# Patient Record
Sex: Female | Born: 1945 | Race: White | Hispanic: No | Marital: Married | Smoking: Never smoker
Health system: Southern US, Community
[De-identification: ages and names within clinical notes are randomized; demographics above are authoritative.]

## PROBLEM LIST (undated history)

## (undated) DIAGNOSIS — C801 Malignant (primary) neoplasm, unspecified: Secondary | ICD-10-CM

## (undated) DIAGNOSIS — M810 Age-related osteoporosis without current pathological fracture: Secondary | ICD-10-CM

## (undated) DIAGNOSIS — D649 Anemia, unspecified: Secondary | ICD-10-CM

## (undated) DIAGNOSIS — E785 Hyperlipidemia, unspecified: Secondary | ICD-10-CM

## (undated) DIAGNOSIS — L570 Actinic keratosis: Secondary | ICD-10-CM

## (undated) DIAGNOSIS — I10 Essential (primary) hypertension: Secondary | ICD-10-CM

## (undated) DIAGNOSIS — H409 Unspecified glaucoma: Secondary | ICD-10-CM

## (undated) HISTORY — DX: Unspecified glaucoma: H40.9

## (undated) HISTORY — DX: Age-related osteoporosis without current pathological fracture: M81.0

## (undated) HISTORY — PX: OTHER SURGICAL HISTORY: SHX169

## (undated) HISTORY — DX: Malignant (primary) neoplasm, unspecified: C80.1

## (undated) HISTORY — DX: Actinic keratosis: L57.0

## (undated) HISTORY — PX: COLONOSCOPY: SHX174

---

## 1970-08-18 HISTORY — PX: BREAST EXCISIONAL BIOPSY: SUR124

## 1978-08-18 HISTORY — PX: BREAST EXCISIONAL BIOPSY: SUR124

## 2004-05-27 ENCOUNTER — Ambulatory Visit: Payer: Self-pay | Admitting: Unknown Physician Specialty

## 2004-05-29 ENCOUNTER — Ambulatory Visit: Payer: Self-pay | Admitting: Unknown Physician Specialty

## 2004-12-25 ENCOUNTER — Ambulatory Visit: Payer: Self-pay | Admitting: Gynecologic Oncology

## 2005-05-30 ENCOUNTER — Ambulatory Visit: Payer: Self-pay | Admitting: Unknown Physician Specialty

## 2006-07-17 ENCOUNTER — Ambulatory Visit: Payer: Self-pay | Admitting: Unknown Physician Specialty

## 2007-07-28 ENCOUNTER — Ambulatory Visit: Payer: Self-pay | Admitting: Unknown Physician Specialty

## 2007-12-03 ENCOUNTER — Ambulatory Visit: Payer: Self-pay | Admitting: Unknown Physician Specialty

## 2007-12-08 DIAGNOSIS — C4492 Squamous cell carcinoma of skin, unspecified: Secondary | ICD-10-CM

## 2007-12-08 HISTORY — DX: Squamous cell carcinoma of skin, unspecified: C44.92

## 2007-12-23 DIAGNOSIS — C4491 Basal cell carcinoma of skin, unspecified: Secondary | ICD-10-CM

## 2007-12-23 HISTORY — DX: Basal cell carcinoma of skin, unspecified: C44.91

## 2008-07-28 ENCOUNTER — Ambulatory Visit: Payer: Self-pay | Admitting: Unknown Physician Specialty

## 2009-07-31 ENCOUNTER — Ambulatory Visit: Payer: Self-pay | Admitting: Unknown Physician Specialty

## 2010-04-09 ENCOUNTER — Ambulatory Visit: Payer: Self-pay | Admitting: Internal Medicine

## 2010-08-13 ENCOUNTER — Ambulatory Visit: Payer: Self-pay | Admitting: Unknown Physician Specialty

## 2011-08-20 ENCOUNTER — Ambulatory Visit: Payer: Self-pay | Admitting: Unknown Physician Specialty

## 2012-08-20 ENCOUNTER — Ambulatory Visit: Payer: Self-pay | Admitting: Internal Medicine

## 2013-08-22 ENCOUNTER — Ambulatory Visit: Payer: Self-pay | Admitting: Internal Medicine

## 2014-09-11 ENCOUNTER — Ambulatory Visit: Payer: Self-pay | Admitting: Internal Medicine

## 2015-04-06 ENCOUNTER — Encounter: Payer: Self-pay | Admitting: *Deleted

## 2015-04-09 ENCOUNTER — Encounter: Payer: Self-pay | Admitting: *Deleted

## 2015-04-09 ENCOUNTER — Encounter
Admission: RE | Disposition: A | Discharge status: Home / Self Care | Payer: Self-pay | Source: Ambulatory Visit | Attending: Unknown Physician Specialty

## 2015-04-09 ENCOUNTER — Ambulatory Visit: Payer: PPO | Admitting: Anesthesiology

## 2015-04-09 ENCOUNTER — Ambulatory Visit
Admission: RE | Admit: 2015-04-09 | Discharge: 2015-04-09 | Disposition: A | Discharge status: Home / Self Care | Payer: PPO | Source: Ambulatory Visit | Attending: Unknown Physician Specialty | Admitting: Unknown Physician Specialty

## 2015-04-09 DIAGNOSIS — R011 Cardiac murmur, unspecified: Secondary | ICD-10-CM | POA: Insufficient documentation

## 2015-04-09 DIAGNOSIS — D510 Vitamin B12 deficiency anemia due to intrinsic factor deficiency: Secondary | ICD-10-CM | POA: Insufficient documentation

## 2015-04-09 DIAGNOSIS — K2950 Unspecified chronic gastritis without bleeding: Secondary | ICD-10-CM | POA: Diagnosis not present

## 2015-04-09 DIAGNOSIS — E785 Hyperlipidemia, unspecified: Secondary | ICD-10-CM | POA: Insufficient documentation

## 2015-04-09 DIAGNOSIS — K449 Diaphragmatic hernia without obstruction or gangrene: Secondary | ICD-10-CM | POA: Diagnosis not present

## 2015-04-09 DIAGNOSIS — K317 Polyp of stomach and duodenum: Secondary | ICD-10-CM | POA: Diagnosis not present

## 2015-04-09 DIAGNOSIS — Z79899 Other long term (current) drug therapy: Secondary | ICD-10-CM | POA: Insufficient documentation

## 2015-04-09 DIAGNOSIS — I10 Essential (primary) hypertension: Secondary | ICD-10-CM | POA: Diagnosis not present

## 2015-04-09 HISTORY — DX: Essential (primary) hypertension: I10

## 2015-04-09 HISTORY — DX: Anemia, unspecified: D64.9

## 2015-04-09 HISTORY — DX: Hyperlipidemia, unspecified: E78.5

## 2015-04-09 HISTORY — PX: ESOPHAGOGASTRODUODENOSCOPY (EGD) WITH PROPOFOL: SHX5813

## 2015-04-09 SURGERY — ESOPHAGOGASTRODUODENOSCOPY (EGD) WITH PROPOFOL
Anesthesia: General

## 2015-04-09 MED ORDER — PROPOFOL 10 MG/ML IV BOLUS
INTRAVENOUS | Status: DC | PRN
  Administered 2015-04-09: 50 mg via INTRAVENOUS

## 2015-04-09 MED ORDER — SODIUM CHLORIDE 0.9 % IV SOLN
INTRAVENOUS | Status: DC
  Administered 2015-04-09: 1000 mL via INTRAVENOUS

## 2015-04-09 MED ORDER — SODIUM CHLORIDE 0.9 % IV SOLN: INTRAVENOUS | Status: DC

## 2015-04-09 MED ORDER — PROPOFOL INFUSION 10 MG/ML OPTIME
INTRAVENOUS | Status: DC | PRN
  Administered 2015-04-09: 100 ug/kg/min via INTRAVENOUS

## 2015-04-09 NOTE — H&P (Signed)
   Primary Care Physician:  Lavera Guise, MD Primary Gastroenterologist:  Dr. Vira Agar  Pre-Procedure History & Physical: HPI:  Joann Warren is a 69 y.o. female is here for an endoscopy.   Past Medical History  Diagnosis Date  . Anemia   . Hypertension   . Hyperlipidemia     Past Surgical History  Procedure Laterality Date  . Colonoscopy      Prior to Admission medications   Medication Sig Start Date End Date Taking? Authorizing Provider  amLODipine (NORVASC) 5 MG tablet Take 5 mg by mouth daily.   Yes Historical Provider, MD  bimatoprost (LATISSE) 0.03 % ophthalmic solution Place into both eyes at bedtime. Place one drop on applicator and apply evenly along the skin of the upper eyelid at base of eyelashes once daily at bedtime; repeat procedure for second eye (use a clean applicator).   Yes Historical Provider, MD  bimatoprost (LUMIGAN) 0.03 % ophthalmic solution 1 drop at bedtime.   Yes Historical Provider, MD  bisoprolol-hydrochlorothiazide (ZIAC) 2.5-6.25 MG per tablet Take 1 tablet by mouth daily.   Yes Historical Provider, MD  ibandronate (BONIVA) 150 MG tablet Take 150 mg by mouth every 30 (thirty) days. Take in the morning with a full glass of water, on an empty stomach, and do not take anything else by mouth or lie down for the next 30 min.   Yes Historical Provider, MD  simvastatin (ZOCOR) 20 MG tablet Take 20 mg by mouth daily.   Yes Historical Provider, MD  vitamin B-12 (CYANOCOBALAMIN) 1000 MCG tablet Take 1,000 mcg by mouth daily.   Yes Historical Provider, MD    Allergies as of 03/13/2015  . (Not on File)    History reviewed. No pertinent family history.  Social History   Social History  . Marital Status: Married    Spouse Name: N/A  . Number of Children: N/A  . Years of Education: N/A   Occupational History  . Not on file.   Social History Main Topics  . Smoking status: Never Smoker   . Smokeless tobacco: Not on file  . Alcohol Use: Not on file   . Drug Use: Not on file  . Sexual Activity: Not on file   Other Topics Concern  . Not on file   Social History Narrative    Review of Systems: See HPI, otherwise negative ROS  Physical Exam: BP 126/67 mmHg  Pulse 84  Temp(Src) 97 F (36.1 C) (Tympanic)  Resp 16  Ht 5\' 2"  (1.575 m)  Wt 45.36 kg (100 lb)  BMI 18.29 kg/m2  SpO2 100% General:   Alert,  pleasant and cooperative in NAD Head:  Normocephalic and atraumatic. Neck:  Supple; no masses or thyromegaly. Lungs:  Clear throughout to auscultation.    Heart:  Regular rate and rhythm. Abdomen:  Soft, nontender and nondistended. Normal bowel sounds, without guarding, and without rebound.   Neurologic:  Alert and  oriented x4;  grossly normal neurologically.  Impression/Plan: Loyal Jacobson Warren is here for an endoscopy to be performed for pernicious anemia  Risks, benefits, limitations, and alternatives regarding  endoscopy have been reviewed with the patient.  Questions have been answered.  All parties agreeable.   Gaylyn Cheers, MD  04/09/2015, 8:37 AM

## 2015-04-09 NOTE — Transfer of Care (Signed)
Immediate Anesthesia Transfer of Care Note  Patient: Joann Warren  Procedure(s) Performed: Procedure(s): ESOPHAGOGASTRODUODENOSCOPY (EGD) WITH PROPOFOL (N/A)  Patient Location: PACU  Anesthesia Type:General  Level of Consciousness: awake  Airway & Oxygen Therapy: Patient connected to nasal cannula oxygen  Post-op Assessment: Report given to RN  Post vital signs: stable  Last Vitals:  Filed Vitals:   04/09/15 0810  BP: 126/67  Pulse: 84  Temp: 36.1 C  Resp: 16    Complications: No apparent anesthesia complications

## 2015-04-09 NOTE — Op Note (Signed)
[facility] Gastroenterology Patient Name: Joann Warren Procedure Date: 04/09/2015 8:33 AM MRN: [id number] Account #: [id number] Date of Birth: September 11, 1945 Admit Type: Outpatient Age: 69 Room: [facility] ENDO ROOM 1 Gender: Female Note Status: Finalized Procedure:         Upper GI endoscopy Indications:       Vitamin B12 deficiency anemia Providers:         Manya Silvas, MD Referring MD:      Perrin Maltese, MD (Referring MD) Medicines:         Propofol per Anesthesia Complications:     No immediate complications. Procedure:         Pre-Anesthesia Assessment:                    - After reviewing the risks and benefits, the patient was                     deemed in satisfactory condition to undergo the procedure.                    After obtaining informed consent, the endoscope was passed                     under direct vision. Throughout the procedure, the                     patient's blood pressure, pulse, and oxygen saturations                     were monitored continuously. The Endoscope was introduced                     through the mouth, and advanced to the second part of                     duodenum. The upper GI endoscopy was accomplished without                     difficulty. The patient tolerated the procedure well. Findings:      The examined esophagus was normal.      Multiple diminutive sessile polyps with no bleeding and no stigmata of       recent bleeding were found in the gastric body.      for perrnicious anemia      Normal mucosa was found in the gastric body and in the gastric antrum.       Biopsies were taken with a cold forceps for histology.      The duodenal bulb and 2nd part of the duodenum were normal. Biopsies       were taken with a cold forceps for histology.      A small hiatus hernia was present. 2cm from 36 to 38 cm. Impression:        - Normal esophagus.                    - Multiple gastric polyps.                    -  Normal mucosa was found in the gastric body and in the                     antrum. Biopsied.                    -  Normal duodenal bulb and 2nd part of the duodenum.                     Biopsied.                    - Small hiatus hernia. Recommendation:    - Await pathology results. Manya Silvas, MD 04/09/2015 8:51:55 AM This report has been signed electronically. Number of Addenda: 0 Note Initiated On: 04/09/2015 8:33 AM      [facility]

## 2015-04-09 NOTE — Anesthesia Preprocedure Evaluation (Signed)
Anesthesia Evaluation  Patient identified by MRN, date of birth, ID band Patient awake    Reviewed: Allergy & Precautions, NPO status , Patient's Chart, lab work & pertinent test results  History of Anesthesia Complications Negative for: history of anesthetic complications  Airway Mallampati: II  TM Distance: >3 FB Neck ROM: Full    Dental  (+) Teeth Intact   Pulmonary neg pulmonary ROS,          Cardiovascular hypertension, Pt. on medications + Valvular Problems/Murmurs (murmur, no tx)     Neuro/Psych negative neurological ROS     GI/Hepatic   Endo/Other    Renal/GU      Musculoskeletal   Abdominal   Peds  Hematology  (+) Blood dyscrasia (B12 def.), anemia ,   Anesthesia Other Findings   Reproductive/Obstetrics                             Anesthesia Physical Anesthesia Plan  ASA: II  Anesthesia Plan: General   Post-op Pain Management:    Induction: Intravenous  Airway Management Planned: Nasal Cannula  Additional Equipment:   Intra-op Plan:   Post-operative Plan:   Informed Consent: I have reviewed the patients History and Physical, chart, labs and discussed the procedure including the risks, benefits and alternatives for the proposed anesthesia with the patient or authorized representative who has indicated his/her understanding and acceptance.     Plan Discussed with:   Anesthesia Plan Comments:         Anesthesia Quick Evaluation

## 2015-04-09 NOTE — Anesthesia Postprocedure Evaluation (Signed)
  Anesthesia Post-op Note  Patient: Joann Warren  Procedure(s) Performed: Procedure(s): ESOPHAGOGASTRODUODENOSCOPY (EGD) WITH PROPOFOL (N/A)  Anesthesia type:General  Patient location: PACU  Post pain: Pain level controlled  Post assessment: Post-op Vital signs reviewed, Patient's Cardiovascular Status Stable, Respiratory Function Stable, Patent Airway and No signs of Nausea or vomiting  Post vital signs: Reviewed and stable  Last Vitals:  Filed Vitals:   04/09/15 0853  BP: 92/51  Pulse: 62  Temp: 36.1 C  Resp: 17    Level of consciousness: awake, alert  and patient cooperative  Complications: No apparent anesthesia complications

## 2015-04-10 LAB — SURGICAL PATHOLOGY

## 2015-04-11 ENCOUNTER — Encounter: Payer: Self-pay | Admitting: Unknown Physician Specialty

## 2015-07-24 ENCOUNTER — Other Ambulatory Visit: Payer: Self-pay | Admitting: Internal Medicine

## 2015-07-24 DIAGNOSIS — Z1231 Encounter for screening mammogram for malignant neoplasm of breast: Secondary | ICD-10-CM

## 2015-08-24 DIAGNOSIS — D519 Vitamin B12 deficiency anemia, unspecified: Secondary | ICD-10-CM | POA: Diagnosis not present

## 2015-09-12 DIAGNOSIS — L570 Actinic keratosis: Secondary | ICD-10-CM | POA: Diagnosis not present

## 2015-09-12 DIAGNOSIS — C44229 Squamous cell carcinoma of skin of left ear and external auricular canal: Secondary | ICD-10-CM | POA: Diagnosis not present

## 2015-09-12 DIAGNOSIS — D485 Neoplasm of uncertain behavior of skin: Secondary | ICD-10-CM | POA: Diagnosis not present

## 2015-09-12 DIAGNOSIS — Z85828 Personal history of other malignant neoplasm of skin: Secondary | ICD-10-CM | POA: Diagnosis not present

## 2015-09-12 DIAGNOSIS — C4442 Squamous cell carcinoma of skin of scalp and neck: Secondary | ICD-10-CM | POA: Diagnosis not present

## 2015-09-12 DIAGNOSIS — L578 Other skin changes due to chronic exposure to nonionizing radiation: Secondary | ICD-10-CM | POA: Diagnosis not present

## 2015-09-13 ENCOUNTER — Ambulatory Visit
Admission: RE | Admit: 2015-09-13 | Discharge: 2015-09-13 | Disposition: A | Discharge status: Home / Self Care | Payer: PPO | Source: Ambulatory Visit | Attending: Internal Medicine | Admitting: Internal Medicine

## 2015-09-13 ENCOUNTER — Other Ambulatory Visit: Payer: Self-pay | Admitting: Internal Medicine

## 2015-09-13 DIAGNOSIS — Z1231 Encounter for screening mammogram for malignant neoplasm of breast: Secondary | ICD-10-CM | POA: Diagnosis not present

## 2015-09-17 DIAGNOSIS — D519 Vitamin B12 deficiency anemia, unspecified: Secondary | ICD-10-CM | POA: Diagnosis not present

## 2015-10-11 DIAGNOSIS — D519 Vitamin B12 deficiency anemia, unspecified: Secondary | ICD-10-CM | POA: Diagnosis not present

## 2015-11-08 DIAGNOSIS — D519 Vitamin B12 deficiency anemia, unspecified: Secondary | ICD-10-CM | POA: Diagnosis not present

## 2015-12-10 DIAGNOSIS — D519 Vitamin B12 deficiency anemia, unspecified: Secondary | ICD-10-CM | POA: Diagnosis not present

## 2015-12-12 DIAGNOSIS — D485 Neoplasm of uncertain behavior of skin: Secondary | ICD-10-CM | POA: Diagnosis not present

## 2015-12-12 DIAGNOSIS — L578 Other skin changes due to chronic exposure to nonionizing radiation: Secondary | ICD-10-CM | POA: Diagnosis not present

## 2015-12-12 DIAGNOSIS — Z85828 Personal history of other malignant neoplasm of skin: Secondary | ICD-10-CM | POA: Diagnosis not present

## 2015-12-12 DIAGNOSIS — C44729 Squamous cell carcinoma of skin of left lower limb, including hip: Secondary | ICD-10-CM | POA: Diagnosis not present

## 2015-12-21 DIAGNOSIS — H401112 Primary open-angle glaucoma, right eye, moderate stage: Secondary | ICD-10-CM | POA: Diagnosis not present

## 2015-12-31 DIAGNOSIS — D485 Neoplasm of uncertain behavior of skin: Secondary | ICD-10-CM | POA: Diagnosis not present

## 2015-12-31 DIAGNOSIS — Z85828 Personal history of other malignant neoplasm of skin: Secondary | ICD-10-CM | POA: Diagnosis not present

## 2015-12-31 DIAGNOSIS — C44721 Squamous cell carcinoma of skin of unspecified lower limb, including hip: Secondary | ICD-10-CM | POA: Diagnosis not present

## 2015-12-31 DIAGNOSIS — C44729 Squamous cell carcinoma of skin of left lower limb, including hip: Secondary | ICD-10-CM | POA: Diagnosis not present

## 2015-12-31 DIAGNOSIS — D0472 Carcinoma in situ of skin of left lower limb, including hip: Secondary | ICD-10-CM | POA: Diagnosis not present

## 2015-12-31 DIAGNOSIS — C44722 Squamous cell carcinoma of skin of right lower limb, including hip: Secondary | ICD-10-CM | POA: Diagnosis not present

## 2015-12-31 DIAGNOSIS — L570 Actinic keratosis: Secondary | ICD-10-CM | POA: Diagnosis not present

## 2015-12-31 DIAGNOSIS — L578 Other skin changes due to chronic exposure to nonionizing radiation: Secondary | ICD-10-CM | POA: Diagnosis not present

## 2016-01-07 DIAGNOSIS — D519 Vitamin B12 deficiency anemia, unspecified: Secondary | ICD-10-CM | POA: Diagnosis not present

## 2016-01-22 DIAGNOSIS — D519 Vitamin B12 deficiency anemia, unspecified: Secondary | ICD-10-CM | POA: Diagnosis not present

## 2016-01-22 DIAGNOSIS — I10 Essential (primary) hypertension: Secondary | ICD-10-CM | POA: Diagnosis not present

## 2016-01-22 DIAGNOSIS — E782 Mixed hyperlipidemia: Secondary | ICD-10-CM | POA: Diagnosis not present

## 2016-01-22 DIAGNOSIS — Z0001 Encounter for general adult medical examination with abnormal findings: Secondary | ICD-10-CM | POA: Diagnosis not present

## 2016-01-22 DIAGNOSIS — C44229 Squamous cell carcinoma of skin of left ear and external auricular canal: Secondary | ICD-10-CM | POA: Diagnosis not present

## 2016-01-31 DIAGNOSIS — C44722 Squamous cell carcinoma of skin of right lower limb, including hip: Secondary | ICD-10-CM | POA: Diagnosis not present

## 2016-01-31 DIAGNOSIS — D485 Neoplasm of uncertain behavior of skin: Secondary | ICD-10-CM | POA: Diagnosis not present

## 2016-01-31 DIAGNOSIS — Z85828 Personal history of other malignant neoplasm of skin: Secondary | ICD-10-CM | POA: Diagnosis not present

## 2016-01-31 DIAGNOSIS — L281 Prurigo nodularis: Secondary | ICD-10-CM | POA: Diagnosis not present

## 2016-01-31 DIAGNOSIS — C44729 Squamous cell carcinoma of skin of left lower limb, including hip: Secondary | ICD-10-CM | POA: Diagnosis not present

## 2016-01-31 DIAGNOSIS — L570 Actinic keratosis: Secondary | ICD-10-CM | POA: Diagnosis not present

## 2016-01-31 DIAGNOSIS — L578 Other skin changes due to chronic exposure to nonionizing radiation: Secondary | ICD-10-CM | POA: Diagnosis not present

## 2016-02-05 DIAGNOSIS — I10 Essential (primary) hypertension: Secondary | ICD-10-CM | POA: Diagnosis not present

## 2016-02-05 DIAGNOSIS — C44229 Squamous cell carcinoma of skin of left ear and external auricular canal: Secondary | ICD-10-CM | POA: Diagnosis not present

## 2016-02-05 DIAGNOSIS — D519 Vitamin B12 deficiency anemia, unspecified: Secondary | ICD-10-CM | POA: Diagnosis not present

## 2016-02-05 DIAGNOSIS — E782 Mixed hyperlipidemia: Secondary | ICD-10-CM | POA: Diagnosis not present

## 2016-02-05 DIAGNOSIS — M818 Other osteoporosis without current pathological fracture: Secondary | ICD-10-CM | POA: Diagnosis not present

## 2016-03-05 DIAGNOSIS — D519 Vitamin B12 deficiency anemia, unspecified: Secondary | ICD-10-CM | POA: Diagnosis not present

## 2016-03-17 DIAGNOSIS — L570 Actinic keratosis: Secondary | ICD-10-CM | POA: Diagnosis not present

## 2016-03-17 DIAGNOSIS — Z85828 Personal history of other malignant neoplasm of skin: Secondary | ICD-10-CM | POA: Diagnosis not present

## 2016-03-17 DIAGNOSIS — C44729 Squamous cell carcinoma of skin of left lower limb, including hip: Secondary | ICD-10-CM | POA: Diagnosis not present

## 2016-03-17 DIAGNOSIS — C44722 Squamous cell carcinoma of skin of right lower limb, including hip: Secondary | ICD-10-CM | POA: Diagnosis not present

## 2016-03-17 DIAGNOSIS — D485 Neoplasm of uncertain behavior of skin: Secondary | ICD-10-CM | POA: Diagnosis not present

## 2016-03-17 DIAGNOSIS — L578 Other skin changes due to chronic exposure to nonionizing radiation: Secondary | ICD-10-CM | POA: Diagnosis not present

## 2016-03-26 DIAGNOSIS — I350 Nonrheumatic aortic (valve) stenosis: Secondary | ICD-10-CM | POA: Diagnosis not present

## 2016-05-07 DIAGNOSIS — D519 Vitamin B12 deficiency anemia, unspecified: Secondary | ICD-10-CM | POA: Diagnosis not present

## 2016-05-21 DIAGNOSIS — C44722 Squamous cell carcinoma of skin of right lower limb, including hip: Secondary | ICD-10-CM | POA: Diagnosis not present

## 2016-05-21 DIAGNOSIS — D485 Neoplasm of uncertain behavior of skin: Secondary | ICD-10-CM | POA: Diagnosis not present

## 2016-05-21 DIAGNOSIS — L820 Inflamed seborrheic keratosis: Secondary | ICD-10-CM | POA: Diagnosis not present

## 2016-05-21 DIAGNOSIS — L570 Actinic keratosis: Secondary | ICD-10-CM | POA: Diagnosis not present

## 2016-05-21 DIAGNOSIS — L578 Other skin changes due to chronic exposure to nonionizing radiation: Secondary | ICD-10-CM | POA: Diagnosis not present

## 2016-05-21 DIAGNOSIS — L821 Other seborrheic keratosis: Secondary | ICD-10-CM | POA: Diagnosis not present

## 2016-05-21 DIAGNOSIS — Z85828 Personal history of other malignant neoplasm of skin: Secondary | ICD-10-CM | POA: Diagnosis not present

## 2016-05-21 DIAGNOSIS — C44511 Basal cell carcinoma of skin of breast: Secondary | ICD-10-CM | POA: Diagnosis not present

## 2016-06-18 DIAGNOSIS — H401112 Primary open-angle glaucoma, right eye, moderate stage: Secondary | ICD-10-CM | POA: Diagnosis not present

## 2016-06-26 DIAGNOSIS — H401112 Primary open-angle glaucoma, right eye, moderate stage: Secondary | ICD-10-CM | POA: Diagnosis not present

## 2016-07-03 DIAGNOSIS — L820 Inflamed seborrheic keratosis: Secondary | ICD-10-CM | POA: Diagnosis not present

## 2016-07-03 DIAGNOSIS — L570 Actinic keratosis: Secondary | ICD-10-CM | POA: Diagnosis not present

## 2016-07-03 DIAGNOSIS — C44511 Basal cell carcinoma of skin of breast: Secondary | ICD-10-CM | POA: Diagnosis not present

## 2016-07-03 DIAGNOSIS — Z85828 Personal history of other malignant neoplasm of skin: Secondary | ICD-10-CM | POA: Diagnosis not present

## 2016-07-03 DIAGNOSIS — L578 Other skin changes due to chronic exposure to nonionizing radiation: Secondary | ICD-10-CM | POA: Diagnosis not present

## 2016-07-03 DIAGNOSIS — C44722 Squamous cell carcinoma of skin of right lower limb, including hip: Secondary | ICD-10-CM | POA: Diagnosis not present

## 2016-07-03 DIAGNOSIS — D485 Neoplasm of uncertain behavior of skin: Secondary | ICD-10-CM | POA: Diagnosis not present

## 2016-07-07 DIAGNOSIS — D519 Vitamin B12 deficiency anemia, unspecified: Secondary | ICD-10-CM | POA: Diagnosis not present

## 2016-07-24 DIAGNOSIS — D519 Vitamin B12 deficiency anemia, unspecified: Secondary | ICD-10-CM | POA: Diagnosis not present

## 2016-07-24 DIAGNOSIS — E782 Mixed hyperlipidemia: Secondary | ICD-10-CM | POA: Diagnosis not present

## 2016-07-24 DIAGNOSIS — I350 Nonrheumatic aortic (valve) stenosis: Secondary | ICD-10-CM | POA: Diagnosis not present

## 2016-07-24 DIAGNOSIS — M810 Age-related osteoporosis without current pathological fracture: Secondary | ICD-10-CM | POA: Diagnosis not present

## 2016-07-24 DIAGNOSIS — I10 Essential (primary) hypertension: Secondary | ICD-10-CM | POA: Diagnosis not present

## 2016-07-29 ENCOUNTER — Other Ambulatory Visit: Payer: Self-pay | Admitting: Nurse Practitioner

## 2016-07-29 DIAGNOSIS — Z1231 Encounter for screening mammogram for malignant neoplasm of breast: Secondary | ICD-10-CM

## 2016-07-29 DIAGNOSIS — M810 Age-related osteoporosis without current pathological fracture: Secondary | ICD-10-CM

## 2016-08-20 DIAGNOSIS — C44722 Squamous cell carcinoma of skin of right lower limb, including hip: Secondary | ICD-10-CM | POA: Diagnosis not present

## 2016-08-20 DIAGNOSIS — L820 Inflamed seborrheic keratosis: Secondary | ICD-10-CM | POA: Diagnosis not present

## 2016-08-20 DIAGNOSIS — D485 Neoplasm of uncertain behavior of skin: Secondary | ICD-10-CM | POA: Diagnosis not present

## 2016-08-20 DIAGNOSIS — L812 Freckles: Secondary | ICD-10-CM | POA: Diagnosis not present

## 2016-08-20 DIAGNOSIS — Z85828 Personal history of other malignant neoplasm of skin: Secondary | ICD-10-CM | POA: Diagnosis not present

## 2016-08-20 DIAGNOSIS — C44529 Squamous cell carcinoma of skin of other part of trunk: Secondary | ICD-10-CM | POA: Diagnosis not present

## 2016-08-20 DIAGNOSIS — D229 Melanocytic nevi, unspecified: Secondary | ICD-10-CM | POA: Diagnosis not present

## 2016-08-20 DIAGNOSIS — Z1283 Encounter for screening for malignant neoplasm of skin: Secondary | ICD-10-CM | POA: Diagnosis not present

## 2016-08-20 DIAGNOSIS — L570 Actinic keratosis: Secondary | ICD-10-CM | POA: Diagnosis not present

## 2016-08-20 DIAGNOSIS — L821 Other seborrheic keratosis: Secondary | ICD-10-CM | POA: Diagnosis not present

## 2016-08-20 DIAGNOSIS — D0472 Carcinoma in situ of skin of left lower limb, including hip: Secondary | ICD-10-CM | POA: Diagnosis not present

## 2016-08-20 DIAGNOSIS — L578 Other skin changes due to chronic exposure to nonionizing radiation: Secondary | ICD-10-CM | POA: Diagnosis not present

## 2016-09-05 DIAGNOSIS — D519 Vitamin B12 deficiency anemia, unspecified: Secondary | ICD-10-CM | POA: Diagnosis not present

## 2016-09-15 ENCOUNTER — Ambulatory Visit
Admission: RE | Admit: 2016-09-15 | Discharge: 2016-09-15 | Disposition: A | Discharge status: Home / Self Care | Payer: PPO | Source: Ambulatory Visit | Attending: Nurse Practitioner | Admitting: Nurse Practitioner

## 2016-09-15 ENCOUNTER — Other Ambulatory Visit: Payer: Self-pay | Admitting: Nurse Practitioner

## 2016-09-15 DIAGNOSIS — M810 Age-related osteoporosis without current pathological fracture: Secondary | ICD-10-CM | POA: Diagnosis not present

## 2016-09-15 DIAGNOSIS — Z1231 Encounter for screening mammogram for malignant neoplasm of breast: Secondary | ICD-10-CM | POA: Insufficient documentation

## 2016-09-15 DIAGNOSIS — M85852 Other specified disorders of bone density and structure, left thigh: Secondary | ICD-10-CM | POA: Insufficient documentation

## 2016-09-17 ENCOUNTER — Other Ambulatory Visit: Payer: Self-pay | Admitting: Nurse Practitioner

## 2016-09-17 DIAGNOSIS — N6489 Other specified disorders of breast: Secondary | ICD-10-CM

## 2016-09-17 DIAGNOSIS — R928 Other abnormal and inconclusive findings on diagnostic imaging of breast: Secondary | ICD-10-CM

## 2016-10-02 ENCOUNTER — Ambulatory Visit
Admission: RE | Admit: 2016-10-02 | Discharge: 2016-10-02 | Disposition: A | Discharge status: Home / Self Care | Payer: PPO | Source: Ambulatory Visit | Attending: Nurse Practitioner | Admitting: Nurse Practitioner

## 2016-10-02 DIAGNOSIS — N649 Disorder of breast, unspecified: Secondary | ICD-10-CM | POA: Insufficient documentation

## 2016-10-02 DIAGNOSIS — R928 Other abnormal and inconclusive findings on diagnostic imaging of breast: Secondary | ICD-10-CM

## 2016-10-02 DIAGNOSIS — N6489 Other specified disorders of breast: Secondary | ICD-10-CM

## 2016-10-30 DIAGNOSIS — C44722 Squamous cell carcinoma of skin of right lower limb, including hip: Secondary | ICD-10-CM | POA: Diagnosis not present

## 2016-10-30 DIAGNOSIS — L820 Inflamed seborrheic keratosis: Secondary | ICD-10-CM | POA: Diagnosis not present

## 2016-10-30 DIAGNOSIS — Z85828 Personal history of other malignant neoplasm of skin: Secondary | ICD-10-CM | POA: Diagnosis not present

## 2016-10-30 DIAGNOSIS — D485 Neoplasm of uncertain behavior of skin: Secondary | ICD-10-CM | POA: Diagnosis not present

## 2016-12-02 DIAGNOSIS — D519 Vitamin B12 deficiency anemia, unspecified: Secondary | ICD-10-CM | POA: Diagnosis not present

## 2016-12-10 DIAGNOSIS — C44729 Squamous cell carcinoma of skin of left lower limb, including hip: Secondary | ICD-10-CM | POA: Diagnosis not present

## 2016-12-10 DIAGNOSIS — L570 Actinic keratosis: Secondary | ICD-10-CM | POA: Diagnosis not present

## 2016-12-10 DIAGNOSIS — L578 Other skin changes due to chronic exposure to nonionizing radiation: Secondary | ICD-10-CM | POA: Diagnosis not present

## 2016-12-10 DIAGNOSIS — D0472 Carcinoma in situ of skin of left lower limb, including hip: Secondary | ICD-10-CM | POA: Diagnosis not present

## 2016-12-10 DIAGNOSIS — C44529 Squamous cell carcinoma of skin of other part of trunk: Secondary | ICD-10-CM | POA: Diagnosis not present

## 2016-12-10 DIAGNOSIS — D485 Neoplasm of uncertain behavior of skin: Secondary | ICD-10-CM | POA: Diagnosis not present

## 2016-12-25 DIAGNOSIS — Z85828 Personal history of other malignant neoplasm of skin: Secondary | ICD-10-CM | POA: Diagnosis not present

## 2016-12-25 DIAGNOSIS — C44722 Squamous cell carcinoma of skin of right lower limb, including hip: Secondary | ICD-10-CM | POA: Diagnosis not present

## 2016-12-25 DIAGNOSIS — D485 Neoplasm of uncertain behavior of skin: Secondary | ICD-10-CM | POA: Diagnosis not present

## 2016-12-25 DIAGNOSIS — H401112 Primary open-angle glaucoma, right eye, moderate stage: Secondary | ICD-10-CM | POA: Diagnosis not present

## 2016-12-25 DIAGNOSIS — L578 Other skin changes due to chronic exposure to nonionizing radiation: Secondary | ICD-10-CM | POA: Diagnosis not present

## 2017-01-02 DIAGNOSIS — D519 Vitamin B12 deficiency anemia, unspecified: Secondary | ICD-10-CM | POA: Diagnosis not present

## 2017-01-06 ENCOUNTER — Institutional Professional Consult (permissible substitution): Payer: PPO | Admitting: Radiation Oncology

## 2017-01-13 ENCOUNTER — Encounter: Payer: Self-pay | Admitting: Radiation Oncology

## 2017-01-13 ENCOUNTER — Ambulatory Visit
Admission: RE | Admit: 2017-01-13 | Discharge: 2017-01-13 | Disposition: A | Discharge status: Home / Self Care | Payer: PPO | Source: Ambulatory Visit | Attending: Radiation Oncology | Admitting: Radiation Oncology

## 2017-01-13 VITALS — BP 144/73 | HR 65 | Temp 95.8°F | Resp 18 | Wt 101.4 lb

## 2017-01-13 DIAGNOSIS — Z510 Encounter for antineoplastic radiation therapy: Secondary | ICD-10-CM | POA: Diagnosis not present

## 2017-01-13 DIAGNOSIS — I10 Essential (primary) hypertension: Secondary | ICD-10-CM | POA: Diagnosis not present

## 2017-01-13 DIAGNOSIS — E785 Hyperlipidemia, unspecified: Secondary | ICD-10-CM | POA: Diagnosis not present

## 2017-01-13 DIAGNOSIS — D649 Anemia, unspecified: Secondary | ICD-10-CM | POA: Diagnosis not present

## 2017-01-13 DIAGNOSIS — C44722 Squamous cell carcinoma of skin of right lower limb, including hip: Secondary | ICD-10-CM | POA: Diagnosis not present

## 2017-01-13 DIAGNOSIS — Z803 Family history of malignant neoplasm of breast: Secondary | ICD-10-CM | POA: Diagnosis not present

## 2017-01-13 DIAGNOSIS — C44721 Squamous cell carcinoma of skin of unspecified lower limb, including hip: Secondary | ICD-10-CM

## 2017-01-13 DIAGNOSIS — Z79899 Other long term (current) drug therapy: Secondary | ICD-10-CM | POA: Diagnosis not present

## 2017-01-13 NOTE — Consult Note (Signed)
NEW PATIENT EVALUATION  Name: Joann Warren  MRN: [id number]  Date:   01/13/2017     DOB: [birth year]   This 71 y.o. 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  REFERRING PHYSICIAN: Lavera Guise, MD  CHIEF COMPLAINT:  Chief Complaint  Patient presents with  . Cancer    Pt is here for initial consultation of skin cancer.      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 She recently had 3 areas on her right lower extremity all biopsied all positive for squamous cell carcinoma. She has extensive lesions on both lower extremities mostly from the knee down to right above the ankle. She still ambulates well. She is seen today for consideration of electron beam therapy.  PLANNED TREATMENT REGIMEN: Electron beam therapy to right lower extremity  PAST MEDICAL HISTORY:  has a past medical history of Anemia; Hyperlipidemia; and Hypertension.    PAST SURGICAL HISTORY:  Past Surgical History:  Procedure Laterality Date  . BREAST EXCISIONAL BIOPSY Left 1972   neg  . BREAST EXCISIONAL BIOPSY Right 1980   neg  . COLONOSCOPY    . ESOPHAGOGASTRODUODENOSCOPY (EGD) WITH PROPOFOL N/A 04/09/2015   Procedure: ESOPHAGOGASTRODUODENOSCOPY (EGD) WITH PROPOFOL;  Surgeon: Manya Silvas, MD;  Location: [facility] ENDOSCOPY;  Service: Endoscopy;  Laterality: N/A;    FAMILY HISTORY: family history includes Breast cancer (age of onset: 41) in her sister.  SOCIAL HISTORY:  reports that she has never smoked. She has never used smokeless tobacco.  ALLERGIES: Neomycin-bacitracin zn-polymyx  MEDICATIONS:  Current  Outpatient Prescriptions  Medication Sig Dispense Refill  . alendronate (FOSAMAX) 70 MG tablet TAKE ONE TAB ON EMPTY STOMACH WITH FULL GLASS OF WATER  3  . amLODipine (NORVASC) 5 MG tablet Take 5 mg by mouth daily.    . bimatoprost (LATISSE) 0.03 % ophthalmic solution Place into both eyes at bedtime. Place one drop on applicator and apply evenly along the skin of the upper eyelid at base of eyelashes once daily at bedtime; repeat procedure for second eye (use a clean applicator).    . bimatoprost (LUMIGAN) 0.03 % ophthalmic solution 1 drop at bedtime.    . bisoprolol-hydrochlorothiazide (ZIAC) 2.5-6.25 MG per tablet Take 1 tablet by mouth daily.    . simvastatin (ZOCOR) 20 MG tablet Take 20 mg by mouth daily.    . vitamin B-12 (CYANOCOBALAMIN) 1000 MCG tablet Take 1,000 mcg by mouth daily.    . fluorouracil (EFUDEX) 5 % cream     . ibandronate (BONIVA) 150 MG tablet Take 150 mg by mouth every 30 (thirty) days. Take in the morning with a full glass of water, on an empty stomach, and do not take anything else by mouth or lie down for the next 30 min.     No current facility-administered medications for this encounter.     ECOG PERFORMANCE STATUS:  0 - Asymptomatic  REVIEW OF SYSTEMS:  Patient denies any weight loss, fatigue, weakness, fever, chills or night sweats. Patient denies any loss of vision, blurred vision. Patient denies any ringing  of the ears or hearing loss. No irregular heartbeat. Patient denies heart murmur or history  of fainting. Patient denies any chest pain or pain radiating to her upper extremities. Patient denies any shortness of breath, difficulty breathing at night, cough or hemoptysis. Patient denies any swelling in the lower legs. Patient denies any nausea vomiting, vomiting of blood, or coffee ground material in the vomitus. Patient denies any stomach pain. Patient states has had normal bowel movements no significant constipation or diarrhea. Patient denies any dysuria,  hematuria or significant nocturia. Patient denies any problems walking, swelling in the joints or loss of balance. Patient denies any skin changes, loss of hair or loss of weight. Patient denies any excessive worrying or anxiety or significant depression. Patient denies any problems with insomnia. Patient denies excessive thirst, polyuria, polydipsia. Patient denies any swollen glands, patient denies easy bruising or easy bleeding. Patient denies any recent infections, allergies or URI. Patient "s visual fields have not changed significantly in recent time.    PHYSICAL EXAM: BP (!) 144/73   Pulse 65   Temp (!) 95.8 F (35.4 C)   Resp 18   Wt 101 lb 6.6 oz (46 kg)   BMI 18.55 kg/m  Patient has multiple maculopapular lesions on both lower extremities circumferential around the entire lower extremities below the knee. No evidence of inguinal adenopathy is identified. Well-developed well-nourished patient in NAD. HEENT reveals PERLA, EOMI, discs not visualized.  Oral cavity is clear. No oral mucosal lesions are identified. Neck is clear without evidence of cervical or supraclavicular adenopathy. Lungs are clear to A&P. Cardiac examination is essentially unremarkable with regular rate and rhythm without murmur rub or thrill. Abdomen is benign with no organomegaly or masses noted. Motor sensory and DTR levels are equal and symmetric in the upper and lower extremities. Cranial nerves II through XII are grossly intact. Proprioception is intact. No peripheral adenopathy or edema is identified. No motor or sensory levels are noted. Crude visual fields are within normal range.  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 I would try to incorporate as much in an  anterior strip of electron beam therapy to her right lower extremity is a test site and treat up to 5000 cGy over 5 weeks in evaluating for response. This also can be carried on her left lower extremity should we be successful. I believe we have a good chance of controlling majority of her lesions I can touch of individual spots at a future date should they progress. Risks and benefits of treatment including skin reaction fatigue all were discussed in detail with the patient. I personally set up and ordered CT simulation. I would await any other further chemotherapy wraps until we can demonstrate successful electron beam therapy for right lower extremity and could always use this again on the left lower extremity should we have a good clinical result. Patient comprehend my treatment plan well. CT scan relation appointment was given.  I would like to take this opportunity to thank you for allowing me to participate in the care of your patient.Armstead Peaks., MD

## 2017-01-19 ENCOUNTER — Ambulatory Visit: Payer: PPO

## 2017-01-19 DIAGNOSIS — L578 Other skin changes due to chronic exposure to nonionizing radiation: Secondary | ICD-10-CM | POA: Diagnosis not present

## 2017-01-19 DIAGNOSIS — C44721 Squamous cell carcinoma of skin of unspecified lower limb, including hip: Secondary | ICD-10-CM | POA: Diagnosis not present

## 2017-01-19 DIAGNOSIS — Z85828 Personal history of other malignant neoplasm of skin: Secondary | ICD-10-CM | POA: Diagnosis not present

## 2017-01-19 DIAGNOSIS — L570 Actinic keratosis: Secondary | ICD-10-CM | POA: Diagnosis not present

## 2017-01-19 DIAGNOSIS — C44629 Squamous cell carcinoma of skin of left upper limb, including shoulder: Secondary | ICD-10-CM | POA: Diagnosis not present

## 2017-01-19 DIAGNOSIS — C44619 Basal cell carcinoma of skin of left upper limb, including shoulder: Secondary | ICD-10-CM | POA: Diagnosis not present

## 2017-01-19 DIAGNOSIS — C4442 Squamous cell carcinoma of skin of scalp and neck: Secondary | ICD-10-CM | POA: Diagnosis not present

## 2017-01-19 DIAGNOSIS — D485 Neoplasm of uncertain behavior of skin: Secondary | ICD-10-CM | POA: Diagnosis not present

## 2017-01-21 ENCOUNTER — Ambulatory Visit
Admission: RE | Admit: 2017-01-21 | Discharge: 2017-01-21 | Disposition: A | Discharge status: Home / Self Care | Payer: PPO | Source: Ambulatory Visit | Attending: Radiation Oncology | Admitting: Radiation Oncology

## 2017-01-21 DIAGNOSIS — Z510 Encounter for antineoplastic radiation therapy: Secondary | ICD-10-CM | POA: Diagnosis not present

## 2017-01-21 DIAGNOSIS — C44722 Squamous cell carcinoma of skin of right lower limb, including hip: Secondary | ICD-10-CM | POA: Diagnosis not present

## 2017-01-22 ENCOUNTER — Other Ambulatory Visit: Payer: Self-pay | Admitting: *Deleted

## 2017-01-22 DIAGNOSIS — C44722 Squamous cell carcinoma of skin of right lower limb, including hip: Secondary | ICD-10-CM | POA: Diagnosis not present

## 2017-01-22 DIAGNOSIS — Z510 Encounter for antineoplastic radiation therapy: Secondary | ICD-10-CM | POA: Diagnosis not present

## 2017-01-28 ENCOUNTER — Ambulatory Visit: Payer: PPO

## 2017-01-29 ENCOUNTER — Ambulatory Visit
Admission: RE | Admit: 2017-01-29 | Discharge: 2017-01-29 | Disposition: A | Discharge status: Home / Self Care | Payer: PPO | Source: Ambulatory Visit | Attending: Radiation Oncology | Admitting: Radiation Oncology

## 2017-01-29 DIAGNOSIS — Z0001 Encounter for general adult medical examination with abnormal findings: Secondary | ICD-10-CM | POA: Diagnosis not present

## 2017-01-29 DIAGNOSIS — E782 Mixed hyperlipidemia: Secondary | ICD-10-CM | POA: Diagnosis not present

## 2017-01-29 DIAGNOSIS — M810 Age-related osteoporosis without current pathological fracture: Secondary | ICD-10-CM | POA: Diagnosis not present

## 2017-01-29 DIAGNOSIS — I10 Essential (primary) hypertension: Secondary | ICD-10-CM | POA: Diagnosis not present

## 2017-01-29 DIAGNOSIS — D519 Vitamin B12 deficiency anemia, unspecified: Secondary | ICD-10-CM | POA: Diagnosis not present

## 2017-01-29 DIAGNOSIS — Z510 Encounter for antineoplastic radiation therapy: Secondary | ICD-10-CM | POA: Diagnosis not present

## 2017-01-29 DIAGNOSIS — C44229 Squamous cell carcinoma of skin of left ear and external auricular canal: Secondary | ICD-10-CM | POA: Diagnosis not present

## 2017-01-30 ENCOUNTER — Ambulatory Visit: Payer: PPO

## 2017-02-02 ENCOUNTER — Ambulatory Visit
Admission: RE | Admit: 2017-02-02 | Discharge: 2017-02-02 | Disposition: A | Discharge status: Home / Self Care | Payer: PPO | Source: Ambulatory Visit | Attending: Radiation Oncology | Admitting: Radiation Oncology

## 2017-02-02 DIAGNOSIS — Z510 Encounter for antineoplastic radiation therapy: Secondary | ICD-10-CM | POA: Diagnosis not present

## 2017-02-03 ENCOUNTER — Ambulatory Visit
Admission: RE | Admit: 2017-02-03 | Discharge: 2017-02-03 | Disposition: A | Discharge status: Home / Self Care | Payer: PPO | Source: Ambulatory Visit | Attending: Radiation Oncology | Admitting: Radiation Oncology

## 2017-02-03 DIAGNOSIS — Z510 Encounter for antineoplastic radiation therapy: Secondary | ICD-10-CM | POA: Diagnosis not present

## 2017-02-04 ENCOUNTER — Ambulatory Visit
Admission: RE | Admit: 2017-02-04 | Discharge: 2017-02-04 | Disposition: A | Discharge status: Home / Self Care | Payer: PPO | Source: Ambulatory Visit | Attending: Radiation Oncology | Admitting: Radiation Oncology

## 2017-02-04 DIAGNOSIS — Z510 Encounter for antineoplastic radiation therapy: Secondary | ICD-10-CM | POA: Diagnosis not present

## 2017-02-05 ENCOUNTER — Ambulatory Visit
Admission: RE | Admit: 2017-02-05 | Discharge: 2017-02-05 | Disposition: A | Discharge status: Home / Self Care | Payer: PPO | Source: Ambulatory Visit | Attending: Radiation Oncology | Admitting: Radiation Oncology

## 2017-02-05 DIAGNOSIS — C44722 Squamous cell carcinoma of skin of right lower limb, including hip: Secondary | ICD-10-CM | POA: Diagnosis not present

## 2017-02-05 DIAGNOSIS — Z510 Encounter for antineoplastic radiation therapy: Secondary | ICD-10-CM | POA: Diagnosis not present

## 2017-02-06 ENCOUNTER — Ambulatory Visit
Admission: RE | Admit: 2017-02-06 | Discharge: 2017-02-06 | Disposition: A | Discharge status: Home / Self Care | Payer: PPO | Source: Ambulatory Visit | Attending: Radiation Oncology | Admitting: Radiation Oncology

## 2017-02-06 DIAGNOSIS — Z510 Encounter for antineoplastic radiation therapy: Secondary | ICD-10-CM | POA: Diagnosis not present

## 2017-02-09 ENCOUNTER — Ambulatory Visit
Admission: RE | Admit: 2017-02-09 | Discharge: 2017-02-09 | Disposition: A | Discharge status: Home / Self Care | Payer: PPO | Source: Ambulatory Visit | Attending: Radiation Oncology | Admitting: Radiation Oncology

## 2017-02-09 DIAGNOSIS — Z510 Encounter for antineoplastic radiation therapy: Secondary | ICD-10-CM | POA: Diagnosis not present

## 2017-02-10 ENCOUNTER — Ambulatory Visit
Admission: RE | Admit: 2017-02-10 | Discharge: 2017-02-10 | Disposition: A | Discharge status: Home / Self Care | Payer: PPO | Source: Ambulatory Visit | Attending: Radiation Oncology | Admitting: Radiation Oncology

## 2017-02-10 DIAGNOSIS — E782 Mixed hyperlipidemia: Secondary | ICD-10-CM | POA: Diagnosis not present

## 2017-02-10 DIAGNOSIS — I10 Essential (primary) hypertension: Secondary | ICD-10-CM | POA: Diagnosis not present

## 2017-02-10 DIAGNOSIS — Z0001 Encounter for general adult medical examination with abnormal findings: Secondary | ICD-10-CM | POA: Diagnosis not present

## 2017-02-10 DIAGNOSIS — E559 Vitamin D deficiency, unspecified: Secondary | ICD-10-CM | POA: Diagnosis not present

## 2017-02-10 DIAGNOSIS — Z510 Encounter for antineoplastic radiation therapy: Secondary | ICD-10-CM | POA: Diagnosis not present

## 2017-02-11 ENCOUNTER — Inpatient Hospital Stay: Payer: PPO | Attending: Radiation Oncology

## 2017-02-11 ENCOUNTER — Ambulatory Visit
Admission: RE | Admit: 2017-02-11 | Discharge: 2017-02-11 | Disposition: A | Discharge status: Home / Self Care | Payer: PPO | Source: Ambulatory Visit | Attending: Radiation Oncology | Admitting: Radiation Oncology

## 2017-02-11 DIAGNOSIS — Z510 Encounter for antineoplastic radiation therapy: Secondary | ICD-10-CM | POA: Diagnosis not present

## 2017-02-11 DIAGNOSIS — C44729 Squamous cell carcinoma of skin of left lower limb, including hip: Secondary | ICD-10-CM | POA: Insufficient documentation

## 2017-02-11 DIAGNOSIS — C44722 Squamous cell carcinoma of skin of right lower limb, including hip: Secondary | ICD-10-CM | POA: Insufficient documentation

## 2017-02-11 LAB — CBC
HCT: 37 % (ref 35.0–47.0)
Hemoglobin: 12.4 g/dL (ref 12.0–16.0)
MCH: 29 pg (ref 26.0–34.0)
MCHC: 33.6 g/dL (ref 32.0–36.0)
MCV: 86.2 fL (ref 80.0–100.0)
Platelets: 244 10*3/uL (ref 150–440)
RBC: 4.29 MIL/uL (ref 3.80–5.20)
RDW: 14.3 % (ref 11.5–14.5)
WBC: 7.9 10*3/uL (ref 3.6–11.0)

## 2017-02-12 ENCOUNTER — Ambulatory Visit
Admission: RE | Admit: 2017-02-12 | Discharge: 2017-02-12 | Disposition: A | Discharge status: Home / Self Care | Payer: PPO | Source: Ambulatory Visit | Attending: Radiation Oncology | Admitting: Radiation Oncology

## 2017-02-12 DIAGNOSIS — Z510 Encounter for antineoplastic radiation therapy: Secondary | ICD-10-CM | POA: Diagnosis not present

## 2017-02-12 DIAGNOSIS — C44722 Squamous cell carcinoma of skin of right lower limb, including hip: Secondary | ICD-10-CM | POA: Diagnosis not present

## 2017-02-13 ENCOUNTER — Ambulatory Visit
Admission: RE | Admit: 2017-02-13 | Discharge: 2017-02-13 | Disposition: A | Discharge status: Home / Self Care | Payer: PPO | Source: Ambulatory Visit | Attending: Radiation Oncology | Admitting: Radiation Oncology

## 2017-02-13 DIAGNOSIS — Z510 Encounter for antineoplastic radiation therapy: Secondary | ICD-10-CM | POA: Diagnosis not present

## 2017-02-15 DIAGNOSIS — Z803 Family history of malignant neoplasm of breast: Secondary | ICD-10-CM | POA: Diagnosis not present

## 2017-02-15 DIAGNOSIS — C44722 Squamous cell carcinoma of skin of right lower limb, including hip: Secondary | ICD-10-CM | POA: Diagnosis not present

## 2017-02-15 DIAGNOSIS — E785 Hyperlipidemia, unspecified: Secondary | ICD-10-CM | POA: Diagnosis not present

## 2017-02-15 DIAGNOSIS — Z79899 Other long term (current) drug therapy: Secondary | ICD-10-CM | POA: Diagnosis not present

## 2017-02-15 DIAGNOSIS — D649 Anemia, unspecified: Secondary | ICD-10-CM | POA: Diagnosis not present

## 2017-02-15 DIAGNOSIS — I10 Essential (primary) hypertension: Secondary | ICD-10-CM | POA: Diagnosis not present

## 2017-02-15 DIAGNOSIS — Z510 Encounter for antineoplastic radiation therapy: Secondary | ICD-10-CM | POA: Diagnosis not present

## 2017-02-16 ENCOUNTER — Ambulatory Visit: Payer: PPO

## 2017-02-17 ENCOUNTER — Ambulatory Visit: Payer: PPO

## 2017-02-19 ENCOUNTER — Ambulatory Visit: Payer: PPO

## 2017-02-20 ENCOUNTER — Ambulatory Visit: Payer: PPO

## 2017-02-23 ENCOUNTER — Ambulatory Visit: Payer: PPO

## 2017-02-24 ENCOUNTER — Ambulatory Visit: Payer: PPO

## 2017-02-25 ENCOUNTER — Inpatient Hospital Stay: Payer: PPO

## 2017-02-25 ENCOUNTER — Ambulatory Visit: Payer: PPO

## 2017-02-26 ENCOUNTER — Ambulatory Visit: Payer: PPO

## 2017-02-27 ENCOUNTER — Ambulatory Visit: Payer: PPO

## 2017-02-28 ENCOUNTER — Ambulatory Visit: Payer: PPO

## 2017-03-02 ENCOUNTER — Ambulatory Visit: Payer: PPO

## 2017-03-03 ENCOUNTER — Ambulatory Visit: Payer: PPO

## 2017-03-03 ENCOUNTER — Ambulatory Visit
Admission: RE | Admit: 2017-03-03 | Discharge: 2017-03-03 | Disposition: A | Discharge status: Home / Self Care | Payer: PPO | Source: Ambulatory Visit | Attending: Radiation Oncology | Admitting: Radiation Oncology

## 2017-03-03 DIAGNOSIS — Z510 Encounter for antineoplastic radiation therapy: Secondary | ICD-10-CM | POA: Diagnosis not present

## 2017-03-03 DIAGNOSIS — I10 Essential (primary) hypertension: Secondary | ICD-10-CM | POA: Diagnosis not present

## 2017-03-03 DIAGNOSIS — D649 Anemia, unspecified: Secondary | ICD-10-CM | POA: Insufficient documentation

## 2017-03-03 DIAGNOSIS — C44722 Squamous cell carcinoma of skin of right lower limb, including hip: Secondary | ICD-10-CM | POA: Diagnosis not present

## 2017-03-03 DIAGNOSIS — Z79899 Other long term (current) drug therapy: Secondary | ICD-10-CM | POA: Diagnosis not present

## 2017-03-03 DIAGNOSIS — Z803 Family history of malignant neoplasm of breast: Secondary | ICD-10-CM | POA: Insufficient documentation

## 2017-03-03 DIAGNOSIS — E785 Hyperlipidemia, unspecified: Secondary | ICD-10-CM | POA: Insufficient documentation

## 2017-03-04 ENCOUNTER — Ambulatory Visit: Payer: PPO

## 2017-03-04 ENCOUNTER — Ambulatory Visit
Admission: RE | Admit: 2017-03-04 | Discharge: 2017-03-04 | Disposition: A | Discharge status: Home / Self Care | Payer: PPO | Source: Ambulatory Visit | Attending: Radiation Oncology | Admitting: Radiation Oncology

## 2017-03-04 DIAGNOSIS — Z510 Encounter for antineoplastic radiation therapy: Secondary | ICD-10-CM | POA: Diagnosis not present

## 2017-03-05 ENCOUNTER — Ambulatory Visit
Admission: RE | Admit: 2017-03-05 | Discharge: 2017-03-05 | Disposition: A | Discharge status: Home / Self Care | Payer: PPO | Source: Ambulatory Visit | Attending: Radiation Oncology | Admitting: Radiation Oncology

## 2017-03-05 DIAGNOSIS — Z510 Encounter for antineoplastic radiation therapy: Secondary | ICD-10-CM | POA: Diagnosis not present

## 2017-03-06 ENCOUNTER — Ambulatory Visit
Admission: RE | Admit: 2017-03-06 | Discharge: 2017-03-06 | Disposition: A | Discharge status: Home / Self Care | Payer: PPO | Source: Ambulatory Visit | Attending: Radiation Oncology | Admitting: Radiation Oncology

## 2017-03-06 DIAGNOSIS — Z510 Encounter for antineoplastic radiation therapy: Secondary | ICD-10-CM | POA: Diagnosis not present

## 2017-03-06 DIAGNOSIS — C44722 Squamous cell carcinoma of skin of right lower limb, including hip: Secondary | ICD-10-CM | POA: Diagnosis not present

## 2017-03-09 ENCOUNTER — Ambulatory Visit
Admission: RE | Admit: 2017-03-09 | Discharge: 2017-03-09 | Disposition: A | Discharge status: Home / Self Care | Payer: PPO | Source: Ambulatory Visit | Attending: Radiation Oncology | Admitting: Radiation Oncology

## 2017-03-09 DIAGNOSIS — Z510 Encounter for antineoplastic radiation therapy: Secondary | ICD-10-CM | POA: Diagnosis not present

## 2017-03-10 ENCOUNTER — Ambulatory Visit
Admission: RE | Admit: 2017-03-10 | Discharge: 2017-03-10 | Disposition: A | Discharge status: Home / Self Care | Payer: PPO | Source: Ambulatory Visit | Attending: Radiation Oncology | Admitting: Radiation Oncology

## 2017-03-10 DIAGNOSIS — Z510 Encounter for antineoplastic radiation therapy: Secondary | ICD-10-CM | POA: Diagnosis not present

## 2017-03-11 ENCOUNTER — Inpatient Hospital Stay: Payer: PPO | Attending: Radiation Oncology

## 2017-03-11 ENCOUNTER — Ambulatory Visit
Admission: RE | Admit: 2017-03-11 | Discharge: 2017-03-11 | Disposition: A | Discharge status: Home / Self Care | Payer: PPO | Source: Ambulatory Visit | Attending: Radiation Oncology | Admitting: Radiation Oncology

## 2017-03-11 DIAGNOSIS — C44729 Squamous cell carcinoma of skin of left lower limb, including hip: Secondary | ICD-10-CM | POA: Insufficient documentation

## 2017-03-11 DIAGNOSIS — Z510 Encounter for antineoplastic radiation therapy: Secondary | ICD-10-CM | POA: Diagnosis not present

## 2017-03-11 DIAGNOSIS — C44722 Squamous cell carcinoma of skin of right lower limb, including hip: Secondary | ICD-10-CM | POA: Diagnosis not present

## 2017-03-11 LAB — CBC
HCT: 33.4 % — ABNORMAL LOW (ref 35.0–47.0)
Hemoglobin: 11.3 g/dL — ABNORMAL LOW (ref 12.0–16.0)
MCH: 29.1 pg (ref 26.0–34.0)
MCHC: 33.7 g/dL (ref 32.0–36.0)
MCV: 86.2 fL (ref 80.0–100.0)
Platelets: 303 10*3/uL (ref 150–440)
RBC: 3.87 MIL/uL (ref 3.80–5.20)
RDW: 14.5 % (ref 11.5–14.5)
WBC: 7.6 10*3/uL (ref 3.6–11.0)

## 2017-03-12 ENCOUNTER — Ambulatory Visit
Admission: RE | Admit: 2017-03-12 | Discharge: 2017-03-12 | Disposition: A | Discharge status: Home / Self Care | Payer: PPO | Source: Ambulatory Visit | Attending: Radiation Oncology | Admitting: Radiation Oncology

## 2017-03-12 DIAGNOSIS — Z510 Encounter for antineoplastic radiation therapy: Secondary | ICD-10-CM | POA: Diagnosis not present

## 2017-03-13 ENCOUNTER — Ambulatory Visit
Admission: RE | Admit: 2017-03-13 | Discharge: 2017-03-13 | Disposition: A | Discharge status: Home / Self Care | Payer: PPO | Source: Ambulatory Visit | Attending: Radiation Oncology | Admitting: Radiation Oncology

## 2017-03-13 DIAGNOSIS — Z510 Encounter for antineoplastic radiation therapy: Secondary | ICD-10-CM | POA: Diagnosis not present

## 2017-03-13 DIAGNOSIS — C44722 Squamous cell carcinoma of skin of right lower limb, including hip: Secondary | ICD-10-CM | POA: Diagnosis not present

## 2017-03-16 ENCOUNTER — Ambulatory Visit
Admission: RE | Admit: 2017-03-16 | Discharge: 2017-03-16 | Disposition: A | Discharge status: Home / Self Care | Payer: PPO | Source: Ambulatory Visit | Attending: Radiation Oncology | Admitting: Radiation Oncology

## 2017-03-16 DIAGNOSIS — Z510 Encounter for antineoplastic radiation therapy: Secondary | ICD-10-CM | POA: Diagnosis not present

## 2017-03-17 ENCOUNTER — Ambulatory Visit
Admission: RE | Admit: 2017-03-17 | Discharge: 2017-03-17 | Disposition: A | Discharge status: Home / Self Care | Payer: PPO | Source: Ambulatory Visit | Attending: Radiation Oncology | Admitting: Radiation Oncology

## 2017-03-17 DIAGNOSIS — Z510 Encounter for antineoplastic radiation therapy: Secondary | ICD-10-CM | POA: Diagnosis not present

## 2017-03-18 ENCOUNTER — Ambulatory Visit: Payer: PPO

## 2017-03-18 ENCOUNTER — Ambulatory Visit
Admission: RE | Admit: 2017-03-18 | Discharge: 2017-03-18 | Disposition: A | Discharge status: Home / Self Care | Payer: PPO | Source: Ambulatory Visit | Attending: Radiation Oncology | Admitting: Radiation Oncology

## 2017-03-18 DIAGNOSIS — Z510 Encounter for antineoplastic radiation therapy: Secondary | ICD-10-CM | POA: Diagnosis not present

## 2017-03-19 ENCOUNTER — Ambulatory Visit: Payer: PPO

## 2017-03-19 ENCOUNTER — Ambulatory Visit
Admission: RE | Admit: 2017-03-19 | Discharge: 2017-03-19 | Disposition: A | Discharge status: Home / Self Care | Payer: PPO | Source: Ambulatory Visit | Attending: Radiation Oncology | Admitting: Radiation Oncology

## 2017-03-19 DIAGNOSIS — Z510 Encounter for antineoplastic radiation therapy: Secondary | ICD-10-CM | POA: Diagnosis not present

## 2017-03-20 ENCOUNTER — Ambulatory Visit
Admission: RE | Admit: 2017-03-20 | Discharge: 2017-03-20 | Disposition: A | Discharge status: Home / Self Care | Payer: PPO | Source: Ambulatory Visit | Attending: Radiation Oncology | Admitting: Radiation Oncology

## 2017-03-20 DIAGNOSIS — C44722 Squamous cell carcinoma of skin of right lower limb, including hip: Secondary | ICD-10-CM | POA: Diagnosis not present

## 2017-03-20 DIAGNOSIS — Z510 Encounter for antineoplastic radiation therapy: Secondary | ICD-10-CM | POA: Diagnosis not present

## 2017-03-23 DIAGNOSIS — L820 Inflamed seborrheic keratosis: Secondary | ICD-10-CM | POA: Diagnosis not present

## 2017-03-23 DIAGNOSIS — L570 Actinic keratosis: Secondary | ICD-10-CM | POA: Diagnosis not present

## 2017-03-23 DIAGNOSIS — D485 Neoplasm of uncertain behavior of skin: Secondary | ICD-10-CM | POA: Diagnosis not present

## 2017-03-23 DIAGNOSIS — L578 Other skin changes due to chronic exposure to nonionizing radiation: Secondary | ICD-10-CM | POA: Diagnosis not present

## 2017-03-23 DIAGNOSIS — Z85828 Personal history of other malignant neoplasm of skin: Secondary | ICD-10-CM | POA: Diagnosis not present

## 2017-03-23 DIAGNOSIS — L821 Other seborrheic keratosis: Secondary | ICD-10-CM | POA: Diagnosis not present

## 2017-03-23 DIAGNOSIS — C44729 Squamous cell carcinoma of skin of left lower limb, including hip: Secondary | ICD-10-CM | POA: Diagnosis not present

## 2017-04-03 ENCOUNTER — Encounter: Payer: Self-pay | Admitting: Radiation Oncology

## 2017-04-03 ENCOUNTER — Ambulatory Visit
Admission: RE | Admit: 2017-04-03 | Discharge: 2017-04-03 | Disposition: A | Discharge status: Home / Self Care | Payer: PPO | Source: Ambulatory Visit | Attending: Radiation Oncology | Admitting: Radiation Oncology

## 2017-04-03 VITALS — BP 144/74 | HR 67 | Temp 97.6°F | Wt 105.4 lb

## 2017-04-03 DIAGNOSIS — D519 Vitamin B12 deficiency anemia, unspecified: Secondary | ICD-10-CM | POA: Diagnosis not present

## 2017-04-03 DIAGNOSIS — C44721 Squamous cell carcinoma of skin of unspecified lower limb, including hip: Secondary | ICD-10-CM

## 2017-04-03 DIAGNOSIS — C44722 Squamous cell carcinoma of skin of right lower limb, including hip: Secondary | ICD-10-CM | POA: Insufficient documentation

## 2017-04-03 DIAGNOSIS — Z923 Personal history of irradiation: Secondary | ICD-10-CM | POA: Diagnosis not present

## 2017-04-03 NOTE — Progress Notes (Signed)
Radiation Oncology Follow up Note  Name: Joann Warren   Date:   04/03/2017 MRN:  [id number] DOB: 1946-03-23    This 71 y.o. female presents to the clinic today for follow-up status post electron beam therapy to her right lower extremity for widespread skin cancers.Loistine Chance PROVIDER: Lavera Guise, MD  HPI: Patient is a 71 year old female with bilateral lower extremities widespread skin cancer. We have previously treated her right lower extremity with extended field electron beam. She seen today in follow-up and doing well. Lesions look mostly necrotic at this point with peeling of the skin. She is without complaint. We here to discuss today going ahead with electron beam therapy to her left lower extremity..  COMPLICATIONS OF TREATMENT: none  FOLLOW UP COMPLIANCE: keeps appointments   PHYSICAL EXAM:  BP (!) 144/74   Pulse 67   Temp 97.6 F (36.4 C)   Wt 105 lb 6.1 oz (47.8 kg)   BMI 19.27 kg/m  Right lower extremity shows evidence of necrotic skin lesions with dry desquamation of the skin noted. She is active disease on the anterior portion of her entire left lower extremity. No inguinal adenopathy is appreciated. Well-developed well-nourished patient in NAD. HEENT reveals PERLA, EOMI, discs not visualized.  Oral cavity is clear. No oral mucosal lesions are identified. Neck is clear without evidence of cervical or supraclavicular adenopathy. Lungs are clear to A&P. Cardiac examination is essentially unremarkable with regular rate and rhythm without murmur rub or thrill. Abdomen is benign with no organomegaly or masses noted. Motor sensory and DTR levels are equal and symmetric in the upper and lower extremities. Cranial nerves II through XII are grossly intact. Proprioception is intact. No peripheral adenopathy or edema is identified. No motor or sensory levels are noted. Crude visual fields are within normal range.  RADIOLOGY RESULTS: No current films to review  PLAN: Present  time like to go ahead with extended beam electron beam therapy to as much as the anterior surface of her left lower extremity as possible. Will repeat a similar set up we had on her right lower extremity which seems to be effective. Again risks and benefits of treatment again reviewed with the patient and her husband. They're going on a vacation I'm set setting her for treatment planning in the later part of October. Patient is to call sooner with any concerns.  I would like to take this opportunity to thank you for allowing me to participate in the care of your patient.Armstead Peaks., MD

## 2017-04-13 DIAGNOSIS — D485 Neoplasm of uncertain behavior of skin: Secondary | ICD-10-CM | POA: Diagnosis not present

## 2017-04-13 DIAGNOSIS — C44722 Squamous cell carcinoma of skin of right lower limb, including hip: Secondary | ICD-10-CM | POA: Diagnosis not present

## 2017-04-13 DIAGNOSIS — Z85828 Personal history of other malignant neoplasm of skin: Secondary | ICD-10-CM | POA: Diagnosis not present

## 2017-04-13 DIAGNOSIS — L578 Other skin changes due to chronic exposure to nonionizing radiation: Secondary | ICD-10-CM | POA: Diagnosis not present

## 2017-04-13 DIAGNOSIS — L281 Prurigo nodularis: Secondary | ICD-10-CM | POA: Diagnosis not present

## 2017-04-13 DIAGNOSIS — L02415 Cutaneous abscess of right lower limb: Secondary | ICD-10-CM | POA: Diagnosis not present

## 2017-04-27 DIAGNOSIS — C44729 Squamous cell carcinoma of skin of left lower limb, including hip: Secondary | ICD-10-CM | POA: Diagnosis not present

## 2017-04-27 DIAGNOSIS — L578 Other skin changes due to chronic exposure to nonionizing radiation: Secondary | ICD-10-CM | POA: Diagnosis not present

## 2017-04-27 DIAGNOSIS — D485 Neoplasm of uncertain behavior of skin: Secondary | ICD-10-CM | POA: Diagnosis not present

## 2017-04-27 DIAGNOSIS — Z85828 Personal history of other malignant neoplasm of skin: Secondary | ICD-10-CM | POA: Diagnosis not present

## 2017-05-05 DIAGNOSIS — D519 Vitamin B12 deficiency anemia, unspecified: Secondary | ICD-10-CM | POA: Diagnosis not present

## 2017-05-14 DIAGNOSIS — L281 Prurigo nodularis: Secondary | ICD-10-CM | POA: Diagnosis not present

## 2017-05-14 DIAGNOSIS — C44629 Squamous cell carcinoma of skin of left upper limb, including shoulder: Secondary | ICD-10-CM | POA: Diagnosis not present

## 2017-05-14 DIAGNOSIS — D485 Neoplasm of uncertain behavior of skin: Secondary | ICD-10-CM | POA: Diagnosis not present

## 2017-05-14 DIAGNOSIS — L578 Other skin changes due to chronic exposure to nonionizing radiation: Secondary | ICD-10-CM | POA: Diagnosis not present

## 2017-05-14 DIAGNOSIS — L570 Actinic keratosis: Secondary | ICD-10-CM | POA: Diagnosis not present

## 2017-05-14 DIAGNOSIS — Z85828 Personal history of other malignant neoplasm of skin: Secondary | ICD-10-CM | POA: Diagnosis not present

## 2017-06-09 ENCOUNTER — Ambulatory Visit
Admission: RE | Admit: 2017-06-09 | Discharge: 2017-06-09 | Disposition: A | Discharge status: Home / Self Care | Payer: PPO | Source: Ambulatory Visit | Attending: Radiation Oncology | Admitting: Radiation Oncology

## 2017-06-09 DIAGNOSIS — Z23 Encounter for immunization: Secondary | ICD-10-CM | POA: Diagnosis not present

## 2017-06-09 DIAGNOSIS — C44722 Squamous cell carcinoma of skin of right lower limb, including hip: Secondary | ICD-10-CM | POA: Diagnosis not present

## 2017-06-09 DIAGNOSIS — D519 Vitamin B12 deficiency anemia, unspecified: Secondary | ICD-10-CM | POA: Diagnosis not present

## 2017-06-15 ENCOUNTER — Ambulatory Visit
Admission: RE | Admit: 2017-06-15 | Discharge: 2017-06-15 | Disposition: A | Discharge status: Home / Self Care | Payer: PPO | Source: Ambulatory Visit | Attending: Radiation Oncology | Admitting: Radiation Oncology

## 2017-06-15 DIAGNOSIS — C44722 Squamous cell carcinoma of skin of right lower limb, including hip: Secondary | ICD-10-CM | POA: Diagnosis not present

## 2017-06-16 ENCOUNTER — Ambulatory Visit
Admission: RE | Admit: 2017-06-16 | Discharge: 2017-06-16 | Disposition: A | Discharge status: Home / Self Care | Payer: PPO | Source: Ambulatory Visit | Attending: Radiation Oncology | Admitting: Radiation Oncology

## 2017-06-16 DIAGNOSIS — C44722 Squamous cell carcinoma of skin of right lower limb, including hip: Secondary | ICD-10-CM | POA: Diagnosis not present

## 2017-06-17 ENCOUNTER — Ambulatory Visit
Admission: RE | Admit: 2017-06-17 | Discharge: 2017-06-17 | Disposition: A | Discharge status: Home / Self Care | Payer: PPO | Source: Ambulatory Visit | Attending: Radiation Oncology | Admitting: Radiation Oncology

## 2017-06-17 DIAGNOSIS — C44722 Squamous cell carcinoma of skin of right lower limb, including hip: Secondary | ICD-10-CM | POA: Diagnosis not present

## 2017-06-18 ENCOUNTER — Ambulatory Visit
Admission: RE | Admit: 2017-06-18 | Discharge: 2017-06-18 | Disposition: A | Discharge status: Home / Self Care | Payer: PPO | Source: Ambulatory Visit | Attending: Radiation Oncology | Admitting: Radiation Oncology

## 2017-06-18 DIAGNOSIS — C44722 Squamous cell carcinoma of skin of right lower limb, including hip: Secondary | ICD-10-CM | POA: Diagnosis not present

## 2017-06-19 ENCOUNTER — Ambulatory Visit
Admission: RE | Admit: 2017-06-19 | Discharge: 2017-06-19 | Disposition: A | Discharge status: Home / Self Care | Payer: PPO | Source: Ambulatory Visit | Attending: Radiation Oncology | Admitting: Radiation Oncology

## 2017-06-19 DIAGNOSIS — C44722 Squamous cell carcinoma of skin of right lower limb, including hip: Secondary | ICD-10-CM | POA: Diagnosis not present

## 2017-06-22 ENCOUNTER — Ambulatory Visit
Admission: RE | Admit: 2017-06-22 | Discharge: 2017-06-22 | Disposition: A | Discharge status: Home / Self Care | Payer: PPO | Source: Ambulatory Visit | Attending: Radiation Oncology | Admitting: Radiation Oncology

## 2017-06-22 DIAGNOSIS — C44722 Squamous cell carcinoma of skin of right lower limb, including hip: Secondary | ICD-10-CM | POA: Diagnosis not present

## 2017-06-23 ENCOUNTER — Ambulatory Visit
Admission: RE | Admit: 2017-06-23 | Discharge: 2017-06-23 | Disposition: A | Discharge status: Home / Self Care | Payer: PPO | Source: Ambulatory Visit | Attending: Radiation Oncology | Admitting: Radiation Oncology

## 2017-06-23 DIAGNOSIS — H401112 Primary open-angle glaucoma, right eye, moderate stage: Secondary | ICD-10-CM | POA: Diagnosis not present

## 2017-06-23 DIAGNOSIS — C44722 Squamous cell carcinoma of skin of right lower limb, including hip: Secondary | ICD-10-CM | POA: Diagnosis not present

## 2017-06-24 ENCOUNTER — Ambulatory Visit: Payer: PPO

## 2017-06-25 ENCOUNTER — Ambulatory Visit
Admission: RE | Admit: 2017-06-25 | Discharge: 2017-06-25 | Disposition: A | Discharge status: Home / Self Care | Payer: PPO | Source: Ambulatory Visit | Attending: Radiation Oncology | Admitting: Radiation Oncology

## 2017-06-25 DIAGNOSIS — L578 Other skin changes due to chronic exposure to nonionizing radiation: Secondary | ICD-10-CM | POA: Diagnosis not present

## 2017-06-25 DIAGNOSIS — C44722 Squamous cell carcinoma of skin of right lower limb, including hip: Secondary | ICD-10-CM | POA: Diagnosis not present

## 2017-06-25 DIAGNOSIS — Z85828 Personal history of other malignant neoplasm of skin: Secondary | ICD-10-CM | POA: Diagnosis not present

## 2017-06-25 DIAGNOSIS — D485 Neoplasm of uncertain behavior of skin: Secondary | ICD-10-CM | POA: Diagnosis not present

## 2017-06-25 DIAGNOSIS — L570 Actinic keratosis: Secondary | ICD-10-CM | POA: Diagnosis not present

## 2017-06-25 DIAGNOSIS — D0471 Carcinoma in situ of skin of right lower limb, including hip: Secondary | ICD-10-CM | POA: Diagnosis not present

## 2017-06-26 ENCOUNTER — Ambulatory Visit
Admission: RE | Admit: 2017-06-26 | Discharge: 2017-06-26 | Disposition: A | Discharge status: Home / Self Care | Payer: PPO | Source: Ambulatory Visit | Attending: Radiation Oncology | Admitting: Radiation Oncology

## 2017-06-26 DIAGNOSIS — C44722 Squamous cell carcinoma of skin of right lower limb, including hip: Secondary | ICD-10-CM | POA: Diagnosis not present

## 2017-06-29 ENCOUNTER — Ambulatory Visit
Admission: RE | Admit: 2017-06-29 | Discharge: 2017-06-29 | Disposition: A | Discharge status: Home / Self Care | Payer: PPO | Source: Ambulatory Visit | Attending: Radiation Oncology | Admitting: Radiation Oncology

## 2017-06-29 DIAGNOSIS — C44722 Squamous cell carcinoma of skin of right lower limb, including hip: Secondary | ICD-10-CM | POA: Diagnosis not present

## 2017-06-29 DIAGNOSIS — D519 Vitamin B12 deficiency anemia, unspecified: Secondary | ICD-10-CM | POA: Diagnosis not present

## 2017-06-30 ENCOUNTER — Ambulatory Visit
Admission: RE | Admit: 2017-06-30 | Discharge: 2017-06-30 | Disposition: A | Discharge status: Home / Self Care | Payer: PPO | Source: Ambulatory Visit | Attending: Radiation Oncology | Admitting: Radiation Oncology

## 2017-06-30 DIAGNOSIS — C44722 Squamous cell carcinoma of skin of right lower limb, including hip: Secondary | ICD-10-CM | POA: Diagnosis not present

## 2017-06-30 DIAGNOSIS — H401112 Primary open-angle glaucoma, right eye, moderate stage: Secondary | ICD-10-CM | POA: Diagnosis not present

## 2017-07-01 ENCOUNTER — Ambulatory Visit
Admission: RE | Admit: 2017-07-01 | Discharge: 2017-07-01 | Disposition: A | Discharge status: Home / Self Care | Payer: PPO | Source: Ambulatory Visit | Attending: Radiation Oncology | Admitting: Radiation Oncology

## 2017-07-01 DIAGNOSIS — C44722 Squamous cell carcinoma of skin of right lower limb, including hip: Secondary | ICD-10-CM | POA: Diagnosis not present

## 2017-07-02 ENCOUNTER — Ambulatory Visit
Admission: RE | Admit: 2017-07-02 | Discharge: 2017-07-02 | Disposition: A | Discharge status: Home / Self Care | Payer: PPO | Source: Ambulatory Visit | Attending: Radiation Oncology | Admitting: Radiation Oncology

## 2017-07-02 DIAGNOSIS — C44722 Squamous cell carcinoma of skin of right lower limb, including hip: Secondary | ICD-10-CM | POA: Diagnosis not present

## 2017-07-03 ENCOUNTER — Ambulatory Visit
Admission: RE | Admit: 2017-07-03 | Discharge: 2017-07-03 | Disposition: A | Discharge status: Home / Self Care | Payer: PPO | Source: Ambulatory Visit | Attending: Radiation Oncology | Admitting: Radiation Oncology

## 2017-07-03 DIAGNOSIS — C44722 Squamous cell carcinoma of skin of right lower limb, including hip: Secondary | ICD-10-CM | POA: Diagnosis not present

## 2017-07-06 ENCOUNTER — Ambulatory Visit
Admission: RE | Admit: 2017-07-06 | Discharge: 2017-07-06 | Disposition: A | Discharge status: Home / Self Care | Payer: PPO | Source: Ambulatory Visit | Attending: Radiation Oncology | Admitting: Radiation Oncology

## 2017-07-06 DIAGNOSIS — C44722 Squamous cell carcinoma of skin of right lower limb, including hip: Secondary | ICD-10-CM | POA: Diagnosis not present

## 2017-07-07 ENCOUNTER — Ambulatory Visit
Admission: RE | Admit: 2017-07-07 | Discharge: 2017-07-07 | Disposition: A | Discharge status: Home / Self Care | Payer: PPO | Source: Ambulatory Visit | Attending: Radiation Oncology | Admitting: Radiation Oncology

## 2017-07-07 DIAGNOSIS — C44722 Squamous cell carcinoma of skin of right lower limb, including hip: Secondary | ICD-10-CM | POA: Diagnosis not present

## 2017-07-08 ENCOUNTER — Ambulatory Visit
Admission: RE | Admit: 2017-07-08 | Discharge: 2017-07-08 | Disposition: A | Discharge status: Home / Self Care | Payer: PPO | Source: Ambulatory Visit | Attending: Radiation Oncology | Admitting: Radiation Oncology

## 2017-07-08 DIAGNOSIS — C44722 Squamous cell carcinoma of skin of right lower limb, including hip: Secondary | ICD-10-CM | POA: Diagnosis not present

## 2017-07-13 ENCOUNTER — Ambulatory Visit
Admission: RE | Admit: 2017-07-13 | Discharge: 2017-07-13 | Disposition: A | Discharge status: Home / Self Care | Payer: PPO | Source: Ambulatory Visit | Attending: Radiation Oncology | Admitting: Radiation Oncology

## 2017-07-13 DIAGNOSIS — C44722 Squamous cell carcinoma of skin of right lower limb, including hip: Secondary | ICD-10-CM | POA: Diagnosis not present

## 2017-07-14 ENCOUNTER — Ambulatory Visit
Admission: RE | Admit: 2017-07-14 | Discharge: 2017-07-14 | Disposition: A | Discharge status: Home / Self Care | Payer: PPO | Source: Ambulatory Visit | Attending: Radiation Oncology | Admitting: Radiation Oncology

## 2017-07-14 DIAGNOSIS — C44722 Squamous cell carcinoma of skin of right lower limb, including hip: Secondary | ICD-10-CM | POA: Diagnosis not present

## 2017-07-15 ENCOUNTER — Ambulatory Visit
Admission: RE | Admit: 2017-07-15 | Discharge: 2017-07-15 | Disposition: A | Discharge status: Home / Self Care | Payer: PPO | Source: Ambulatory Visit | Attending: Radiation Oncology | Admitting: Radiation Oncology

## 2017-07-15 ENCOUNTER — Other Ambulatory Visit: Payer: Self-pay | Admitting: *Deleted

## 2017-07-15 DIAGNOSIS — C44722 Squamous cell carcinoma of skin of right lower limb, including hip: Secondary | ICD-10-CM | POA: Diagnosis not present

## 2017-07-15 MED ORDER — HYDROCODONE-ACETAMINOPHEN 5-325 MG PO TABS: 1.0000 | ORAL_TABLET | Freq: Four times a day (QID) | ORAL | 0 refills | Status: DC | PRN

## 2017-07-16 ENCOUNTER — Ambulatory Visit
Admission: RE | Admit: 2017-07-16 | Discharge: 2017-07-16 | Disposition: A | Discharge status: Home / Self Care | Payer: PPO | Source: Ambulatory Visit | Attending: Radiation Oncology | Admitting: Radiation Oncology

## 2017-07-16 DIAGNOSIS — C44722 Squamous cell carcinoma of skin of right lower limb, including hip: Secondary | ICD-10-CM | POA: Diagnosis not present

## 2017-07-17 ENCOUNTER — Ambulatory Visit
Admission: RE | Admit: 2017-07-17 | Discharge: 2017-07-17 | Disposition: A | Discharge status: Home / Self Care | Payer: PPO | Source: Ambulatory Visit | Attending: Radiation Oncology | Admitting: Radiation Oncology

## 2017-07-17 DIAGNOSIS — C44722 Squamous cell carcinoma of skin of right lower limb, including hip: Secondary | ICD-10-CM | POA: Diagnosis not present

## 2017-07-20 ENCOUNTER — Ambulatory Visit
Admission: RE | Admit: 2017-07-20 | Discharge: 2017-07-20 | Disposition: A | Discharge status: Home / Self Care | Payer: PPO | Source: Ambulatory Visit | Attending: Radiation Oncology | Admitting: Radiation Oncology

## 2017-07-20 DIAGNOSIS — C44722 Squamous cell carcinoma of skin of right lower limb, including hip: Secondary | ICD-10-CM | POA: Diagnosis not present

## 2017-07-21 ENCOUNTER — Ambulatory Visit
Admission: RE | Admit: 2017-07-21 | Discharge: 2017-07-21 | Disposition: A | Discharge status: Home / Self Care | Payer: PPO | Source: Ambulatory Visit | Attending: Radiation Oncology | Admitting: Radiation Oncology

## 2017-07-21 ENCOUNTER — Other Ambulatory Visit: Payer: Self-pay | Admitting: Radiation Oncology

## 2017-07-21 ENCOUNTER — Other Ambulatory Visit: Payer: Self-pay | Admitting: *Deleted

## 2017-07-21 DIAGNOSIS — C44722 Squamous cell carcinoma of skin of right lower limb, including hip: Secondary | ICD-10-CM | POA: Diagnosis not present

## 2017-07-21 MED ORDER — SILVER SULFADIAZINE 1 % EX CREA: 1.0000 "application " | TOPICAL_CREAM | Freq: Two times a day (BID) | CUTANEOUS | 4 refills | Status: DC

## 2017-07-22 ENCOUNTER — Ambulatory Visit
Admission: RE | Admit: 2017-07-22 | Discharge: 2017-07-22 | Disposition: A | Discharge status: Home / Self Care | Payer: PPO | Source: Ambulatory Visit | Attending: Radiation Oncology | Admitting: Radiation Oncology

## 2017-07-22 DIAGNOSIS — C44722 Squamous cell carcinoma of skin of right lower limb, including hip: Secondary | ICD-10-CM | POA: Diagnosis not present

## 2017-07-23 DIAGNOSIS — L0291 Cutaneous abscess, unspecified: Secondary | ICD-10-CM | POA: Diagnosis not present

## 2017-07-23 DIAGNOSIS — L859 Epidermal thickening, unspecified: Secondary | ICD-10-CM | POA: Diagnosis not present

## 2017-07-23 DIAGNOSIS — L02415 Cutaneous abscess of right lower limb: Secondary | ICD-10-CM | POA: Diagnosis not present

## 2017-08-04 ENCOUNTER — Ambulatory Visit: Payer: PPO

## 2017-08-04 DIAGNOSIS — D519 Vitamin B12 deficiency anemia, unspecified: Secondary | ICD-10-CM | POA: Diagnosis not present

## 2017-08-04 MED ORDER — CYANOCOBALAMIN 1000 MCG/ML IJ SOLN
1000.0000 ug | Freq: Once | INTRAMUSCULAR | Status: AC
  Administered 2017-08-04: 1000 ug via INTRAMUSCULAR

## 2017-08-05 ENCOUNTER — Encounter: Payer: Self-pay | Admitting: *Deleted

## 2017-08-06 ENCOUNTER — Ambulatory Visit: Payer: Self-pay | Admitting: Internal Medicine

## 2017-08-07 ENCOUNTER — Ambulatory Visit: Payer: Self-pay

## 2017-08-27 ENCOUNTER — Encounter: Payer: Self-pay | Admitting: *Deleted

## 2017-08-28 ENCOUNTER — Ambulatory Visit: Payer: PPO | Admitting: Radiation Oncology

## 2017-09-02 DIAGNOSIS — L821 Other seborrheic keratosis: Secondary | ICD-10-CM | POA: Diagnosis not present

## 2017-09-02 DIAGNOSIS — D485 Neoplasm of uncertain behavior of skin: Secondary | ICD-10-CM | POA: Diagnosis not present

## 2017-09-02 DIAGNOSIS — L820 Inflamed seborrheic keratosis: Secondary | ICD-10-CM | POA: Diagnosis not present

## 2017-09-02 DIAGNOSIS — L578 Other skin changes due to chronic exposure to nonionizing radiation: Secondary | ICD-10-CM | POA: Diagnosis not present

## 2017-09-02 DIAGNOSIS — C44529 Squamous cell carcinoma of skin of other part of trunk: Secondary | ICD-10-CM | POA: Diagnosis not present

## 2017-09-02 DIAGNOSIS — L570 Actinic keratosis: Secondary | ICD-10-CM | POA: Diagnosis not present

## 2017-09-02 DIAGNOSIS — Z85828 Personal history of other malignant neoplasm of skin: Secondary | ICD-10-CM | POA: Diagnosis not present

## 2017-09-04 ENCOUNTER — Encounter: Payer: Self-pay | Admitting: Nurse Practitioner

## 2017-09-04 ENCOUNTER — Ambulatory Visit (INDEPENDENT_AMBULATORY_CARE_PROVIDER_SITE_OTHER): Payer: PPO | Admitting: Nurse Practitioner

## 2017-09-04 VITALS — BP 141/78 | HR 68 | Resp 16 | Ht 62.0 in | Wt 104.8 lb

## 2017-09-04 DIAGNOSIS — E538 Deficiency of other specified B group vitamins: Secondary | ICD-10-CM

## 2017-09-04 DIAGNOSIS — Z1231 Encounter for screening mammogram for malignant neoplasm of breast: Secondary | ICD-10-CM

## 2017-09-04 DIAGNOSIS — C44701 Unspecified malignant neoplasm of skin of unspecified lower limb, including hip: Secondary | ICD-10-CM

## 2017-09-04 DIAGNOSIS — Z1239 Encounter for other screening for malignant neoplasm of breast: Secondary | ICD-10-CM

## 2017-09-04 MED ORDER — CYANOCOBALAMIN 1000 MCG/ML IJ SOLN
1000.0000 ug | Freq: Once | INTRAMUSCULAR | Status: AC
  Administered 2017-09-04: 1000 ug via INTRAMUSCULAR

## 2017-09-04 NOTE — Progress Notes (Signed)
[facility] [place], [place] [address]  Internal MEDICINE  Office Visit Note  Patient Name: Joann Warren  [id number]  [id number]  Date of Service: 09/04/2017  Chief Complaint  Patient presents with  . Medication Management    question on whether it causes more problems in the long run    The patient is here for routine follow up visit. She has no concerns or complaints to report today. She is due for b12 injection.     Pt is here for routine follow up.    Current Medication: Outpatient Encounter Medications as of 09/04/2017  Medication Sig  . alendronate (FOSAMAX) 70 MG tablet TAKE ONE TAB ON EMPTY STOMACH WITH FULL GLASS OF WATER  . amLODipine (NORVASC) 5 MG tablet Take 5 mg by mouth daily.  . bimatoprost (LUMIGAN) 0.03 % ophthalmic solution 1 drop at bedtime.  . bisoprolol-hydrochlorothiazide (ZIAC) 2.5-6.25 MG per tablet Take 1 tablet by mouth daily.  . simvastatin (ZOCOR) 20 MG tablet Take 20 mg by mouth daily.  . bimatoprost (LATISSE) 0.03 % ophthalmic solution Place into both eyes at bedtime. Place one drop on applicator and apply evenly along the skin of the upper eyelid at base of eyelashes once daily at bedtime; repeat procedure for second eye (use a clean applicator).  . fluorouracil (EFUDEX) 5 % cream   . HYDROcodone-acetaminophen (NORCO) 5-325 MG tablet Take 1 tablet by mouth every 6 (six) hours as needed for moderate pain. (Patient not taking: Reported on 09/04/2017)  . ibandronate (BONIVA) 150 MG tablet Take 150 mg by mouth every 30 (thirty) days. Take in the morning with a full glass of water, on an empty stomach, and do not take anything else by mouth or lie down for the next 30 min.  . silver sulfADIAZINE (SILVADENE) 1 % cream Apply 1 application topically 2 (two) times daily. (Patient not taking: Reported on 09/04/2017)   No facility-administered encounter medications on file as of 09/04/2017.     Surgical History: Past Surgical  History:  Procedure Laterality Date  . BREAST EXCISIONAL BIOPSY Left 1972   neg  . BREAST EXCISIONAL BIOPSY Right 1980   neg  . COLONOSCOPY    . ESOPHAGOGASTRODUODENOSCOPY (EGD) WITH PROPOFOL N/A 04/09/2015   Procedure: ESOPHAGOGASTRODUODENOSCOPY (EGD) WITH PROPOFOL;  Surgeon: Manya Silvas, MD;  Location: [facility] ENDOSCOPY;  Service: Endoscopy;  Laterality: N/A;    Medical History: Past Medical History:  Diagnosis Date  . Anemia   . Glaucoma   . Hyperlipidemia   . Hypertension   . Osteoporosis     Family History: Family History  Problem Relation Age of Onset  . Breast cancer Sister 76  . Atrial fibrillation Mother     Social History   Socioeconomic History  . Marital status: Married    Spouse name: Not on file  . Number of children: Not on file  . Years of education: Not on file  . Highest education level: Not on file  Social Needs  . Financial resource strain: Not on file  . Food insecurity - worry: Not on file  . Food insecurity - inability: Not on file  . Transportation needs - medical: Not on file  . Transportation needs - non-medical: Not on file  Occupational History  . Not on file  Tobacco Use  . Smoking status: Never Smoker  . Smokeless tobacco: Never Used  Substance and Sexual Activity  . Alcohol use: No    Frequency: Never  . Drug use:  No  . Sexual activity: Not on file  Other Topics Concern  . Not on file  Social History Narrative  . Not on file      Review of Systems  Constitutional: Negative for chills, fatigue and unexpected weight change.  HENT: Negative for congestion, postnasal drip, rhinorrhea, sneezing and sore throat.   Eyes: Negative for redness.  Respiratory: Negative for cough, chest tightness, shortness of breath and wheezing.   Cardiovascular: Negative for chest pain and palpitations.  Gastrointestinal: Negative for abdominal pain, constipation, diarrhea, nausea and vomiting.  Genitourinary: Negative for dysuria and  frequency.  Musculoskeletal: Negative for arthralgias, back pain, joint swelling and neck pain.  Skin: Negative for rash.       Has completed radiation treatment for skin cancer on tthe front aspect of both lower legs.will be discussing treatment on the back aspect of her legs with next visit to dermatology.   Neurological: Negative.  Negative for tremors and numbness.  Hematological: Negative for adenopathy. Does not bruise/bleed easily.  Psychiatric/Behavioral: Negative for behavioral problems (Depression), sleep disturbance and suicidal ideas. The patient is not nervous/anxious.     Today's Vitals   09/04/17 1348  BP: (!) 141/78  Pulse: 68  Resp: 16  SpO2: 96%  Weight: 104 lb 12.8 oz (47.5 kg)  Height: 5\' 2"  (1.575 m)    Physical Exam  Constitutional: She is oriented to person, place, and time. She appears well-developed and well-nourished. No distress.  HENT:  Head: Normocephalic and atraumatic.  Mouth/Throat: Oropharynx is clear and moist. No oropharyngeal exudate.  Eyes: EOM are normal. Pupils are equal, round, and reactive to light.  Neck: Normal range of motion. Neck supple. No JVD present. Carotid bruit is not present. No tracheal deviation present. No thyromegaly present.  Cardiovascular: Normal rate, regular rhythm and normal heart sounds. Exam reveals no gallop and no friction rub.  No murmur heard. Pulmonary/Chest: Effort normal and breath sounds normal. No respiratory distress. She has no wheezes. She has no rales. She exhibits no tenderness.  Abdominal: Soft. Bowel sounds are normal. There is no tenderness.  Musculoskeletal: Normal range of motion.  Lymphadenopathy:    She has no cervical adenopathy.  Neurological: She is alert and oriented to person, place, and time. No cranial nerve deficit.  Skin: Skin is warm and dry. She is not diaphoretic.  Psychiatric: She has a normal mood and affect. Her behavior is normal. Judgment and thought content normal.     Assessment/Plan:  1. B12 deficiency - cyanocobalamin ((VITAMIN B-12)) injection 1,000 mcg Will check anemia panel and continue monthly b12 injections as indicated.   2. Screening for breast cancer - MM Digital Screening; Future  3. Malignant neoplasm of skin of lower leg, unspecified laterality Continue regular visits with dermatology/oncology as scheduled.   General Counseling: Harumi verbalizes understanding of the findings of todays visit and agrees with plan of treatment. I have discussed any further diagnostic evaluation that may be needed or ordered today. We also reviewed her medications today. she has been encouraged to call the office with any questions or concerns that should arise related to todays visit.   This patient was seen by Leretha Pol, FNP- C in Collaboration with Dr Lavera Guise as a part of collaborative care agreement     Time spent: 52 Minutes          Dr Lavera Guise Internal medicine

## 2017-09-10 ENCOUNTER — Encounter: Payer: Self-pay | Admitting: Radiation Oncology

## 2017-09-10 ENCOUNTER — Other Ambulatory Visit: Payer: Self-pay

## 2017-09-10 ENCOUNTER — Ambulatory Visit
Admission: RE | Admit: 2017-09-10 | Discharge: 2017-09-10 | Disposition: A | Discharge status: Home / Self Care | Payer: PPO | Source: Ambulatory Visit | Attending: Radiation Oncology | Admitting: Radiation Oncology

## 2017-09-10 VITALS — BP 130/70 | HR 65 | Temp 97.7°F | Resp 20 | Wt 105.5 lb

## 2017-09-10 DIAGNOSIS — C44709 Unspecified malignant neoplasm of skin of left lower limb, including hip: Secondary | ICD-10-CM | POA: Insufficient documentation

## 2017-09-10 DIAGNOSIS — Z923 Personal history of irradiation: Secondary | ICD-10-CM | POA: Diagnosis not present

## 2017-09-10 DIAGNOSIS — C44721 Squamous cell carcinoma of skin of unspecified lower limb, including hip: Secondary | ICD-10-CM

## 2017-09-10 NOTE — Progress Notes (Signed)
Radiation Oncology Follow up Note  Name: Joann Warren   Date:   09/10/2017 MRN:  [id number] DOB: 1946/01/09    This 72 y.o. female presents to the clinic today for one-month follow-up status post electron beam therapy to her left lower extremity for widespread skin cancer.  REFERRING PROVIDER: Lavera Guise, MD  HPI: Patient is a 72 year old female who has previously received electron beam therapy to the anterior portion of her right lower extremity for widespread skin cancer. She is now 1 month out of treatment to her left lower extremity. Both areas of responded extremely well. Her dermatologist is asked to evaluate her posterior portion of both lower extremities for possible additional treatment..  COMPLICATIONS OF TREATMENT: none  FOLLOW UP COMPLIANCE: keeps appointments   PHYSICAL EXAM:  BP 130/70   Pulse 65   Temp 97.7 F (36.5 C)   Resp 20   Wt 105 lb 7.8 oz (47.8 kg)   BMI 19.29 kg/m  Both areas of the anterior lower extremities are responded extremely well with good resolution of multiple areas of skin cancer. There are some areas in the posterior lower extremities are progressing more on the right posterior lower extremity than the left. Well-developed well-nourished patient in NAD. HEENT reveals PERLA, EOMI, discs not visualized.  Oral cavity is clear. No oral mucosal lesions are identified. Neck is clear without evidence of cervical or supraclavicular adenopathy. Lungs are clear to A&P. Cardiac examination is essentially unremarkable with regular rate and rhythm without murmur rub or thrill. Abdomen is benign with no organomegaly or masses noted. Motor sensory and DTR levels are equal and symmetric in the upper and lower extremities. Cranial nerves II through XII are grossly intact. Proprioception is intact. No peripheral adenopathy or edema is identified. No motor or sensory levels are noted. Crude visual fields are within normal range.  RADIOLOGY RESULTS: No current  films for review  PLAN: Present time patient is doing well. I would like to touch upper posterior lower extremities although the patient would like to wait till after tax day on April 15 and we have scheduled follow-up at that time for reevaluation and treatment planning. I'm otherwise please were overall progress. Patient is to call sooner should any areas progress or start ulcerating.  I would like to take this opportunity to thank you for allowing me to participate in the care of your patient.Noreene Filbert, MD

## 2017-09-11 ENCOUNTER — Ambulatory Visit: Payer: PPO | Admitting: Radiation Oncology

## 2017-09-17 ENCOUNTER — Other Ambulatory Visit: Payer: Self-pay | Admitting: Internal Medicine

## 2017-09-18 ENCOUNTER — Ambulatory Visit: Payer: PPO | Admitting: Radiation Oncology

## 2017-10-15 ENCOUNTER — Ambulatory Visit (INDEPENDENT_AMBULATORY_CARE_PROVIDER_SITE_OTHER): Payer: PPO

## 2017-10-15 DIAGNOSIS — E538 Deficiency of other specified B group vitamins: Secondary | ICD-10-CM | POA: Diagnosis not present

## 2017-10-15 MED ORDER — CYANOCOBALAMIN 1000 MCG/ML IJ SOLN
1000.0000 ug | Freq: Once | INTRAMUSCULAR | Status: AC
  Administered 2017-10-15: 1000 ug via INTRAMUSCULAR

## 2017-10-22 ENCOUNTER — Other Ambulatory Visit: Payer: Self-pay

## 2017-11-12 ENCOUNTER — Ambulatory Visit: Payer: Self-pay

## 2017-12-01 ENCOUNTER — Ambulatory Visit (INDEPENDENT_AMBULATORY_CARE_PROVIDER_SITE_OTHER): Payer: PPO

## 2017-12-01 ENCOUNTER — Other Ambulatory Visit: Payer: Self-pay | Admitting: Nurse Practitioner

## 2017-12-01 DIAGNOSIS — E538 Deficiency of other specified B group vitamins: Secondary | ICD-10-CM | POA: Diagnosis not present

## 2017-12-01 DIAGNOSIS — D509 Iron deficiency anemia, unspecified: Secondary | ICD-10-CM | POA: Diagnosis not present

## 2017-12-02 ENCOUNTER — Other Ambulatory Visit: Payer: Self-pay | Admitting: Nurse Practitioner

## 2017-12-02 DIAGNOSIS — Z1231 Encounter for screening mammogram for malignant neoplasm of breast: Secondary | ICD-10-CM

## 2017-12-02 LAB — IRON AND TIBC
Iron Saturation: 20 % (ref 15–55)
Iron: 47 ug/dL (ref 27–139)
Total Iron Binding Capacity: 241 ug/dL — ABNORMAL LOW (ref 250–450)
UIBC: 194 ug/dL (ref 118–369)

## 2017-12-02 LAB — CBC
Hematocrit: 37.6 % (ref 34.0–46.6)
Hemoglobin: 12.4 g/dL (ref 11.1–15.9)
MCH: 28.6 pg (ref 26.6–33.0)
MCHC: 33 g/dL (ref 31.5–35.7)
MCV: 87 fL (ref 79–97)
Platelets: 278 10*3/uL (ref 150–379)
RBC: 4.34 x10E6/uL (ref 3.77–5.28)
RDW: 14.6 % (ref 12.3–15.4)
WBC: 6.6 10*3/uL (ref 3.4–10.8)

## 2017-12-02 LAB — B12 AND FOLATE PANEL
Folate: 3.7 ng/mL (ref >3.0)
Vitamin B-12: 1532 pg/mL — ABNORMAL HIGH (ref 232–1245)

## 2017-12-02 LAB — FERRITIN: Ferritin: 74 ng/mL (ref 15–150)

## 2017-12-11 ENCOUNTER — Other Ambulatory Visit: Payer: Self-pay

## 2017-12-11 ENCOUNTER — Ambulatory Visit
Admission: RE | Admit: 2017-12-11 | Discharge: 2017-12-11 | Disposition: A | Discharge status: Home / Self Care | Payer: PPO | Source: Ambulatory Visit | Attending: Radiation Oncology | Admitting: Radiation Oncology

## 2017-12-11 VITALS — BP 119/67 | HR 69 | Temp 97.8°F | Resp 12 | Ht 62.0 in | Wt 104.2 lb

## 2017-12-11 DIAGNOSIS — C44722 Squamous cell carcinoma of skin of right lower limb, including hip: Secondary | ICD-10-CM | POA: Diagnosis not present

## 2017-12-11 DIAGNOSIS — Z923 Personal history of irradiation: Secondary | ICD-10-CM | POA: Diagnosis not present

## 2017-12-11 DIAGNOSIS — C44721 Squamous cell carcinoma of skin of unspecified lower limb, including hip: Secondary | ICD-10-CM | POA: Insufficient documentation

## 2017-12-11 DIAGNOSIS — C44729 Squamous cell carcinoma of skin of left lower limb, including hip: Secondary | ICD-10-CM | POA: Diagnosis not present

## 2017-12-11 NOTE — Progress Notes (Signed)
Radiation Oncology Follow up Note  Name: Joann Warren   Date:   12/11/2017 MRN:  [id number] DOB: 1946-08-09    This 72 y.o. female presents to the clinic today for follow-up on electron beam therapy to her lower extremities for widespread skin cancer.Marland Kitchen  REFERRING PROVIDER: Lavera Guise, MD  HPI: patient is a 72 year old female who is receive bilateral lower extremity anterior electron beam fields for widespread skin cancer. She's done extremely well with resolution of most of those lesions. She still has prominent disease on the posterior aspects of both lower extremities and is seeking treatment to those areas today. She is otherwise doing well. She's having no lower extremity swelling discomfort or pain..  COMPLICATIONS OF TREATMENT: none  FOLLOW UP COMPLIANCE: keeps appointments   PHYSICAL EXAM:  BP 119/67 (BP Location: Left Arm, Patient Position: Sitting, Cuff Size: Small)   Pulse 69   Temp 97.8 F (36.6 C) (Tympanic)   Resp 12   Ht 5\' 2"  (1.575 m)   Wt 104 lb 2.7 oz (47.3 kg)   BMI 19.05 kg/m  Still some residual lesions on the anterior surface of her lower extremities although there are marked lesions throughout widespread areas of the lower extremities. No inguinal adenopathy is identified no peripheral edema in her lower extremities is noted. Well-developed well-nourished patient in NAD. HEENT reveals PERLA, EOMI, discs not visualized.  Oral cavity is clear. No oral mucosal lesions are identified. Neck is clear without evidence of cervical or supraclavicular adenopathy. Lungs are clear to A&P. Cardiac examination is essentially unremarkable with regular rate and rhythm without murmur rub or thrill. Abdomen is benign with no organomegaly or masses noted. Motor sensory and DTR levels are equal and symmetric in the upper and lower extremities. Cranial nerves II through XII are grossly intact. Proprioception is intact. No peripheral adenopathy or edema is identified. No motor or  sensory levels are noted. Crude visual fields are within normal range.  RADIOLOGY RESULTS: no current films for review  PLAN: at this time we'll go ahead with electron beam therapy to her right lower extremity. Will give short treatment break in complete to the left lower extremity. Patient is undergone this before so is aware the risks and benefits of treatment which I have again reviewed.I have personally set up and ordered CT simulation for next week.again I plan on delivering 5000 cGy in 25 fractions with electron beam therapy similar to her prior treatments.  I would like to take this opportunity to thank you for allowing me to participate in the care of your patient.Noreene Filbert, MD

## 2017-12-17 ENCOUNTER — Ambulatory Visit
Admission: RE | Admit: 2017-12-17 | Discharge: 2017-12-17 | Disposition: A | Discharge status: Home / Self Care | Payer: PPO | Source: Ambulatory Visit | Attending: Radiation Oncology | Admitting: Radiation Oncology

## 2017-12-17 DIAGNOSIS — Z510 Encounter for antineoplastic radiation therapy: Secondary | ICD-10-CM | POA: Insufficient documentation

## 2017-12-17 DIAGNOSIS — C44722 Squamous cell carcinoma of skin of right lower limb, including hip: Secondary | ICD-10-CM | POA: Diagnosis not present

## 2017-12-17 DIAGNOSIS — C44729 Squamous cell carcinoma of skin of left lower limb, including hip: Secondary | ICD-10-CM | POA: Diagnosis not present

## 2017-12-18 DIAGNOSIS — C44722 Squamous cell carcinoma of skin of right lower limb, including hip: Secondary | ICD-10-CM | POA: Diagnosis not present

## 2017-12-18 DIAGNOSIS — C44729 Squamous cell carcinoma of skin of left lower limb, including hip: Secondary | ICD-10-CM | POA: Diagnosis not present

## 2017-12-18 DIAGNOSIS — Z510 Encounter for antineoplastic radiation therapy: Secondary | ICD-10-CM | POA: Diagnosis not present

## 2017-12-21 ENCOUNTER — Ambulatory Visit
Admission: RE | Admit: 2017-12-21 | Discharge: 2017-12-21 | Disposition: A | Discharge status: Home / Self Care | Payer: PPO | Source: Ambulatory Visit | Attending: Nurse Practitioner | Admitting: Nurse Practitioner

## 2017-12-21 DIAGNOSIS — C44629 Squamous cell carcinoma of skin of left upper limb, including shoulder: Secondary | ICD-10-CM | POA: Diagnosis not present

## 2017-12-21 DIAGNOSIS — D485 Neoplasm of uncertain behavior of skin: Secondary | ICD-10-CM | POA: Diagnosis not present

## 2017-12-21 DIAGNOSIS — C44729 Squamous cell carcinoma of skin of left lower limb, including hip: Secondary | ICD-10-CM | POA: Diagnosis not present

## 2017-12-21 DIAGNOSIS — Z1231 Encounter for screening mammogram for malignant neoplasm of breast: Secondary | ICD-10-CM

## 2017-12-21 DIAGNOSIS — C44622 Squamous cell carcinoma of skin of right upper limb, including shoulder: Secondary | ICD-10-CM | POA: Diagnosis not present

## 2017-12-21 DIAGNOSIS — Z85828 Personal history of other malignant neoplasm of skin: Secondary | ICD-10-CM | POA: Diagnosis not present

## 2017-12-21 DIAGNOSIS — L578 Other skin changes due to chronic exposure to nonionizing radiation: Secondary | ICD-10-CM | POA: Diagnosis not present

## 2017-12-24 ENCOUNTER — Ambulatory Visit
Admission: RE | Admit: 2017-12-24 | Discharge: 2017-12-24 | Disposition: A | Discharge status: Home / Self Care | Payer: PPO | Source: Ambulatory Visit | Attending: Radiation Oncology | Admitting: Radiation Oncology

## 2017-12-24 DIAGNOSIS — Z510 Encounter for antineoplastic radiation therapy: Secondary | ICD-10-CM | POA: Diagnosis not present

## 2017-12-25 ENCOUNTER — Ambulatory Visit
Admission: RE | Admit: 2017-12-25 | Discharge: 2017-12-25 | Disposition: A | Discharge status: Home / Self Care | Payer: PPO | Source: Ambulatory Visit | Attending: Radiation Oncology | Admitting: Radiation Oncology

## 2017-12-25 DIAGNOSIS — Z510 Encounter for antineoplastic radiation therapy: Secondary | ICD-10-CM | POA: Diagnosis not present

## 2017-12-28 ENCOUNTER — Ambulatory Visit
Admission: RE | Admit: 2017-12-28 | Discharge: 2017-12-28 | Disposition: A | Discharge status: Home / Self Care | Payer: PPO | Source: Ambulatory Visit | Attending: Radiation Oncology | Admitting: Radiation Oncology

## 2017-12-28 DIAGNOSIS — Z510 Encounter for antineoplastic radiation therapy: Secondary | ICD-10-CM | POA: Diagnosis not present

## 2017-12-29 ENCOUNTER — Ambulatory Visit
Admission: RE | Admit: 2017-12-29 | Discharge: 2017-12-29 | Disposition: A | Discharge status: Home / Self Care | Payer: PPO | Source: Ambulatory Visit | Attending: Radiation Oncology | Admitting: Radiation Oncology

## 2017-12-29 DIAGNOSIS — Z510 Encounter for antineoplastic radiation therapy: Secondary | ICD-10-CM | POA: Diagnosis not present

## 2017-12-30 ENCOUNTER — Ambulatory Visit (INDEPENDENT_AMBULATORY_CARE_PROVIDER_SITE_OTHER): Payer: PPO

## 2017-12-30 ENCOUNTER — Ambulatory Visit
Admission: RE | Admit: 2017-12-30 | Discharge: 2017-12-30 | Disposition: A | Discharge status: Home / Self Care | Payer: PPO | Source: Ambulatory Visit | Attending: Radiation Oncology | Admitting: Radiation Oncology

## 2017-12-30 DIAGNOSIS — C44729 Squamous cell carcinoma of skin of left lower limb, including hip: Secondary | ICD-10-CM | POA: Diagnosis not present

## 2017-12-30 DIAGNOSIS — Z510 Encounter for antineoplastic radiation therapy: Secondary | ICD-10-CM | POA: Diagnosis not present

## 2017-12-30 DIAGNOSIS — E538 Deficiency of other specified B group vitamins: Secondary | ICD-10-CM

## 2017-12-30 DIAGNOSIS — C44722 Squamous cell carcinoma of skin of right lower limb, including hip: Secondary | ICD-10-CM | POA: Diagnosis not present

## 2017-12-30 MED ORDER — CYANOCOBALAMIN 1000 MCG/ML IJ SOLN
1000.0000 ug | Freq: Once | INTRAMUSCULAR | Status: AC
  Administered 2017-12-30: 1000 ug via INTRAMUSCULAR

## 2017-12-30 NOTE — Progress Notes (Signed)
cyanocb

## 2017-12-31 ENCOUNTER — Ambulatory Visit
Admission: RE | Admit: 2017-12-31 | Discharge: 2017-12-31 | Disposition: A | Discharge status: Home / Self Care | Payer: PPO | Source: Ambulatory Visit | Attending: Radiation Oncology | Admitting: Radiation Oncology

## 2017-12-31 DIAGNOSIS — Z510 Encounter for antineoplastic radiation therapy: Secondary | ICD-10-CM | POA: Diagnosis not present

## 2018-01-01 ENCOUNTER — Ambulatory Visit
Admission: RE | Admit: 2018-01-01 | Discharge: 2018-01-01 | Disposition: A | Discharge status: Home / Self Care | Payer: PPO | Source: Ambulatory Visit | Attending: Radiation Oncology | Admitting: Radiation Oncology

## 2018-01-01 DIAGNOSIS — Z510 Encounter for antineoplastic radiation therapy: Secondary | ICD-10-CM | POA: Diagnosis not present

## 2018-01-04 ENCOUNTER — Ambulatory Visit
Admission: RE | Admit: 2018-01-04 | Discharge: 2018-01-04 | Disposition: A | Discharge status: Home / Self Care | Payer: PPO | Source: Ambulatory Visit | Attending: Radiation Oncology | Admitting: Radiation Oncology

## 2018-01-04 DIAGNOSIS — Z510 Encounter for antineoplastic radiation therapy: Secondary | ICD-10-CM | POA: Diagnosis not present

## 2018-01-05 ENCOUNTER — Ambulatory Visit
Admission: RE | Admit: 2018-01-05 | Discharge: 2018-01-05 | Disposition: A | Discharge status: Home / Self Care | Payer: PPO | Source: Ambulatory Visit | Attending: Radiation Oncology | Admitting: Radiation Oncology

## 2018-01-05 DIAGNOSIS — Z510 Encounter for antineoplastic radiation therapy: Secondary | ICD-10-CM | POA: Diagnosis not present

## 2018-01-06 ENCOUNTER — Ambulatory Visit
Admission: RE | Admit: 2018-01-06 | Discharge: 2018-01-06 | Disposition: A | Discharge status: Home / Self Care | Payer: PPO | Source: Ambulatory Visit | Attending: Radiation Oncology | Admitting: Radiation Oncology

## 2018-01-06 DIAGNOSIS — C44729 Squamous cell carcinoma of skin of left lower limb, including hip: Secondary | ICD-10-CM | POA: Diagnosis not present

## 2018-01-06 DIAGNOSIS — C44722 Squamous cell carcinoma of skin of right lower limb, including hip: Secondary | ICD-10-CM | POA: Diagnosis not present

## 2018-01-06 DIAGNOSIS — Z510 Encounter for antineoplastic radiation therapy: Secondary | ICD-10-CM | POA: Diagnosis not present

## 2018-01-07 ENCOUNTER — Ambulatory Visit
Admission: RE | Admit: 2018-01-07 | Discharge: 2018-01-07 | Disposition: A | Discharge status: Home / Self Care | Payer: PPO | Source: Ambulatory Visit | Attending: Radiation Oncology | Admitting: Radiation Oncology

## 2018-01-07 DIAGNOSIS — D485 Neoplasm of uncertain behavior of skin: Secondary | ICD-10-CM | POA: Diagnosis not present

## 2018-01-07 DIAGNOSIS — L578 Other skin changes due to chronic exposure to nonionizing radiation: Secondary | ICD-10-CM | POA: Diagnosis not present

## 2018-01-07 DIAGNOSIS — C44729 Squamous cell carcinoma of skin of left lower limb, including hip: Secondary | ICD-10-CM | POA: Diagnosis not present

## 2018-01-07 DIAGNOSIS — L570 Actinic keratosis: Secondary | ICD-10-CM | POA: Diagnosis not present

## 2018-01-07 DIAGNOSIS — Z510 Encounter for antineoplastic radiation therapy: Secondary | ICD-10-CM | POA: Diagnosis not present

## 2018-01-07 DIAGNOSIS — C44529 Squamous cell carcinoma of skin of other part of trunk: Secondary | ICD-10-CM | POA: Diagnosis not present

## 2018-01-07 DIAGNOSIS — D0472 Carcinoma in situ of skin of left lower limb, including hip: Secondary | ICD-10-CM | POA: Diagnosis not present

## 2018-01-07 DIAGNOSIS — Z85828 Personal history of other malignant neoplasm of skin: Secondary | ICD-10-CM | POA: Diagnosis not present

## 2018-01-08 ENCOUNTER — Ambulatory Visit
Admission: RE | Admit: 2018-01-08 | Discharge: 2018-01-08 | Disposition: A | Discharge status: Home / Self Care | Payer: PPO | Source: Ambulatory Visit | Attending: Radiation Oncology | Admitting: Radiation Oncology

## 2018-01-08 DIAGNOSIS — Z510 Encounter for antineoplastic radiation therapy: Secondary | ICD-10-CM | POA: Diagnosis not present

## 2018-01-12 ENCOUNTER — Other Ambulatory Visit: Payer: Self-pay | Admitting: *Deleted

## 2018-01-12 ENCOUNTER — Ambulatory Visit
Admission: RE | Admit: 2018-01-12 | Discharge: 2018-01-12 | Disposition: A | Discharge status: Home / Self Care | Payer: PPO | Source: Ambulatory Visit | Attending: Radiation Oncology | Admitting: Radiation Oncology

## 2018-01-12 DIAGNOSIS — Z510 Encounter for antineoplastic radiation therapy: Secondary | ICD-10-CM | POA: Diagnosis not present

## 2018-01-12 MED ORDER — SILVER SULFADIAZINE 1 % EX CREA: 1.0000 "application " | TOPICAL_CREAM | Freq: Two times a day (BID) | CUTANEOUS | 4 refills | Status: DC

## 2018-01-13 ENCOUNTER — Ambulatory Visit
Admission: RE | Admit: 2018-01-13 | Discharge: 2018-01-13 | Disposition: A | Discharge status: Home / Self Care | Payer: PPO | Source: Ambulatory Visit | Attending: Radiation Oncology | Admitting: Radiation Oncology

## 2018-01-13 DIAGNOSIS — Z510 Encounter for antineoplastic radiation therapy: Secondary | ICD-10-CM | POA: Diagnosis not present

## 2018-01-14 ENCOUNTER — Ambulatory Visit
Admission: RE | Admit: 2018-01-14 | Discharge: 2018-01-14 | Disposition: A | Discharge status: Home / Self Care | Payer: PPO | Source: Ambulatory Visit | Attending: Radiation Oncology | Admitting: Radiation Oncology

## 2018-01-14 DIAGNOSIS — C44729 Squamous cell carcinoma of skin of left lower limb, including hip: Secondary | ICD-10-CM | POA: Diagnosis not present

## 2018-01-14 DIAGNOSIS — Z510 Encounter for antineoplastic radiation therapy: Secondary | ICD-10-CM | POA: Diagnosis not present

## 2018-01-14 DIAGNOSIS — C44722 Squamous cell carcinoma of skin of right lower limb, including hip: Secondary | ICD-10-CM | POA: Diagnosis not present

## 2018-01-15 ENCOUNTER — Ambulatory Visit
Admission: RE | Admit: 2018-01-15 | Discharge: 2018-01-15 | Disposition: A | Discharge status: Home / Self Care | Payer: PPO | Source: Ambulatory Visit | Attending: Radiation Oncology | Admitting: Radiation Oncology

## 2018-01-15 DIAGNOSIS — Z510 Encounter for antineoplastic radiation therapy: Secondary | ICD-10-CM | POA: Diagnosis not present

## 2018-01-18 ENCOUNTER — Ambulatory Visit
Admission: RE | Admit: 2018-01-18 | Discharge: 2018-01-18 | Disposition: A | Discharge status: Home / Self Care | Payer: PPO | Source: Ambulatory Visit | Attending: Radiation Oncology | Admitting: Radiation Oncology

## 2018-01-18 DIAGNOSIS — Z510 Encounter for antineoplastic radiation therapy: Secondary | ICD-10-CM | POA: Insufficient documentation

## 2018-01-18 DIAGNOSIS — C44722 Squamous cell carcinoma of skin of right lower limb, including hip: Secondary | ICD-10-CM | POA: Diagnosis not present

## 2018-01-19 ENCOUNTER — Ambulatory Visit
Admission: RE | Admit: 2018-01-19 | Discharge: 2018-01-19 | Disposition: A | Discharge status: Home / Self Care | Payer: PPO | Source: Ambulatory Visit | Attending: Radiation Oncology | Admitting: Radiation Oncology

## 2018-01-19 DIAGNOSIS — Z510 Encounter for antineoplastic radiation therapy: Secondary | ICD-10-CM | POA: Diagnosis not present

## 2018-01-20 ENCOUNTER — Ambulatory Visit
Admission: RE | Admit: 2018-01-20 | Discharge: 2018-01-20 | Disposition: A | Discharge status: Home / Self Care | Payer: PPO | Source: Ambulatory Visit | Attending: Radiation Oncology | Admitting: Radiation Oncology

## 2018-01-20 DIAGNOSIS — Z510 Encounter for antineoplastic radiation therapy: Secondary | ICD-10-CM | POA: Diagnosis not present

## 2018-01-21 ENCOUNTER — Ambulatory Visit
Admission: RE | Admit: 2018-01-21 | Discharge: 2018-01-21 | Disposition: A | Discharge status: Home / Self Care | Payer: PPO | Source: Ambulatory Visit | Attending: Radiation Oncology | Admitting: Radiation Oncology

## 2018-01-21 DIAGNOSIS — Z510 Encounter for antineoplastic radiation therapy: Secondary | ICD-10-CM | POA: Diagnosis not present

## 2018-01-21 DIAGNOSIS — C44722 Squamous cell carcinoma of skin of right lower limb, including hip: Secondary | ICD-10-CM | POA: Diagnosis not present

## 2018-01-21 DIAGNOSIS — C44729 Squamous cell carcinoma of skin of left lower limb, including hip: Secondary | ICD-10-CM | POA: Diagnosis not present

## 2018-01-22 ENCOUNTER — Ambulatory Visit: Payer: PPO

## 2018-01-25 ENCOUNTER — Ambulatory Visit
Admission: RE | Admit: 2018-01-25 | Discharge: 2018-01-25 | Disposition: A | Discharge status: Home / Self Care | Payer: PPO | Source: Ambulatory Visit | Attending: Radiation Oncology | Admitting: Radiation Oncology

## 2018-01-25 DIAGNOSIS — Z510 Encounter for antineoplastic radiation therapy: Secondary | ICD-10-CM | POA: Diagnosis not present

## 2018-01-26 ENCOUNTER — Other Ambulatory Visit: Payer: Self-pay | Admitting: *Deleted

## 2018-01-26 ENCOUNTER — Ambulatory Visit
Admission: RE | Admit: 2018-01-26 | Discharge: 2018-01-26 | Disposition: A | Discharge status: Home / Self Care | Payer: PPO | Source: Ambulatory Visit | Attending: Radiation Oncology | Admitting: Radiation Oncology

## 2018-01-26 DIAGNOSIS — Z510 Encounter for antineoplastic radiation therapy: Secondary | ICD-10-CM | POA: Diagnosis not present

## 2018-01-26 MED ORDER — HYDROCODONE-ACETAMINOPHEN 5-325 MG PO TABS: 1.0000 | ORAL_TABLET | Freq: Four times a day (QID) | ORAL | 0 refills | Status: DC | PRN

## 2018-01-27 ENCOUNTER — Ambulatory Visit (INDEPENDENT_AMBULATORY_CARE_PROVIDER_SITE_OTHER): Payer: PPO

## 2018-01-27 ENCOUNTER — Ambulatory Visit
Admission: RE | Admit: 2018-01-27 | Discharge: 2018-01-27 | Disposition: A | Discharge status: Home / Self Care | Payer: PPO | Source: Ambulatory Visit | Attending: Radiation Oncology | Admitting: Radiation Oncology

## 2018-01-27 DIAGNOSIS — Z510 Encounter for antineoplastic radiation therapy: Secondary | ICD-10-CM | POA: Diagnosis not present

## 2018-01-27 DIAGNOSIS — E538 Deficiency of other specified B group vitamins: Secondary | ICD-10-CM | POA: Diagnosis not present

## 2018-01-27 MED ORDER — CYANOCOBALAMIN 1000 MCG/ML IJ SOLN
1000.0000 ug | Freq: Once | INTRAMUSCULAR | Status: AC
  Administered 2018-01-27: 1000 ug via INTRAMUSCULAR

## 2018-01-28 ENCOUNTER — Ambulatory Visit
Admission: RE | Admit: 2018-01-28 | Discharge: 2018-01-28 | Disposition: A | Discharge status: Home / Self Care | Payer: PPO | Source: Ambulatory Visit | Attending: Radiation Oncology | Admitting: Radiation Oncology

## 2018-01-28 DIAGNOSIS — H401112 Primary open-angle glaucoma, right eye, moderate stage: Secondary | ICD-10-CM | POA: Diagnosis not present

## 2018-01-28 DIAGNOSIS — Z510 Encounter for antineoplastic radiation therapy: Secondary | ICD-10-CM | POA: Diagnosis not present

## 2018-01-29 ENCOUNTER — Ambulatory Visit
Admission: RE | Admit: 2018-01-29 | Discharge: 2018-01-29 | Disposition: A | Discharge status: Home / Self Care | Payer: PPO | Source: Ambulatory Visit | Attending: Radiation Oncology | Admitting: Radiation Oncology

## 2018-01-29 DIAGNOSIS — C44729 Squamous cell carcinoma of skin of left lower limb, including hip: Secondary | ICD-10-CM | POA: Diagnosis not present

## 2018-01-29 DIAGNOSIS — C44722 Squamous cell carcinoma of skin of right lower limb, including hip: Secondary | ICD-10-CM | POA: Diagnosis not present

## 2018-01-29 DIAGNOSIS — Z510 Encounter for antineoplastic radiation therapy: Secondary | ICD-10-CM | POA: Diagnosis not present

## 2018-02-01 ENCOUNTER — Other Ambulatory Visit: Payer: Self-pay | Admitting: *Deleted

## 2018-02-02 ENCOUNTER — Telehealth: Payer: Self-pay | Admitting: *Deleted

## 2018-02-02 ENCOUNTER — Other Ambulatory Visit: Payer: Self-pay | Admitting: *Deleted

## 2018-02-02 MED ORDER — SILVER SULFADIAZINE 1 % EX CREA: 1.0000 "application " | TOPICAL_CREAM | Freq: Two times a day (BID) | CUTANEOUS | 2 refills | Status: DC

## 2018-02-02 MED ORDER — HYDROCODONE-ACETAMINOPHEN 5-325 MG PO TABS: 1.0000 | ORAL_TABLET | Freq: Four times a day (QID) | ORAL | 0 refills | Status: DC | PRN

## 2018-02-02 NOTE — Telephone Encounter (Signed)
[employer] called and states that they are still not able to get patient SSD cream to go through after being told by daughter that insurance has approved it. Please call them regarding this [phone]

## 2018-02-08 ENCOUNTER — Other Ambulatory Visit: Payer: Self-pay | Admitting: *Deleted

## 2018-02-08 DIAGNOSIS — D485 Neoplasm of uncertain behavior of skin: Secondary | ICD-10-CM | POA: Diagnosis not present

## 2018-02-08 DIAGNOSIS — C44511 Basal cell carcinoma of skin of breast: Secondary | ICD-10-CM | POA: Diagnosis not present

## 2018-02-08 DIAGNOSIS — L578 Other skin changes due to chronic exposure to nonionizing radiation: Secondary | ICD-10-CM | POA: Diagnosis not present

## 2018-02-08 DIAGNOSIS — Z85828 Personal history of other malignant neoplasm of skin: Secondary | ICD-10-CM | POA: Diagnosis not present

## 2018-02-12 ENCOUNTER — Encounter: Payer: Self-pay | Admitting: Radiation Oncology

## 2018-02-12 ENCOUNTER — Other Ambulatory Visit: Payer: Self-pay

## 2018-02-12 ENCOUNTER — Ambulatory Visit
Admission: RE | Admit: 2018-02-12 | Discharge: 2018-02-12 | Disposition: A | Discharge status: Home / Self Care | Payer: PPO | Source: Ambulatory Visit | Attending: Radiation Oncology | Admitting: Radiation Oncology

## 2018-02-12 VITALS — BP 127/64 | HR 69 | Temp 97.0°F | Resp 20 | Wt 105.7 lb

## 2018-02-12 DIAGNOSIS — Z08 Encounter for follow-up examination after completed treatment for malignant neoplasm: Secondary | ICD-10-CM | POA: Insufficient documentation

## 2018-02-12 DIAGNOSIS — C44721 Squamous cell carcinoma of skin of unspecified lower limb, including hip: Secondary | ICD-10-CM

## 2018-02-12 DIAGNOSIS — C44722 Squamous cell carcinoma of skin of right lower limb, including hip: Secondary | ICD-10-CM | POA: Insufficient documentation

## 2018-02-12 NOTE — Progress Notes (Signed)
Radiation Oncology Follow up Note  Name: Joann Warren   Date:   02/12/2018 MRN:  [id number] DOB: 05-Jun-1946    This 72 y.o. female presents to the clinic today for two-week follow-up status post electron beam therapy to her posterior. ight lower extremity  REFERRING PROVIDER: Lavera Guise, MD  HPI: patient is now completed bilateral anterior right lower extremity electron beam and is 2 weeks out from right lower extremity posterior treatment. She has significant scabbing but excellent resolution of all of known lesions. She does still have a fewlesions growing in between the 2 electron beam fields which we will treat with supper fields in the future. She is currently not on narcotic analgesics anymore for pain..  COMPLICATIONS OF TREATMENT: none  FOLLOW UP COMPLIANCE: keeps appointments   PHYSICAL EXAM:  BP 127/64   Pulse 69   Temp (!) 97 F (36.1 C)   Resp 20   Wt 105 lb 11.4 oz (48 kg)   BMI 19.33 kg/m  Excellent response electron beam therapy with scab formation and hyperpigmentation and dry disc vision the skin in her treatment fields. Well-developed well-nourished patient in NAD. HEENT reveals PERLA, EOMI, discs not visualized.  Oral cavity is clear. No oral mucosal lesions are identified. Neck is clear without evidence of cervical or supraclavicular adenopathy. Lungs are clear to A&P. Cardiac examination is essentially unremarkable with regular rate and rhythm without murmur rub or thrill. Abdomen is benign with no organomegaly or masses noted. Motor sensory and DTR levels are equal and symmetric in the upper and lower extremities. Cranial nerves II through XII are grossly intact. Proprioception is intact. No peripheral adenopathy or edema is identified. No motor or sensory levels are noted. Crude visual fields are within normal range.  RADIOLOGY RESULTS: no current films for review  PLAN: the present time we'll plan on the delivering electron beam to her posterior left lower  extremity. She is going on vacation couple weeks with [employer] for treatment planning after she returns. I also will reevaluate of the next several weeks any further areas of treatment her lower extremities that need our attention. Patient is comfortable with our treatment plan. She continues using Silvadine cream. She knows to call with any concerns.  I would like to take this opportunity to thank you for allowing me to participate in the care of your patient.Noreene Filbert, MD

## 2018-03-02 ENCOUNTER — Ambulatory Visit
Admission: RE | Admit: 2018-03-02 | Discharge: 2018-03-02 | Disposition: A | Discharge status: Home / Self Care | Payer: PPO | Source: Ambulatory Visit | Attending: Radiation Oncology | Admitting: Radiation Oncology

## 2018-03-02 DIAGNOSIS — Z510 Encounter for antineoplastic radiation therapy: Secondary | ICD-10-CM | POA: Diagnosis not present

## 2018-03-02 DIAGNOSIS — C44722 Squamous cell carcinoma of skin of right lower limb, including hip: Secondary | ICD-10-CM | POA: Insufficient documentation

## 2018-03-02 DIAGNOSIS — C44729 Squamous cell carcinoma of skin of left lower limb, including hip: Secondary | ICD-10-CM | POA: Diagnosis not present

## 2018-03-03 ENCOUNTER — Encounter: Payer: Self-pay | Admitting: Internal Medicine

## 2018-03-03 ENCOUNTER — Other Ambulatory Visit: Payer: Self-pay | Admitting: Internal Medicine

## 2018-03-03 DIAGNOSIS — C44722 Squamous cell carcinoma of skin of right lower limb, including hip: Secondary | ICD-10-CM | POA: Diagnosis not present

## 2018-03-03 DIAGNOSIS — C44729 Squamous cell carcinoma of skin of left lower limb, including hip: Secondary | ICD-10-CM | POA: Diagnosis not present

## 2018-03-03 DIAGNOSIS — Z510 Encounter for antineoplastic radiation therapy: Secondary | ICD-10-CM | POA: Diagnosis not present

## 2018-03-08 DIAGNOSIS — Z85828 Personal history of other malignant neoplasm of skin: Secondary | ICD-10-CM | POA: Diagnosis not present

## 2018-03-08 DIAGNOSIS — C44722 Squamous cell carcinoma of skin of right lower limb, including hip: Secondary | ICD-10-CM | POA: Diagnosis not present

## 2018-03-08 DIAGNOSIS — C44622 Squamous cell carcinoma of skin of right upper limb, including shoulder: Secondary | ICD-10-CM | POA: Diagnosis not present

## 2018-03-08 DIAGNOSIS — C44629 Squamous cell carcinoma of skin of left upper limb, including shoulder: Secondary | ICD-10-CM | POA: Diagnosis not present

## 2018-03-08 DIAGNOSIS — L578 Other skin changes due to chronic exposure to nonionizing radiation: Secondary | ICD-10-CM | POA: Diagnosis not present

## 2018-03-08 DIAGNOSIS — D485 Neoplasm of uncertain behavior of skin: Secondary | ICD-10-CM | POA: Diagnosis not present

## 2018-03-09 DIAGNOSIS — Z510 Encounter for antineoplastic radiation therapy: Secondary | ICD-10-CM | POA: Diagnosis not present

## 2018-03-10 ENCOUNTER — Ambulatory Visit: Payer: Self-pay | Admitting: Internal Medicine

## 2018-03-10 ENCOUNTER — Ambulatory Visit
Admission: RE | Admit: 2018-03-10 | Discharge: 2018-03-10 | Disposition: A | Discharge status: Home / Self Care | Payer: PPO | Source: Ambulatory Visit | Attending: Radiation Oncology | Admitting: Radiation Oncology

## 2018-03-10 DIAGNOSIS — Z510 Encounter for antineoplastic radiation therapy: Secondary | ICD-10-CM | POA: Diagnosis not present

## 2018-03-11 ENCOUNTER — Ambulatory Visit: Payer: PPO

## 2018-03-12 ENCOUNTER — Ambulatory Visit (INDEPENDENT_AMBULATORY_CARE_PROVIDER_SITE_OTHER): Payer: PPO | Admitting: Adult Health

## 2018-03-12 ENCOUNTER — Ambulatory Visit
Admission: RE | Admit: 2018-03-12 | Discharge: 2018-03-12 | Disposition: A | Discharge status: Home / Self Care | Payer: PPO | Source: Ambulatory Visit | Attending: Radiation Oncology | Admitting: Radiation Oncology

## 2018-03-12 ENCOUNTER — Encounter: Payer: Self-pay | Admitting: Adult Health

## 2018-03-12 ENCOUNTER — Other Ambulatory Visit: Payer: Self-pay | Admitting: Internal Medicine

## 2018-03-12 VITALS — BP 138/60 | HR 63 | Resp 16 | Ht 62.0 in | Wt 106.0 lb

## 2018-03-12 DIAGNOSIS — E538 Deficiency of other specified B group vitamins: Secondary | ICD-10-CM

## 2018-03-12 DIAGNOSIS — E785 Hyperlipidemia, unspecified: Secondary | ICD-10-CM

## 2018-03-12 DIAGNOSIS — C44701 Unspecified malignant neoplasm of skin of unspecified lower limb, including hip: Secondary | ICD-10-CM | POA: Diagnosis not present

## 2018-03-12 DIAGNOSIS — R300 Dysuria: Secondary | ICD-10-CM

## 2018-03-12 DIAGNOSIS — Z510 Encounter for antineoplastic radiation therapy: Secondary | ICD-10-CM | POA: Diagnosis not present

## 2018-03-12 DIAGNOSIS — I10 Essential (primary) hypertension: Secondary | ICD-10-CM | POA: Diagnosis not present

## 2018-03-12 DIAGNOSIS — Z0001 Encounter for general adult medical examination with abnormal findings: Secondary | ICD-10-CM

## 2018-03-12 DIAGNOSIS — D0470 Carcinoma in situ of skin of unspecified lower limb, including hip: Secondary | ICD-10-CM | POA: Insufficient documentation

## 2018-03-12 DIAGNOSIS — C4492 Squamous cell carcinoma of skin, unspecified: Secondary | ICD-10-CM | POA: Insufficient documentation

## 2018-03-12 MED ORDER — SIMVASTATIN 20 MG PO TABS: 20.0000 mg | ORAL_TABLET | Freq: Every day | ORAL | 2 refills | Status: DC

## 2018-03-12 MED ORDER — AMLODIPINE BESYLATE 5 MG PO TABS: 5.0000 mg | ORAL_TABLET | Freq: Every day | ORAL | 2 refills | Status: DC

## 2018-03-12 NOTE — Progress Notes (Signed)
[facility] [place], [place] [address]  Internal MEDICINE  Office Visit Note  Patient Name: Joann Warren  [id number]  [id number]  Date of Service: 03/12/2018  Chief Complaint  Patient presents with  . Annual Exam    medicare visit      HPI Pt is here for routine health maintenance examination.  She denies complaints currently.  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 She also has hx of htn and hyperlipidemia.     Current Medication: Outpatient Encounter Medications as of 03/12/2018  Medication Sig  . alendronate (FOSAMAX) 70 MG tablet TAKE 1 TABLET EVERY 7 DAYS WITH A FULL GLASS OF WATER ON AN EMPTY STOMACH DO NOT LIE DOWN FOR AT LEAST 30 MIN  . amLODipine (NORVASC) 5 MG tablet TAKE ONE TABLET EVERY DAY  . bisoprolol-hydrochlorothiazide (ZIAC) 2.5-6.25 MG per tablet Take 1 tablet by mouth daily.  . hydrOXYzine (ATARAX/VISTARIL) 10 MG tablet Take 10 mg by mouth 3 (three) times daily as needed.  . simvastatin (ZOCOR) 20 MG tablet TAKE ONE TABLET AT BEDTIME  . [DISCONTINUED] HYDROcodone-acetaminophen (NORCO) 5-325 MG tablet Take 1-2 tablets by mouth every 6 (six) hours as needed for moderate pain. (Patient not taking: Reported on 03/12/2018)  . [DISCONTINUED] silver sulfADIAZINE (SILVADENE) 1 % cream Apply 1 application topically 2 (two) times daily. (Patient not taking: Reported on 03/12/2018)   No facility-administered encounter medications on file as of 03/12/2018.     Surgical History: Past Surgical History:  Procedure Laterality Date  . BREAST EXCISIONAL BIOPSY Left 1972   neg  . BREAST EXCISIONAL BIOPSY Right 1980   neg  . COLONOSCOPY    . ESOPHAGOGASTRODUODENOSCOPY (EGD) WITH PROPOFOL N/A 04/09/2015   Procedure: ESOPHAGOGASTRODUODENOSCOPY (EGD) WITH PROPOFOL;  Surgeon: Manya Silvas, MD;  Location: [facility] ENDOSCOPY;  Service: Endoscopy;  Laterality: N/A;  . skin cancer removal      Medical History: Past  Medical History:  Diagnosis Date  . Anemia   . Cancer (HCC)    squamous cell  . Glaucoma   . Hyperlipidemia   . Hypertension   . Osteoporosis     Family History: Family History  Problem Relation Age of Onset  . Breast cancer Sister 62  . Atrial fibrillation Mother       Review of Systems  Constitutional: Negative for chills, fatigue and unexpected weight change.  HENT: Negative for congestion, rhinorrhea, sneezing and sore throat.   Eyes: Negative for photophobia, pain and redness.  Respiratory: Negative for cough, chest tightness and shortness of breath.   Cardiovascular: Negative for chest pain and palpitations.  Gastrointestinal: Negative for abdominal pain, constipation, diarrhea, nausea and vomiting.  Endocrine: Negative.   Genitourinary: Negative for dysuria and frequency.  Musculoskeletal: Negative for arthralgias, back pain, joint swelling and neck pain.  Skin: Negative for rash.  Allergic/Immunologic: Negative.   Neurological: Negative for tremors and numbness.  Hematological: Negative for adenopathy. Does not bruise/bleed easily.  Psychiatric/Behavioral: Negative for behavioral problems and sleep disturbance. The patient is not nervous/anxious.      Vital Signs: BP 138/60   Pulse 63   Resp 16   Ht 5\' 2"  (1.575 m)   Wt 106 lb (48.1 kg)   SpO2 97%   BMI 19.39 kg/m    Physical Exam  Constitutional: She is oriented to person, place, and time. She appears well-developed and well-nourished. No distress.  HENT:  Head: Normocephalic and atraumatic.  Mouth/Throat: Oropharynx is clear and  moist. No oropharyngeal exudate.  Eyes: Pupils are equal, round, and reactive to light. EOM are normal.  Neck: Normal range of motion. Neck supple. No JVD present. No tracheal deviation present. No thyromegaly present.  Cardiovascular: Normal rate, regular rhythm and normal heart sounds. Exam reveals no gallop and no friction rub.  No murmur heard. Pulmonary/Chest: Effort  normal and breath sounds normal. No respiratory distress. She has no wheezes. She has no rales. She exhibits no tenderness.  Abdominal: Soft. There is no tenderness. There is no guarding.  Musculoskeletal: Normal range of motion.  Lymphadenopathy:    She has no cervical adenopathy.  Neurological: She is alert and oriented to person, place, and time. No cranial nerve deficit.  Skin: Skin is warm and dry. She is not diaphoretic.  Psychiatric: She has a normal mood and affect. Her behavior is normal. Judgment and thought content normal.  Nursing note and vitals reviewed.    LABS: No results found for this or any previous visit (from the past 2160 hour(s)).  Assessment/Plan: 1. Hypertension, unspecified type Controlled, Refill Amlodipine.     2. Encounter for general adult medical examination with abnormal findings - CBC with Differential/Platelet - Lipid Panel With LDL/HDL Ratio - TSH - T4, free - Comprehensive metabolic panel  3. Dysuria - UA/M w/rflx Culture, Routine  4. B12 deficiency Stop injections for now.  Pt will request redraw if she beings to feel symptoms again.  Otherwise, we will recheck in 1 year.   5. Malignant neoplasm of skin of lower leg, unspecified laterality Followed by Oncology and Derm. Continue current therapy.   6. Hyperlipidemia, unspecified hyperlipidemia type Refill Simvastatin.    General Counseling: Joann Warren verbalizes understanding of the findings of todays visit and agrees with plan of treatment. I have discussed any further diagnostic evaluation that may be needed or ordered today. We also reviewed her medications today. she has been encouraged to call the office with any questions or concerns that should arise related to todays visit.   Orders Placed This Encounter  Procedures  . UA/M w/rflx Culture, Routine    No orders of the defined types were placed in this encounter.   Time spent: 25 Minutes   This patient was seen by Orson Gear  AGNP-C in Collaboration with Dr Lavera Guise as a part of collaborative care agreement   Lavera Guise, MD  Internal Medicine

## 2018-03-12 NOTE — Patient Instructions (Signed)
Cyanocobalamin, Vitamin B12 injection What is this medicine? CYANOCOBALAMIN (sye an oh koe BAL a min) is a man made form of vitamin B12. Vitamin B12 is used in the growth of healthy blood cells, nerve cells, and proteins in the body. It also helps with the metabolism of fats and carbohydrates. This medicine is used to treat people who can not absorb vitamin B12. This medicine may be used for other purposes; ask your health care provider or pharmacist if you have questions. COMMON BRAND NAME(S): B-12 Compliance Kit, B-12 Injection Kit, Cyomin, LA-12, Nutri-Twelve, Physicians EZ Use B-12, Primabalt What should I tell my health care provider before I take this medicine? They need to know if you have any of these conditions: -kidney disease -Leber's disease -megaloblastic anemia -an unusual or allergic reaction to cyanocobalamin, cobalt, other medicines, foods, dyes, or preservatives -pregnant or trying to get pregnant -breast-feeding How should I use this medicine? This medicine is injected into a muscle or deeply under the skin. It is usually given by a health care professional in a clinic or doctor's office. However, your doctor may teach you how to inject yourself. Follow all instructions. Talk to your pediatrician regarding the use of this medicine in children. Special care may be needed. Overdosage: If you think you have taken too much of this medicine contact a poison control center or emergency room at once. NOTE: This medicine is only for you. Do not share this medicine with others. What if I miss a dose? If you are given your dose at a clinic or doctor's office, call to reschedule your appointment. If you give your own injections and you miss a dose, take it as soon as you can. If it is almost time for your next dose, take only that dose. Do not take double or extra doses. What may interact with this medicine? -colchicine -heavy alcohol intake This list may not describe all possible  interactions. Give your health care provider a list of all the medicines, herbs, non-prescription drugs, or dietary supplements you use. Also tell them if you smoke, drink alcohol, or use illegal drugs. Some items may interact with your medicine. What should I watch for while using this medicine? Visit your doctor or health care professional regularly. You may need blood work done while you are taking this medicine. You may need to follow a special diet. Talk to your doctor. Limit your alcohol intake and avoid smoking to get the best benefit. What side effects may I notice from receiving this medicine? Side effects that you should report to your doctor or health care professional as soon as possible: -allergic reactions like skin rash, itching or hives, swelling of the face, lips, or tongue -blue tint to skin -chest tightness, pain -difficulty breathing, wheezing -dizziness -red, swollen painful area on the leg Side effects that usually do not require medical attention (report to your doctor or health care professional if they continue or are bothersome): -diarrhea -headache This list may not describe all possible side effects. Call your doctor for medical advice about side effects. You may report side effects to FDA at 1-800-FDA-1088. Where should I keep my medicine? Keep out of the reach of children. Store at room temperature between 15 and 30 degrees C (59 and 85 degrees F). Protect from light. Throw away any unused medicine after the expiration date. NOTE: This sheet is a summary. It may not cover all possible information. If you have questions about this medicine, talk to your doctor, pharmacist, or   health care provider.  2018 Elsevier/Gold Standard (2007-11-15 22:10:20)  

## 2018-03-13 LAB — MICROSCOPIC EXAMINATION
Bacteria, UA: NONE SEEN
Casts: NONE SEEN /lpf
RBC, UA: NONE SEEN /hpf (ref 0–2)
WBC, UA: NONE SEEN /hpf (ref 0–5)

## 2018-03-13 LAB — UA/M W/RFLX CULTURE, ROUTINE
Bilirubin, UA: NEGATIVE
Glucose, UA: NEGATIVE
Ketones, UA: NEGATIVE
Leukocytes, UA: NEGATIVE
Nitrite, UA: NEGATIVE
Protein, UA: NEGATIVE
RBC, UA: NEGATIVE
Specific Gravity, UA: <=1.005 — AB (ref 1.005–1.030)
Urobilinogen, Ur: 0.2 mg/dL (ref 0.2–1.0)
pH, UA: 6 (ref 5.0–7.5)

## 2018-03-15 ENCOUNTER — Ambulatory Visit
Admission: RE | Admit: 2018-03-15 | Discharge: 2018-03-15 | Disposition: A | Discharge status: Home / Self Care | Payer: PPO | Source: Ambulatory Visit | Attending: Radiation Oncology | Admitting: Radiation Oncology

## 2018-03-15 DIAGNOSIS — Z510 Encounter for antineoplastic radiation therapy: Secondary | ICD-10-CM | POA: Diagnosis not present

## 2018-03-16 ENCOUNTER — Ambulatory Visit
Admission: RE | Admit: 2018-03-16 | Discharge: 2018-03-16 | Disposition: A | Discharge status: Home / Self Care | Payer: PPO | Source: Ambulatory Visit | Attending: Radiation Oncology | Admitting: Radiation Oncology

## 2018-03-16 DIAGNOSIS — C44722 Squamous cell carcinoma of skin of right lower limb, including hip: Secondary | ICD-10-CM | POA: Diagnosis not present

## 2018-03-16 DIAGNOSIS — Z510 Encounter for antineoplastic radiation therapy: Secondary | ICD-10-CM | POA: Diagnosis not present

## 2018-03-16 DIAGNOSIS — C44729 Squamous cell carcinoma of skin of left lower limb, including hip: Secondary | ICD-10-CM | POA: Diagnosis not present

## 2018-03-17 ENCOUNTER — Ambulatory Visit
Admission: RE | Admit: 2018-03-17 | Discharge: 2018-03-17 | Disposition: A | Discharge status: Home / Self Care | Payer: PPO | Source: Ambulatory Visit | Attending: Radiation Oncology | Admitting: Radiation Oncology

## 2018-03-17 DIAGNOSIS — Z510 Encounter for antineoplastic radiation therapy: Secondary | ICD-10-CM | POA: Diagnosis not present

## 2018-03-18 ENCOUNTER — Ambulatory Visit
Admission: RE | Admit: 2018-03-18 | Discharge: 2018-03-18 | Disposition: A | Discharge status: Home / Self Care | Payer: PPO | Source: Ambulatory Visit | Attending: Radiation Oncology | Admitting: Radiation Oncology

## 2018-03-18 DIAGNOSIS — C44722 Squamous cell carcinoma of skin of right lower limb, including hip: Secondary | ICD-10-CM | POA: Diagnosis not present

## 2018-03-18 DIAGNOSIS — Z510 Encounter for antineoplastic radiation therapy: Secondary | ICD-10-CM | POA: Diagnosis not present

## 2018-03-19 ENCOUNTER — Ambulatory Visit
Admission: RE | Admit: 2018-03-19 | Discharge: 2018-03-19 | Disposition: A | Discharge status: Home / Self Care | Payer: PPO | Source: Ambulatory Visit | Attending: Radiation Oncology | Admitting: Radiation Oncology

## 2018-03-19 DIAGNOSIS — Z510 Encounter for antineoplastic radiation therapy: Secondary | ICD-10-CM | POA: Diagnosis not present

## 2018-03-22 ENCOUNTER — Encounter: Payer: Self-pay | Admitting: Adult Health

## 2018-03-22 ENCOUNTER — Ambulatory Visit
Admission: RE | Admit: 2018-03-22 | Discharge: 2018-03-22 | Disposition: A | Discharge status: Home / Self Care | Payer: PPO | Source: Ambulatory Visit | Attending: Radiation Oncology | Admitting: Radiation Oncology

## 2018-03-22 DIAGNOSIS — Z510 Encounter for antineoplastic radiation therapy: Secondary | ICD-10-CM | POA: Diagnosis not present

## 2018-03-23 ENCOUNTER — Ambulatory Visit
Admission: RE | Admit: 2018-03-23 | Discharge: 2018-03-23 | Disposition: A | Discharge status: Home / Self Care | Payer: PPO | Source: Ambulatory Visit | Attending: Radiation Oncology | Admitting: Radiation Oncology

## 2018-03-23 DIAGNOSIS — C44729 Squamous cell carcinoma of skin of left lower limb, including hip: Secondary | ICD-10-CM | POA: Diagnosis not present

## 2018-03-23 DIAGNOSIS — C44722 Squamous cell carcinoma of skin of right lower limb, including hip: Secondary | ICD-10-CM | POA: Diagnosis not present

## 2018-03-23 DIAGNOSIS — Z510 Encounter for antineoplastic radiation therapy: Secondary | ICD-10-CM | POA: Diagnosis not present

## 2018-03-24 ENCOUNTER — Ambulatory Visit
Admission: RE | Admit: 2018-03-24 | Discharge: 2018-03-24 | Disposition: A | Discharge status: Home / Self Care | Payer: PPO | Source: Ambulatory Visit | Attending: Radiation Oncology | Admitting: Radiation Oncology

## 2018-03-24 DIAGNOSIS — Z510 Encounter for antineoplastic radiation therapy: Secondary | ICD-10-CM | POA: Diagnosis not present

## 2018-03-25 ENCOUNTER — Ambulatory Visit: Payer: PPO

## 2018-03-25 ENCOUNTER — Ambulatory Visit
Admission: RE | Admit: 2018-03-25 | Discharge: 2018-03-25 | Disposition: A | Discharge status: Home / Self Care | Payer: PPO | Source: Ambulatory Visit | Attending: Radiation Oncology | Admitting: Radiation Oncology

## 2018-03-25 DIAGNOSIS — Z510 Encounter for antineoplastic radiation therapy: Secondary | ICD-10-CM | POA: Diagnosis not present

## 2018-03-26 ENCOUNTER — Ambulatory Visit
Admission: RE | Admit: 2018-03-26 | Discharge: 2018-03-26 | Disposition: A | Discharge status: Home / Self Care | Payer: PPO | Source: Ambulatory Visit | Attending: Radiation Oncology | Admitting: Radiation Oncology

## 2018-03-26 DIAGNOSIS — Z510 Encounter for antineoplastic radiation therapy: Secondary | ICD-10-CM | POA: Diagnosis not present

## 2018-03-29 ENCOUNTER — Ambulatory Visit
Admission: RE | Admit: 2018-03-29 | Discharge: 2018-03-29 | Disposition: A | Discharge status: Home / Self Care | Payer: PPO | Source: Ambulatory Visit | Attending: Radiation Oncology | Admitting: Radiation Oncology

## 2018-03-29 DIAGNOSIS — Z510 Encounter for antineoplastic radiation therapy: Secondary | ICD-10-CM | POA: Diagnosis not present

## 2018-03-30 ENCOUNTER — Ambulatory Visit
Admission: RE | Admit: 2018-03-30 | Discharge: 2018-03-30 | Disposition: A | Discharge status: Home / Self Care | Payer: PPO | Source: Ambulatory Visit | Attending: Radiation Oncology | Admitting: Radiation Oncology

## 2018-03-30 ENCOUNTER — Encounter: Payer: Self-pay | Admitting: *Deleted

## 2018-03-30 DIAGNOSIS — C44722 Squamous cell carcinoma of skin of right lower limb, including hip: Secondary | ICD-10-CM | POA: Diagnosis not present

## 2018-03-30 DIAGNOSIS — Z510 Encounter for antineoplastic radiation therapy: Secondary | ICD-10-CM | POA: Diagnosis not present

## 2018-03-30 DIAGNOSIS — C44729 Squamous cell carcinoma of skin of left lower limb, including hip: Secondary | ICD-10-CM | POA: Diagnosis not present

## 2018-03-31 ENCOUNTER — Ambulatory Visit
Admission: RE | Admit: 2018-03-31 | Discharge: 2018-03-31 | Disposition: A | Discharge status: Home / Self Care | Payer: PPO | Source: Ambulatory Visit | Attending: Radiation Oncology | Admitting: Radiation Oncology

## 2018-03-31 DIAGNOSIS — Z510 Encounter for antineoplastic radiation therapy: Secondary | ICD-10-CM | POA: Diagnosis not present

## 2018-04-01 ENCOUNTER — Ambulatory Visit
Admission: RE | Admit: 2018-04-01 | Discharge: 2018-04-01 | Disposition: A | Discharge status: Home / Self Care | Payer: PPO | Source: Ambulatory Visit | Attending: Radiation Oncology | Admitting: Radiation Oncology

## 2018-04-01 DIAGNOSIS — Z510 Encounter for antineoplastic radiation therapy: Secondary | ICD-10-CM | POA: Diagnosis not present

## 2018-04-02 ENCOUNTER — Ambulatory Visit
Admission: RE | Admit: 2018-04-02 | Discharge: 2018-04-02 | Disposition: A | Discharge status: Home / Self Care | Payer: PPO | Source: Ambulatory Visit | Attending: Radiation Oncology | Admitting: Radiation Oncology

## 2018-04-02 DIAGNOSIS — Z510 Encounter for antineoplastic radiation therapy: Secondary | ICD-10-CM | POA: Diagnosis not present

## 2018-04-04 ENCOUNTER — Ambulatory Visit: Admission: RE | Admit: 2018-04-04 | Payer: PPO | Source: Ambulatory Visit

## 2018-04-05 ENCOUNTER — Ambulatory Visit: Payer: PPO

## 2018-04-05 ENCOUNTER — Ambulatory Visit
Admission: RE | Admit: 2018-04-05 | Discharge: 2018-04-05 | Disposition: A | Discharge status: Home / Self Care | Payer: PPO | Source: Ambulatory Visit | Attending: Radiation Oncology | Admitting: Radiation Oncology

## 2018-04-05 DIAGNOSIS — Z510 Encounter for antineoplastic radiation therapy: Secondary | ICD-10-CM | POA: Diagnosis not present

## 2018-04-05 DIAGNOSIS — Z85828 Personal history of other malignant neoplasm of skin: Secondary | ICD-10-CM | POA: Diagnosis not present

## 2018-04-05 DIAGNOSIS — C44622 Squamous cell carcinoma of skin of right upper limb, including shoulder: Secondary | ICD-10-CM | POA: Diagnosis not present

## 2018-04-05 DIAGNOSIS — L570 Actinic keratosis: Secondary | ICD-10-CM | POA: Diagnosis not present

## 2018-04-05 DIAGNOSIS — C44529 Squamous cell carcinoma of skin of other part of trunk: Secondary | ICD-10-CM | POA: Diagnosis not present

## 2018-04-05 DIAGNOSIS — D485 Neoplasm of uncertain behavior of skin: Secondary | ICD-10-CM | POA: Diagnosis not present

## 2018-04-05 DIAGNOSIS — L578 Other skin changes due to chronic exposure to nonionizing radiation: Secondary | ICD-10-CM | POA: Diagnosis not present

## 2018-04-05 DIAGNOSIS — L820 Inflamed seborrheic keratosis: Secondary | ICD-10-CM | POA: Diagnosis not present

## 2018-04-06 ENCOUNTER — Ambulatory Visit
Admission: RE | Admit: 2018-04-06 | Discharge: 2018-04-06 | Disposition: A | Discharge status: Home / Self Care | Payer: PPO | Source: Ambulatory Visit | Attending: Radiation Oncology | Admitting: Radiation Oncology

## 2018-04-06 DIAGNOSIS — Z510 Encounter for antineoplastic radiation therapy: Secondary | ICD-10-CM | POA: Diagnosis not present

## 2018-04-06 DIAGNOSIS — C44729 Squamous cell carcinoma of skin of left lower limb, including hip: Secondary | ICD-10-CM | POA: Diagnosis not present

## 2018-04-06 DIAGNOSIS — C44722 Squamous cell carcinoma of skin of right lower limb, including hip: Secondary | ICD-10-CM | POA: Diagnosis not present

## 2018-04-07 ENCOUNTER — Ambulatory Visit
Admission: RE | Admit: 2018-04-07 | Discharge: 2018-04-07 | Disposition: A | Discharge status: Home / Self Care | Payer: PPO | Source: Ambulatory Visit | Attending: Radiation Oncology | Admitting: Radiation Oncology

## 2018-04-07 DIAGNOSIS — Z510 Encounter for antineoplastic radiation therapy: Secondary | ICD-10-CM | POA: Diagnosis not present

## 2018-04-08 ENCOUNTER — Ambulatory Visit
Admission: RE | Admit: 2018-04-08 | Discharge: 2018-04-08 | Disposition: A | Discharge status: Home / Self Care | Payer: PPO | Source: Ambulatory Visit | Attending: Radiation Oncology | Admitting: Radiation Oncology

## 2018-04-08 DIAGNOSIS — Z510 Encounter for antineoplastic radiation therapy: Secondary | ICD-10-CM | POA: Diagnosis not present

## 2018-04-09 ENCOUNTER — Ambulatory Visit
Admission: RE | Admit: 2018-04-09 | Discharge: 2018-04-09 | Disposition: A | Discharge status: Home / Self Care | Payer: PPO | Source: Ambulatory Visit | Attending: Radiation Oncology | Admitting: Radiation Oncology

## 2018-04-09 DIAGNOSIS — Z510 Encounter for antineoplastic radiation therapy: Secondary | ICD-10-CM | POA: Diagnosis not present

## 2018-04-12 ENCOUNTER — Ambulatory Visit
Admission: RE | Admit: 2018-04-12 | Discharge: 2018-04-12 | Disposition: A | Discharge status: Home / Self Care | Payer: PPO | Source: Ambulatory Visit | Attending: Radiation Oncology | Admitting: Radiation Oncology

## 2018-04-12 DIAGNOSIS — Z510 Encounter for antineoplastic radiation therapy: Secondary | ICD-10-CM | POA: Diagnosis not present

## 2018-04-13 ENCOUNTER — Ambulatory Visit
Admission: RE | Admit: 2018-04-13 | Discharge: 2018-04-13 | Disposition: A | Discharge status: Home / Self Care | Payer: PPO | Source: Ambulatory Visit | Attending: Radiation Oncology | Admitting: Radiation Oncology

## 2018-04-13 DIAGNOSIS — Z510 Encounter for antineoplastic radiation therapy: Secondary | ICD-10-CM | POA: Diagnosis not present

## 2018-04-13 DIAGNOSIS — C44729 Squamous cell carcinoma of skin of left lower limb, including hip: Secondary | ICD-10-CM | POA: Diagnosis not present

## 2018-04-13 DIAGNOSIS — C44722 Squamous cell carcinoma of skin of right lower limb, including hip: Secondary | ICD-10-CM | POA: Diagnosis not present

## 2018-04-16 MED ORDER — CYANOCOBALAMIN 1000 MCG/ML IJ SOLN
1000.0000 ug | Freq: Once | INTRAMUSCULAR | Status: AC
  Administered 2017-12-01: 1000 ug via INTRAMUSCULAR

## 2018-04-21 ENCOUNTER — Other Ambulatory Visit: Payer: Self-pay | Admitting: *Deleted

## 2018-04-21 MED ORDER — IBUPROFEN 600 MG PO TABS: 600.0000 mg | ORAL_TABLET | Freq: Four times a day (QID) | ORAL | 2 refills | Status: DC | PRN

## 2018-04-27 ENCOUNTER — Other Ambulatory Visit: Payer: Self-pay | Admitting: Internal Medicine

## 2018-04-27 DIAGNOSIS — Z0001 Encounter for general adult medical examination with abnormal findings: Secondary | ICD-10-CM | POA: Diagnosis not present

## 2018-04-28 LAB — COMPREHENSIVE METABOLIC PANEL
ALT: 7 IU/L (ref 0–32)
AST: 13 IU/L (ref 0–40)
Albumin/Globulin Ratio: 1.2 (ref 1.2–2.2)
Albumin: 4 g/dL (ref 3.5–4.8)
Alkaline Phosphatase: 82 IU/L (ref 39–117)
BUN/Creatinine Ratio: 16 (ref 12–28)
BUN: 15 mg/dL (ref 8–27)
Bilirubin Total: 0.4 mg/dL (ref 0.0–1.2)
CO2: 27 mmol/L (ref 20–29)
Calcium: 9.5 mg/dL (ref 8.7–10.3)
Chloride: 105 mmol/L (ref 96–106)
Creatinine, Ser: 0.93 mg/dL (ref 0.57–1.00)
GFR calc Af Amer: 71 mL/min/{1.73_m2} (ref >59)
GFR calc non Af Amer: 62 mL/min/{1.73_m2} (ref >59)
Globulin, Total: 3.3 g/dL (ref 1.5–4.5)
Glucose: 79 mg/dL (ref 65–99)
Potassium: 4.2 mmol/L (ref 3.5–5.2)
Sodium: 145 mmol/L — ABNORMAL HIGH (ref 134–144)
Total Protein: 7.3 g/dL (ref 6.0–8.5)

## 2018-04-28 LAB — CBC WITH DIFFERENTIAL/PLATELET
Basophils Absolute: 0.1 10*3/uL (ref 0.0–0.2)
Basos: 1 %
EOS (ABSOLUTE): 0.2 10*3/uL (ref 0.0–0.4)
Eos: 3 %
Hematocrit: 34.4 % (ref 34.0–46.6)
Hemoglobin: 11 g/dL — ABNORMAL LOW (ref 11.1–15.9)
Immature Grans (Abs): 0 10*3/uL (ref 0.0–0.1)
Immature Granulocytes: 0 %
Lymphocytes Absolute: 2 10*3/uL (ref 0.7–3.1)
Lymphs: 28 %
MCH: 27.1 pg (ref 26.6–33.0)
MCHC: 32 g/dL (ref 31.5–35.7)
MCV: 85 fL (ref 79–97)
Monocytes Absolute: 0.4 10*3/uL (ref 0.1–0.9)
Monocytes: 6 %
Neutrophils Absolute: 4.5 10*3/uL (ref 1.4–7.0)
Neutrophils: 62 %
Platelets: 355 10*3/uL (ref 150–450)
RBC: 4.06 x10E6/uL (ref 3.77–5.28)
RDW: 13.6 % (ref 12.3–15.4)
WBC: 7.2 10*3/uL (ref 3.4–10.8)

## 2018-04-28 LAB — T4, FREE: Free T4: 1.14 ng/dL (ref 0.82–1.77)

## 2018-04-28 LAB — LIPID PANEL WITH LDL/HDL RATIO
Cholesterol, Total: 168 mg/dL (ref 100–199)
HDL: 75 mg/dL (ref >39)
LDL Calculated: 82 mg/dL (ref 0–99)
LDl/HDL Ratio: 1.1 ratio (ref 0.0–3.2)
Triglycerides: 57 mg/dL (ref 0–149)
VLDL Cholesterol Cal: 11 mg/dL (ref 5–40)

## 2018-04-28 LAB — TSH: TSH: 3.23 u[IU]/mL (ref 0.450–4.500)

## 2018-05-19 ENCOUNTER — Encounter: Payer: Self-pay | Admitting: Radiation Oncology

## 2018-05-19 ENCOUNTER — Other Ambulatory Visit: Payer: Self-pay

## 2018-05-19 ENCOUNTER — Ambulatory Visit
Admission: RE | Admit: 2018-05-19 | Discharge: 2018-05-19 | Disposition: A | Discharge status: Home / Self Care | Payer: PPO | Source: Ambulatory Visit | Attending: Radiation Oncology | Admitting: Radiation Oncology

## 2018-05-19 VITALS — BP 129/77 | HR 64 | Temp 96.9°F | Resp 18 | Wt 106.7 lb

## 2018-05-19 DIAGNOSIS — C44721 Squamous cell carcinoma of skin of unspecified lower limb, including hip: Secondary | ICD-10-CM | POA: Insufficient documentation

## 2018-05-19 DIAGNOSIS — Z923 Personal history of irradiation: Secondary | ICD-10-CM | POA: Diagnosis not present

## 2018-05-19 NOTE — Progress Notes (Signed)
Radiation Oncology Follow up Note  Name: Joann Warren   Date:   05/19/2018 MRN:  [id number] DOB: 1946/01/03    This 72 y.o. female presents to the clinic today for one-month follow-up status post completion of electron beam radiation therapy to her lower extremities.Marland Kitchen  REFERRING PROVIDER: Lavera Guise, MD  HPI: patient is a 72 year old female who is undergone bilateral anterior posterior electron beam therapy to her lower extremities for widespread squamous cell carcinomas. She is seen today in routine follow-up is doing well. Skin is completely healed. I see no evidence of open ulcerations anywhere. She's having no pain or swelling in the lower extremities..no lower extremity edema is noted.Well-developed well-nourished patient in NAD. HEENT reveals PERLA, EOMI, discs not visualized.  Oral cavity is clear. No oral mucosal lesions are identified. Neck is clear without evidence of cervical or supraclavicular adenopathy. Lungs are clear to A&P. Cardiac examination is essentially unremarkable with regular rate and rhythm without murmur rub or thrill. Abdomen is benign with no organomegaly or masses noted. Motor sensory and DTR levels are equal and symmetric in the upper and lower extremities. Cranial nerves II through XII are grossly intact. Proprioception is intact. No peripheral adenopathy or edema is identified. No motor or sensory levels are noted. Crude visual fields are within normal range.  COMPLICATIONS OF TREATMENT: none  FOLLOW UP COMPLIANCE: keeps appointments   PHYSICAL EXAM:  BP 129/77 (BP Location: Left Arm)   Pulse 64   Temp (!) 96.9 F (36.1 C) (Tympanic)   Resp 18   Wt 106 lb 11.2 oz (48.4 kg)   BMI 19.52 kg/m  Lower extremities show multiple nodular areas all consistent with healing no active ulcerations of the lower extremities are noted. No edemaWell-developed well-nourished patient in NAD. HEENT reveals PERLA, EOMI, discs not visualized.  Oral cavity is clear. No oral  mucosal lesions are identified. Neck is clear without evidence of cervical or supraclavicular adenopathy. Lungs are clear to A&P. Cardiac examination is essentially unremarkable with regular rate and rhythm without murmur rub or thrill. Abdomen is benign with no organomegaly or masses noted. Motor sensory and DTR levels are equal and symmetric in the upper and lower extremities. Cranial nerves II through XII are grossly intact. Proprioception is intact. No peripheral adenopathy or edema is identified. No motor or sensory levels are noted. Crude visual fields are within normal range.  RADIOLOGY RESULTS: no current films for review  PLAN: present time patient is doing well I see no areas that need attention at this time. I'll see her back in 3-4 months for follow-up. Patient is to alert [place] of any areas of ulceration develop or any areas of progressive skin lesions noted. She continues close follow-up care with dermatology.  I would like to take this opportunity to thank you for allowing me to participate in the care of your patient.Noreene Filbert, MD

## 2018-06-03 DIAGNOSIS — D485 Neoplasm of uncertain behavior of skin: Secondary | ICD-10-CM | POA: Diagnosis not present

## 2018-06-03 DIAGNOSIS — L821 Other seborrheic keratosis: Secondary | ICD-10-CM | POA: Diagnosis not present

## 2018-06-03 DIAGNOSIS — B078 Other viral warts: Secondary | ICD-10-CM | POA: Diagnosis not present

## 2018-06-03 DIAGNOSIS — L578 Other skin changes due to chronic exposure to nonionizing radiation: Secondary | ICD-10-CM | POA: Diagnosis not present

## 2018-06-03 DIAGNOSIS — C44729 Squamous cell carcinoma of skin of left lower limb, including hip: Secondary | ICD-10-CM | POA: Diagnosis not present

## 2018-06-03 DIAGNOSIS — Z85828 Personal history of other malignant neoplasm of skin: Secondary | ICD-10-CM | POA: Diagnosis not present

## 2018-06-24 DIAGNOSIS — Z85828 Personal history of other malignant neoplasm of skin: Secondary | ICD-10-CM | POA: Diagnosis not present

## 2018-06-24 DIAGNOSIS — L820 Inflamed seborrheic keratosis: Secondary | ICD-10-CM | POA: Diagnosis not present

## 2018-06-24 DIAGNOSIS — B078 Other viral warts: Secondary | ICD-10-CM | POA: Diagnosis not present

## 2018-06-24 DIAGNOSIS — L578 Other skin changes due to chronic exposure to nonionizing radiation: Secondary | ICD-10-CM | POA: Diagnosis not present

## 2018-06-24 DIAGNOSIS — D485 Neoplasm of uncertain behavior of skin: Secondary | ICD-10-CM | POA: Diagnosis not present

## 2018-07-28 DIAGNOSIS — H401112 Primary open-angle glaucoma, right eye, moderate stage: Secondary | ICD-10-CM | POA: Diagnosis not present

## 2018-08-02 DIAGNOSIS — H401112 Primary open-angle glaucoma, right eye, moderate stage: Secondary | ICD-10-CM | POA: Diagnosis not present

## 2018-08-26 ENCOUNTER — Other Ambulatory Visit: Payer: Self-pay

## 2018-08-26 ENCOUNTER — Encounter: Payer: Self-pay | Admitting: Radiation Oncology

## 2018-08-26 ENCOUNTER — Ambulatory Visit
Admission: RE | Admit: 2018-08-26 | Discharge: 2018-08-26 | Disposition: A | Discharge status: Home / Self Care | Payer: PPO | Source: Ambulatory Visit | Attending: Radiation Oncology | Admitting: Radiation Oncology

## 2018-08-26 VITALS — BP 140/77 | HR 75 | Temp 97.8°F | Resp 18 | Wt 107.8 lb

## 2018-08-26 DIAGNOSIS — C44721 Squamous cell carcinoma of skin of unspecified lower limb, including hip: Secondary | ICD-10-CM

## 2018-08-26 DIAGNOSIS — Z923 Personal history of irradiation: Secondary | ICD-10-CM | POA: Insufficient documentation

## 2018-08-26 NOTE — Progress Notes (Signed)
Radiation Oncology Follow up Note  Name: Joann Warren   Date:   08/26/2018 MRN:  [id number] DOB: 12/18/1945    This 73 y.o. female presents to the clinic today for for month follow-up status post bilateral lower extremity electron beam therapy for widespread squamous cell carcinomas of the lower extremities  REFERRING PROVIDER: Lavera Guise, MD  HPI: patient is a 73 year old female now out 4 months having completed bilateral anterior and posterior electron beam therapy for widespread squamous cell carcinomas of lower extremity she seen today in routine follow up is doing well. She specifically denies any new lesions any areas of ulceration or any lower extremity edema.  COMPLICATIONS OF TREATMENT: none  FOLLOW UP COMPLIANCE: keeps appointments   PHYSICAL EXAM:  BP 140/77   Pulse 75   Temp 97.8 F (36.6 C)   Resp 18   Wt 107 lb 12.9 oz (48.9 kg)   BMI 19.72 kg/m  Bilateral lower extremities show some mild erythematous changes of the skin no evidence of clinically detected [place] cell carcinomas are noted. She has no lower extremity edema.Well-developed well-nourished patient in NAD. HEENT reveals PERLA, EOMI, discs not visualized.  Oral cavity is clear. No oral mucosal lesions are identified. Neck is clear without evidence of cervical or supraclavicular adenopathy. Lungs are clear to A&P. Cardiac examination is essentially unremarkable with regular rate and rhythm without murmur rub or thrill. Abdomen is benign with no organomegaly or masses noted. Motor sensory and DTR levels are equal and symmetric in the upper and lower extremities. Cranial nerves II through XII are grossly intact. Proprioception is intact. No peripheral adenopathy or edema is identified. No motor or sensory levels are noted. Crude visual fields are within normal range.  RADIOLOGY RESULTS: no current films for review  PLAN: present time she's had excellent response to electron beam therapy I see no active lesions  at this time. I've asked to see her back in 6 months for follow-up. She continues close follow-up care with her dermatologist I've asked her to contact me should she have any other lesions that need my attention. Patient comprehend my treatment plan and recommendations well.  I would like to take this opportunity to thank you for allowing me to participate in the care of your patient.Noreene Filbert, MD

## 2018-09-01 ENCOUNTER — Other Ambulatory Visit: Payer: Self-pay

## 2018-09-01 DIAGNOSIS — Z85828 Personal history of other malignant neoplasm of skin: Secondary | ICD-10-CM | POA: Diagnosis not present

## 2018-09-01 DIAGNOSIS — C44722 Squamous cell carcinoma of skin of right lower limb, including hip: Secondary | ICD-10-CM | POA: Diagnosis not present

## 2018-09-01 DIAGNOSIS — L570 Actinic keratosis: Secondary | ICD-10-CM | POA: Diagnosis not present

## 2018-09-01 DIAGNOSIS — D485 Neoplasm of uncertain behavior of skin: Secondary | ICD-10-CM | POA: Diagnosis not present

## 2018-09-01 DIAGNOSIS — D0470 Carcinoma in situ of skin of unspecified lower limb, including hip: Secondary | ICD-10-CM | POA: Diagnosis not present

## 2018-09-01 DIAGNOSIS — L820 Inflamed seborrheic keratosis: Secondary | ICD-10-CM | POA: Diagnosis not present

## 2018-09-01 DIAGNOSIS — L578 Other skin changes due to chronic exposure to nonionizing radiation: Secondary | ICD-10-CM | POA: Diagnosis not present

## 2018-09-01 DIAGNOSIS — L821 Other seborrheic keratosis: Secondary | ICD-10-CM | POA: Diagnosis not present

## 2018-09-01 MED ORDER — ALENDRONATE SODIUM 70 MG PO TABS: ORAL_TABLET | ORAL | 1 refills | Status: DC

## 2018-09-21 ENCOUNTER — Other Ambulatory Visit: Payer: Self-pay | Admitting: Internal Medicine

## 2018-10-29 ENCOUNTER — Other Ambulatory Visit: Payer: Self-pay | Admitting: Adult Health

## 2018-11-01 DIAGNOSIS — D485 Neoplasm of uncertain behavior of skin: Secondary | ICD-10-CM | POA: Diagnosis not present

## 2018-11-01 DIAGNOSIS — C44729 Squamous cell carcinoma of skin of left lower limb, including hip: Secondary | ICD-10-CM | POA: Diagnosis not present

## 2018-11-01 DIAGNOSIS — Z85828 Personal history of other malignant neoplasm of skin: Secondary | ICD-10-CM | POA: Diagnosis not present

## 2018-11-01 DIAGNOSIS — C44511 Basal cell carcinoma of skin of breast: Secondary | ICD-10-CM | POA: Diagnosis not present

## 2018-11-01 DIAGNOSIS — L578 Other skin changes due to chronic exposure to nonionizing radiation: Secondary | ICD-10-CM | POA: Diagnosis not present

## 2018-12-08 DIAGNOSIS — D485 Neoplasm of uncertain behavior of skin: Secondary | ICD-10-CM | POA: Diagnosis not present

## 2018-12-08 DIAGNOSIS — L309 Dermatitis, unspecified: Secondary | ICD-10-CM | POA: Diagnosis not present

## 2018-12-08 DIAGNOSIS — Z85828 Personal history of other malignant neoplasm of skin: Secondary | ICD-10-CM | POA: Diagnosis not present

## 2018-12-08 DIAGNOSIS — L570 Actinic keratosis: Secondary | ICD-10-CM | POA: Diagnosis not present

## 2018-12-08 DIAGNOSIS — L820 Inflamed seborrheic keratosis: Secondary | ICD-10-CM | POA: Diagnosis not present

## 2018-12-08 DIAGNOSIS — L821 Other seborrheic keratosis: Secondary | ICD-10-CM | POA: Diagnosis not present

## 2018-12-08 DIAGNOSIS — C44722 Squamous cell carcinoma of skin of right lower limb, including hip: Secondary | ICD-10-CM | POA: Diagnosis not present

## 2018-12-22 DIAGNOSIS — L570 Actinic keratosis: Secondary | ICD-10-CM | POA: Diagnosis not present

## 2018-12-29 ENCOUNTER — Other Ambulatory Visit: Payer: Self-pay | Admitting: Internal Medicine

## 2018-12-29 DIAGNOSIS — Z1231 Encounter for screening mammogram for malignant neoplasm of breast: Secondary | ICD-10-CM

## 2019-01-05 DIAGNOSIS — C44729 Squamous cell carcinoma of skin of left lower limb, including hip: Secondary | ICD-10-CM | POA: Diagnosis not present

## 2019-01-05 DIAGNOSIS — D485 Neoplasm of uncertain behavior of skin: Secondary | ICD-10-CM | POA: Diagnosis not present

## 2019-01-05 DIAGNOSIS — C44622 Squamous cell carcinoma of skin of right upper limb, including shoulder: Secondary | ICD-10-CM | POA: Diagnosis not present

## 2019-01-05 DIAGNOSIS — L578 Other skin changes due to chronic exposure to nonionizing radiation: Secondary | ICD-10-CM | POA: Diagnosis not present

## 2019-01-05 DIAGNOSIS — Z85828 Personal history of other malignant neoplasm of skin: Secondary | ICD-10-CM | POA: Diagnosis not present

## 2019-01-19 DIAGNOSIS — D485 Neoplasm of uncertain behavior of skin: Secondary | ICD-10-CM | POA: Diagnosis not present

## 2019-01-19 DIAGNOSIS — B079 Viral wart, unspecified: Secondary | ICD-10-CM | POA: Diagnosis not present

## 2019-01-19 DIAGNOSIS — L578 Other skin changes due to chronic exposure to nonionizing radiation: Secondary | ICD-10-CM | POA: Diagnosis not present

## 2019-01-19 DIAGNOSIS — L570 Actinic keratosis: Secondary | ICD-10-CM | POA: Diagnosis not present

## 2019-01-19 DIAGNOSIS — C44722 Squamous cell carcinoma of skin of right lower limb, including hip: Secondary | ICD-10-CM | POA: Diagnosis not present

## 2019-01-31 DIAGNOSIS — H401112 Primary open-angle glaucoma, right eye, moderate stage: Secondary | ICD-10-CM | POA: Diagnosis not present

## 2019-02-03 DIAGNOSIS — D0472 Carcinoma in situ of skin of left lower limb, including hip: Secondary | ICD-10-CM | POA: Diagnosis not present

## 2019-02-03 DIAGNOSIS — L578 Other skin changes due to chronic exposure to nonionizing radiation: Secondary | ICD-10-CM | POA: Diagnosis not present

## 2019-02-03 DIAGNOSIS — Z85828 Personal history of other malignant neoplasm of skin: Secondary | ICD-10-CM | POA: Diagnosis not present

## 2019-02-03 DIAGNOSIS — C44729 Squamous cell carcinoma of skin of left lower limb, including hip: Secondary | ICD-10-CM | POA: Diagnosis not present

## 2019-03-15 ENCOUNTER — Ambulatory Visit: Payer: Self-pay | Admitting: Adult Health

## 2019-03-16 ENCOUNTER — Other Ambulatory Visit: Payer: Self-pay

## 2019-03-17 ENCOUNTER — Other Ambulatory Visit: Payer: Self-pay

## 2019-03-17 ENCOUNTER — Encounter: Payer: Self-pay | Admitting: Radiation Oncology

## 2019-03-17 ENCOUNTER — Ambulatory Visit
Admission: RE | Admit: 2019-03-17 | Discharge: 2019-03-17 | Disposition: A | Discharge status: Home / Self Care | Payer: PPO | Source: Ambulatory Visit | Attending: Radiation Oncology | Admitting: Radiation Oncology

## 2019-03-17 DIAGNOSIS — Z923 Personal history of irradiation: Secondary | ICD-10-CM | POA: Insufficient documentation

## 2019-03-17 DIAGNOSIS — C44721 Squamous cell carcinoma of skin of unspecified lower limb, including hip: Secondary | ICD-10-CM | POA: Diagnosis not present

## 2019-03-17 NOTE — Progress Notes (Signed)
Radiation Oncology Follow up Note  Name: Joann Warren   Date:   03/17/2019 MRN:  [id number] DOB: [birth year]    This 73 y.o. 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  REFERRING PROVIDER: Lavera Guise, MD  HPI: Patient is a 73 year old female now about 10 months having completed electron-beam therapy to both anterior and posterior surfaces of her lower extremities bilaterally.  Seen today in routine follow-up she is doing well she has no evidence of overt lesions that are active.  She is had an outstanding response to electron-beam therapy.  She does have a nodular density over her skin but no overt or ulcerating lesions are noted..  COMPLICATIONS OF TREATMENT: none  FOLLOW UP COMPLIANCE: keeps appointments   PHYSICAL EXAM:  BP (P) 130/66 (BP Location: Left Arm, Patient Position: Sitting)   Pulse (P) 62   Temp (!) (P) 96.5 F (35.8 C) (Tympanic)   Resp (P) 18   Wt (P) 107 lb 4.8 oz (48.7 kg)   BMI (P) 19.63 kg/m  Lower extremities show no evidence of lymphedema.  She has a nodular feel to her lower extremities although no significant or overt progressing lesions are noted.  No inguinal adenopathy is identified.  Well-developed well-nourished patient in NAD. HEENT reveals PERLA, EOMI, discs not visualized.  Oral cavity is clear. No oral mucosal lesions are identified. Neck is clear without evidence of cervical or supraclavicular adenopathy. Lungs are clear to A&P. Cardiac examination is essentially unremarkable with regular rate and rhythm without murmur rub or thrill. Abdomen is benign with no organomegaly or masses noted. Motor sensory and DTR levels are equal and symmetric in the upper and lower extremities. Cranial nerves II through XII are grossly intact. Proprioception is intact. No peripheral adenopathy or edema is identified. No motor or  sensory levels are noted. Crude visual fields are within normal range.  RADIOLOGY RESULTS: No current films for review  PLAN: Present time patient is doing well with excellent response to electron-beam therapy to her lower extremities.  I am pleased with her overall progress.  I have asked to see her back in 6 months for follow-up and then will start once a year follow-up visits.  Patient continues close follow-up care with dermatology.  I would like to take this opportunity to thank you for allowing me to participate in the care of your patient.Noreene Filbert, MD

## 2019-03-23 ENCOUNTER — Other Ambulatory Visit: Payer: Self-pay

## 2019-03-23 ENCOUNTER — Other Ambulatory Visit: Payer: Self-pay | Admitting: Adult Health

## 2019-03-23 ENCOUNTER — Encounter: Payer: Self-pay | Admitting: Adult Health

## 2019-03-23 ENCOUNTER — Ambulatory Visit (INDEPENDENT_AMBULATORY_CARE_PROVIDER_SITE_OTHER): Payer: PPO | Admitting: Adult Health

## 2019-03-23 VITALS — BP 127/62 | HR 63 | Resp 16 | Ht 62.0 in | Wt 107.0 lb

## 2019-03-23 DIAGNOSIS — R300 Dysuria: Secondary | ICD-10-CM | POA: Diagnosis not present

## 2019-03-23 DIAGNOSIS — E785 Hyperlipidemia, unspecified: Secondary | ICD-10-CM

## 2019-03-23 DIAGNOSIS — R5383 Other fatigue: Secondary | ICD-10-CM

## 2019-03-23 DIAGNOSIS — Z0001 Encounter for general adult medical examination with abnormal findings: Secondary | ICD-10-CM

## 2019-03-23 DIAGNOSIS — I10 Essential (primary) hypertension: Secondary | ICD-10-CM

## 2019-03-23 MED ORDER — ALENDRONATE SODIUM 70 MG PO TABS: ORAL_TABLET | ORAL | 4 refills | Status: DC

## 2019-03-23 MED ORDER — ALENDRONATE SODIUM 70 MG PO TABS: ORAL_TABLET | ORAL | 1 refills | Status: DC

## 2019-03-23 MED ORDER — PNEUMOCOCCAL 13-VAL CONJ VACC IM SUSP: 0.5000 mL | INTRAMUSCULAR | 0 refills | Status: AC

## 2019-03-23 NOTE — Progress Notes (Signed)
[facility] [place], [place] [address]  Internal MEDICINE  Office Visit Note  Patient Name: Joann Warren  [id number]  [id number]  Date of Service: 03/23/2019  Chief Complaint  Patient presents with  . Medical Management of Chronic Issues  . Annual Exam    medicare wellness visit   . Hyperlipidemia  . Hypertension     HPI Pt is here for routine health maintenance examination. She is a well appearing 73yo female. She has a history of hLD, and htn.   She currently has no complaints. Pt denies Tobacco, alcohol or illicit drug use.      Current Medication: Outpatient Encounter Medications as of 03/23/2019  Medication Sig  . amLODipine (NORVASC) 5 MG tablet TAKE 1 TABLET BY MOUTH DAILY  . bimatoprost (LUMIGAN) 0.03 % ophthalmic solution   . bisoprolol-hydrochlorothiazide (ZIAC) 2.5-6.25 MG tablet TAKE 1 TABLET BY MOUTH EVERY MORNING FORBLOOD PRESSURE  . hydrOXYzine (ATARAX/VISTARIL) 10 MG tablet Take 10 mg by mouth 3 (three) times daily as needed.  Marland Kitchen ibuprofen (ADVIL,MOTRIN) 600 MG tablet Take 1 tablet (600 mg total) by mouth every 6 (six) hours as needed.  . simvastatin (ZOCOR) 20 MG tablet TAKE 1 TABLET AT BEDTIME  . [DISCONTINUED] alendronate (FOSAMAX) 70 MG tablet TAKE 1 TABLET EVERY 7 DAYS WITH A FULL GLASS OF WATER ON AN EMPTY STOMACH DO NOT LIE DOWN FOR AT LEAST 30 MIN   No facility-administered encounter medications on file as of 03/23/2019.     Surgical History: Past Surgical History:  Procedure Laterality Date  . BREAST EXCISIONAL BIOPSY Left 1972   neg  . BREAST EXCISIONAL BIOPSY Right 1980   neg  . COLONOSCOPY    . ESOPHAGOGASTRODUODENOSCOPY (EGD) WITH PROPOFOL N/A 04/09/2015   Procedure: ESOPHAGOGASTRODUODENOSCOPY (EGD) WITH PROPOFOL;  Surgeon: Manya Silvas, MD;  Location: [facility] ENDOSCOPY;  Service: Endoscopy;  Laterality: N/A;  . skin cancer removal      Medical History: Past Medical History:  Diagnosis Date  . Anemia   .  Cancer (HCC)    squamous cell  . Glaucoma   . Hyperlipidemia   . Hypertension   . Osteoporosis     Family History: Family History  Problem Relation Age of Onset  . Breast cancer Sister 44  . Atrial fibrillation Mother       Review of Systems  Constitutional: Negative for chills, fatigue and unexpected weight change.  HENT: Negative for congestion, rhinorrhea, sneezing and sore throat.   Eyes: Negative for photophobia, pain and redness.  Respiratory: Negative for cough, chest tightness and shortness of breath.   Cardiovascular: Negative for chest pain and palpitations.  Gastrointestinal: Negative for abdominal pain, constipation, diarrhea, nausea and vomiting.  Endocrine: Negative.   Genitourinary: Negative for dysuria and frequency.  Musculoskeletal: Negative for arthralgias, back pain, joint swelling and neck pain.  Skin: Negative for rash.  Allergic/Immunologic: Negative.   Neurological: Negative for tremors and numbness.  Hematological: Negative for adenopathy. Does not bruise/bleed easily.  Psychiatric/Behavioral: Negative for behavioral problems and sleep disturbance. The patient is not nervous/anxious.      Vital Signs: BP 127/62 (BP Location: Left Arm, Patient Position: Sitting, Cuff Size: Small)   Pulse 63   Resp 16   Ht 5\' 2"  (1.575 m)   Wt 107 lb (48.5 kg)   SpO2 100%   BMI 19.57 kg/m    Physical Exam Vitals signs and nursing note reviewed. Exam conducted with a chaperone present.  Constitutional:  General: She is not in acute distress.    Appearance: She is well-developed. She is not diaphoretic.  HENT:     Head: Normocephalic and atraumatic.     Mouth/Throat:     Pharynx: No oropharyngeal exudate.  Eyes:     Pupils: Pupils are equal, round, and reactive to light.  Neck:     Musculoskeletal: Normal range of motion and neck supple.     Thyroid: No thyromegaly.     Vascular: No JVD.     Trachea: No tracheal deviation.  Cardiovascular:      Rate and Rhythm: Normal rate and regular rhythm.     Heart sounds: Normal heart sounds. No murmur. No friction rub. No gallop.   Pulmonary:     Effort: Pulmonary effort is normal. No respiratory distress.     Breath sounds: Normal breath sounds. No wheezing or rales.  Chest:     Chest wall: No tenderness.     Breasts:        Right: Normal. No swelling, bleeding, inverted nipple, mass, nipple discharge, skin change or tenderness.        Left: Normal. No swelling, bleeding, inverted nipple, mass, nipple discharge, skin change or tenderness.     Comments: Exam Chaperoned by Dorna Bloom CMA Abdominal:     Palpations: Abdomen is soft.     Tenderness: There is no abdominal tenderness. There is no guarding.  Musculoskeletal: Normal range of motion.  Lymphadenopathy:     Cervical: No cervical adenopathy.  Skin:    General: Skin is warm and dry.  Neurological:     Mental Status: She is alert and oriented to person, place, and time.     Cranial Nerves: No cranial nerve deficit.  Psychiatric:        Behavior: Behavior normal.        Thought Content: Thought content normal.        Judgment: Judgment normal.    LABS: No results found for this or any previous visit (from the past 2160 hour(s)).   Assessment/Plan: 1. Encounter for general adult medical examination with abnormal findings Up to date on PHM.  - CBC with Differential/Platelet - Lipid Panel With LDL/HDL Ratio - TSH - T4, free - Comprehensive metabolic panel  2. Hypertension, unspecified type Stable, continue present management.   3. Hyperlipidemia, unspecified hyperlipidemia type Will check lipid panel.   4. Other fatigue Will get fatigue labs, and follow up. - B12 and Folate Panel - Vitamin D 1,25 dihydroxy - Fe+TIBC+Fer  5. Dysuria - UA/M w/rflx Culture, Routine  General Counseling: Floraine verbalizes understanding of the findings of todays visit and agrees with plan of treatment. I have discussed any further  diagnostic evaluation that may be needed or ordered today. We also reviewed her medications today. she has been encouraged to call the office with any questions or concerns that should arise related to todays visit.   Orders Placed This Encounter  Procedures  . UA/M w/rflx Culture, Routine    No orders of the defined types were placed in this encounter.   Time spent: 35 Minutes   This patient was seen by Orson Gear AGNP-C in Collaboration with Dr Lavera Guise as a part of collaborative care agreement    Kendell Bane AGNP-C Internal Medicine

## 2019-03-24 LAB — UA/M W/RFLX CULTURE, ROUTINE
Bilirubin, UA: NEGATIVE
Glucose, UA: NEGATIVE
Ketones, UA: NEGATIVE
Leukocytes,UA: NEGATIVE
Nitrite, UA: NEGATIVE
Protein,UA: NEGATIVE
RBC, UA: NEGATIVE
Specific Gravity, UA: 1.008 (ref 1.005–1.030)
Urobilinogen, Ur: 0.2 mg/dL (ref 0.2–1.0)
pH, UA: 6.5 (ref 5.0–7.5)

## 2019-03-24 LAB — MICROSCOPIC EXAMINATION
Casts: NONE SEEN /lpf
WBC, UA: NONE SEEN /hpf (ref 0–5)

## 2019-03-25 DIAGNOSIS — Z0001 Encounter for general adult medical examination with abnormal findings: Secondary | ICD-10-CM | POA: Diagnosis not present

## 2019-03-25 DIAGNOSIS — R5383 Other fatigue: Secondary | ICD-10-CM | POA: Diagnosis not present

## 2019-03-29 ENCOUNTER — Ambulatory Visit
Admission: RE | Admit: 2019-03-29 | Discharge: 2019-03-29 | Disposition: A | Discharge status: Home / Self Care | Payer: PPO | Source: Ambulatory Visit | Attending: Internal Medicine | Admitting: Internal Medicine

## 2019-03-29 DIAGNOSIS — Z1231 Encounter for screening mammogram for malignant neoplasm of breast: Secondary | ICD-10-CM | POA: Insufficient documentation

## 2019-03-29 LAB — VITAMIN D 1,25 DIHYDROXY
Vitamin D 1, 25 (OH)2 Total: 38 pg/mL
Vitamin D2 1, 25 (OH)2: <10 pg/mL
Vitamin D3 1, 25 (OH)2: 38 pg/mL

## 2019-03-29 LAB — LIPID PANEL WITH LDL/HDL RATIO
Cholesterol, Total: 192 mg/dL (ref 100–199)
HDL: 83 mg/dL (ref >39)
LDL Calculated: 98 mg/dL (ref 0–99)
LDl/HDL Ratio: 1.2 ratio (ref 0.0–3.2)
Triglycerides: 54 mg/dL (ref 0–149)
VLDL Cholesterol Cal: 11 mg/dL (ref 5–40)

## 2019-03-29 LAB — CBC WITH DIFFERENTIAL/PLATELET
Basophils Absolute: 0.1 10*3/uL (ref 0.0–0.2)
Basos: 1 %
EOS (ABSOLUTE): 0.2 10*3/uL (ref 0.0–0.4)
Eos: 3 %
Hematocrit: 39 % (ref 34.0–46.6)
Hemoglobin: 12.7 g/dL (ref 11.1–15.9)
Immature Grans (Abs): 0 10*3/uL (ref 0.0–0.1)
Immature Granulocytes: 0 %
Lymphocytes Absolute: 1.6 10*3/uL (ref 0.7–3.1)
Lymphs: 23 %
MCH: 28.7 pg (ref 26.6–33.0)
MCHC: 32.6 g/dL (ref 31.5–35.7)
MCV: 88 fL (ref 79–97)
Monocytes Absolute: 0.4 10*3/uL (ref 0.1–0.9)
Monocytes: 6 %
Neutrophils Absolute: 4.8 10*3/uL (ref 1.4–7.0)
Neutrophils: 67 %
Platelets: 243 10*3/uL (ref 150–450)
RBC: 4.43 x10E6/uL (ref 3.77–5.28)
RDW: 13.3 % (ref 11.7–15.4)
WBC: 7.1 10*3/uL (ref 3.4–10.8)

## 2019-03-29 LAB — COMPREHENSIVE METABOLIC PANEL
ALT: 6 IU/L (ref 0–32)
AST: 15 IU/L (ref 0–40)
Albumin/Globulin Ratio: 2 (ref 1.2–2.2)
Albumin: 4.6 g/dL (ref 3.7–4.7)
Alkaline Phosphatase: 58 IU/L (ref 39–117)
BUN/Creatinine Ratio: 15 (ref 12–28)
BUN: 16 mg/dL (ref 8–27)
Bilirubin Total: 0.5 mg/dL (ref 0.0–1.2)
CO2: 26 mmol/L (ref 20–29)
Calcium: 9.5 mg/dL (ref 8.7–10.3)
Chloride: 100 mmol/L (ref 96–106)
Creatinine, Ser: 1.08 mg/dL — ABNORMAL HIGH (ref 0.57–1.00)
GFR calc Af Amer: 59 mL/min/{1.73_m2} — ABNORMAL LOW (ref >59)
GFR calc non Af Amer: 51 mL/min/{1.73_m2} — ABNORMAL LOW (ref >59)
Globulin, Total: 2.3 g/dL (ref 1.5–4.5)
Glucose: 104 mg/dL — ABNORMAL HIGH (ref 65–99)
Potassium: 5 mmol/L (ref 3.5–5.2)
Sodium: 141 mmol/L (ref 134–144)
Total Protein: 6.9 g/dL (ref 6.0–8.5)

## 2019-03-29 LAB — IRON,TIBC AND FERRITIN PANEL
Ferritin: 96 ng/mL (ref 15–150)
Iron Saturation: 21 % (ref 15–55)
Iron: 61 ug/dL (ref 27–139)
Total Iron Binding Capacity: 296 ug/dL (ref 250–450)
UIBC: 235 ug/dL (ref 118–369)

## 2019-03-29 LAB — T4, FREE: Free T4: 1.2 ng/dL (ref 0.82–1.77)

## 2019-03-29 LAB — B12 AND FOLATE PANEL
Folate: 5.7 ng/mL (ref >3.0)
Vitamin B-12: 314 pg/mL (ref 232–1245)

## 2019-03-29 LAB — TSH: TSH: 2.97 u[IU]/mL (ref 0.450–4.500)

## 2019-03-30 DIAGNOSIS — Z85828 Personal history of other malignant neoplasm of skin: Secondary | ICD-10-CM | POA: Diagnosis not present

## 2019-03-30 DIAGNOSIS — D0471 Carcinoma in situ of skin of right lower limb, including hip: Secondary | ICD-10-CM | POA: Diagnosis not present

## 2019-03-30 DIAGNOSIS — C44511 Basal cell carcinoma of skin of breast: Secondary | ICD-10-CM | POA: Diagnosis not present

## 2019-03-30 DIAGNOSIS — L578 Other skin changes due to chronic exposure to nonionizing radiation: Secondary | ICD-10-CM | POA: Diagnosis not present

## 2019-03-30 DIAGNOSIS — C44722 Squamous cell carcinoma of skin of right lower limb, including hip: Secondary | ICD-10-CM | POA: Diagnosis not present

## 2019-04-13 ENCOUNTER — Ambulatory Visit (INDEPENDENT_AMBULATORY_CARE_PROVIDER_SITE_OTHER): Payer: PPO | Admitting: Adult Health

## 2019-04-13 ENCOUNTER — Encounter: Payer: Self-pay | Admitting: Adult Health

## 2019-04-13 VITALS — BP 138/61 | HR 66 | Resp 16 | Ht 62.0 in | Wt 107.4 lb

## 2019-04-13 DIAGNOSIS — R7989 Other specified abnormal findings of blood chemistry: Secondary | ICD-10-CM | POA: Diagnosis not present

## 2019-04-13 DIAGNOSIS — E785 Hyperlipidemia, unspecified: Secondary | ICD-10-CM

## 2019-04-13 DIAGNOSIS — I10 Essential (primary) hypertension: Secondary | ICD-10-CM | POA: Diagnosis not present

## 2019-04-13 NOTE — Progress Notes (Signed)
[facility] [place], [place] [address]  Internal MEDICINE  Office Visit Note  Patient Name: Joann Warren  [id number]  [id number]  Date of Service: 04/13/2019  Chief Complaint  Patient presents with  . Medical Management of Chronic Issues    3 week follow up review labs     HPI  Patient is here for follow-up on lab results from physical 3 weeks ago.  Overall her labs look good however it is noted that her creatinine is slightly elevated at 1.08 her BUN is normal at 16.  It is of note that her GFR is slightly decreased at 51.   Current Medication: Outpatient Encounter Medications as of 04/13/2019  Medication Sig  . alendronate (FOSAMAX) 70 MG tablet TAKE 1 TABLET EVERY 7 DAYS WITH A FULL GLASS OF WATER ON AN EMPTY STOMACH DO NOT LIE DOWN FOR AT LEAST 30 MIN  . amLODipine (NORVASC) 5 MG tablet TAKE 1 TABLET BY MOUTH DAILY  . bimatoprost (LUMIGAN) 0.03 % ophthalmic solution   . bisoprolol-hydrochlorothiazide (ZIAC) 2.5-6.25 MG tablet TAKE 1 TABLET BY MOUTH EVERY MORNING FORBLOOD PRESSURE  . hydrOXYzine (ATARAX/VISTARIL) 10 MG tablet Take 10 mg by mouth 3 (three) times daily as needed.  Marland Kitchen ibuprofen (ADVIL,MOTRIN) 600 MG tablet Take 1 tablet (600 mg total) by mouth every 6 (six) hours as needed.  . simvastatin (ZOCOR) 20 MG tablet TAKE 1 TABLET AT BEDTIME   No facility-administered encounter medications on file as of 04/13/2019.     Surgical History: Past Surgical History:  Procedure Laterality Date  . BREAST EXCISIONAL BIOPSY Left 1972   neg  . BREAST EXCISIONAL BIOPSY Right 1980   neg  . COLONOSCOPY    . ESOPHAGOGASTRODUODENOSCOPY (EGD) WITH PROPOFOL N/A 04/09/2015   Procedure: ESOPHAGOGASTRODUODENOSCOPY (EGD) WITH PROPOFOL;  Surgeon: Manya Silvas, MD;  Location: [facility] ENDOSCOPY;  Service: Endoscopy;  Laterality: N/A;  . skin cancer removal      Medical History: Past Medical History:  Diagnosis Date  . Anemia   . Cancer (HCC)    squamous  cell  . Glaucoma   . Hyperlipidemia   . Hypertension   . Osteoporosis     Family History: Family History  Problem Relation Age of Onset  . Breast cancer Sister 46  . Atrial fibrillation Mother     Social History   Socioeconomic History  . Marital status: Married    Spouse name: Not on file  . Number of children: Not on file  . Years of education: Not on file  . Highest education level: Not on file  Occupational History  . Not on file  Social Needs  . Financial resource strain: Not on file  . Food insecurity    Worry: Not on file    Inability: Not on file  . Transportation needs    Medical: Not on file    Non-medical: Not on file  Tobacco Use  . Smoking status: Never Smoker  . Smokeless tobacco: Never Used  Substance and Sexual Activity  . Alcohol use: No    Frequency: Never  . Drug use: No  . Sexual activity: Not on file  Lifestyle  . Physical activity    Days per week: Not on file    Minutes per session: Not on file  . Stress: Not on file  Relationships  . Social Herbalist on phone: Not on file    Gets together: Not on file    Attends religious service:  Not on file    Active member of club or organization: Not on file    Attends meetings of clubs or organizations: Not on file    Relationship status: Not on file  . Intimate partner violence    Fear of current or ex partner: Not on file    Emotionally abused: Not on file    Physically abused: Not on file    Forced sexual activity: Not on file  Other Topics Concern  . Not on file  Social History Narrative  . Not on file      Review of Systems  Constitutional: Negative for chills, fatigue and unexpected weight change.  HENT: Negative for congestion, rhinorrhea, sneezing and sore throat.   Eyes: Negative for photophobia, pain and redness.  Respiratory: Negative for cough, chest tightness and shortness of breath.   Cardiovascular: Negative for chest pain and palpitations.   Gastrointestinal: Negative for abdominal pain, constipation, diarrhea, nausea and vomiting.  Endocrine: Negative.   Genitourinary: Negative for dysuria and frequency.  Musculoskeletal: Negative for arthralgias, back pain, joint swelling and neck pain.  Skin: Negative for rash.  Allergic/Immunologic: Negative.   Neurological: Negative for tremors and numbness.  Hematological: Negative for adenopathy. Does not bruise/bleed easily.  Psychiatric/Behavioral: Negative for behavioral problems and sleep disturbance. The patient is not nervous/anxious.     Vital Signs: BP 138/61   Pulse 66   Resp 16   Ht 5\' 2"  (1.575 m)   Wt 107 lb 6.4 oz (48.7 kg)   SpO2 99%   BMI 19.64 kg/m    Physical Exam Vitals signs and nursing note reviewed.  Constitutional:      General: She is not in acute distress.    Appearance: She is well-developed. She is not diaphoretic.  HENT:     Head: Normocephalic and atraumatic.     Mouth/Throat:     Pharynx: No oropharyngeal exudate.  Eyes:     Pupils: Pupils are equal, round, and reactive to light.  Neck:     Musculoskeletal: Normal range of motion and neck supple.     Thyroid: No thyromegaly.     Vascular: No JVD.     Trachea: No tracheal deviation.  Cardiovascular:     Rate and Rhythm: Normal rate and regular rhythm.     Heart sounds: Normal heart sounds. No murmur. No friction rub. No gallop.   Pulmonary:     Effort: Pulmonary effort is normal. No respiratory distress.     Breath sounds: Normal breath sounds. No wheezing or rales.  Chest:     Chest wall: No tenderness.  Abdominal:     Palpations: Abdomen is soft.     Tenderness: There is no abdominal tenderness. There is no guarding.  Musculoskeletal: Normal range of motion.  Lymphadenopathy:     Cervical: No cervical adenopathy.  Skin:    General: Skin is warm and dry.  Neurological:     Mental Status: She is alert and oriented to person, place, and time.     Cranial Nerves: No cranial nerve  deficit.  Psychiatric:        Behavior: Behavior normal.        Thought Content: Thought content normal.        Judgment: Judgment normal.     Assessment/Plan: 1. Elevated serum creatinine Will get renal ultrasound, and follow up with kidney panel labs, in 6 weeks.  - US Renal; Future  2. Hypertension, unspecified type Stable, continue present meds.  3. Hyperlipidemia, unspecified  hyperlipidemia type WNL on current medication.   General Counseling: Madai verbalizes understanding of the findings of todays visit and agrees with plan of treatment. I have discussed any further diagnostic evaluation that may be needed or ordered today. We also reviewed her medications today. she has been encouraged to call the office with any questions or concerns that should arise related to todays visit.    Orders Placed This Encounter  Procedures  . US Renal    No orders of the defined types were placed in this encounter.   Time spent: 15 Minutes   This patient was seen by Orson Gear AGNP-C in Collaboration with Dr Lavera Guise as a part of collaborative care agreement     Kendell Bane AGNP-C Internal medicine

## 2019-04-25 ENCOUNTER — Other Ambulatory Visit: Payer: Self-pay | Admitting: Internal Medicine

## 2019-04-29 ENCOUNTER — Ambulatory Visit: Payer: PPO

## 2019-04-29 ENCOUNTER — Other Ambulatory Visit: Payer: Self-pay

## 2019-04-29 DIAGNOSIS — R7989 Other specified abnormal findings of blood chemistry: Secondary | ICD-10-CM | POA: Diagnosis not present

## 2019-05-06 ENCOUNTER — Telehealth: Payer: Self-pay | Admitting: Adult Health

## 2019-05-06 NOTE — Telephone Encounter (Signed)
Pt called wanting to know about ultrasound results

## 2019-05-06 NOTE — Telephone Encounter (Signed)
Overall it is good.  She has a small cyst, that we will discuss at her next appt.

## 2019-05-06 NOTE — Telephone Encounter (Signed)
PT ADVISED ON SMALL CYST ON KIDNEY WILL DISCUSS AT NEXT OV

## 2019-05-12 DIAGNOSIS — D485 Neoplasm of uncertain behavior of skin: Secondary | ICD-10-CM | POA: Diagnosis not present

## 2019-05-12 DIAGNOSIS — C44529 Squamous cell carcinoma of skin of other part of trunk: Secondary | ICD-10-CM | POA: Diagnosis not present

## 2019-05-12 DIAGNOSIS — L820 Inflamed seborrheic keratosis: Secondary | ICD-10-CM | POA: Diagnosis not present

## 2019-05-12 DIAGNOSIS — L578 Other skin changes due to chronic exposure to nonionizing radiation: Secondary | ICD-10-CM | POA: Diagnosis not present

## 2019-05-12 DIAGNOSIS — Z85828 Personal history of other malignant neoplasm of skin: Secondary | ICD-10-CM | POA: Diagnosis not present

## 2019-05-12 DIAGNOSIS — Z872 Personal history of diseases of the skin and subcutaneous tissue: Secondary | ICD-10-CM | POA: Diagnosis not present

## 2019-05-12 DIAGNOSIS — C44629 Squamous cell carcinoma of skin of left upper limb, including shoulder: Secondary | ICD-10-CM | POA: Diagnosis not present

## 2019-05-12 DIAGNOSIS — Z86007 Personal history of in-situ neoplasm of skin: Secondary | ICD-10-CM | POA: Diagnosis not present

## 2019-05-19 ENCOUNTER — Other Ambulatory Visit: Payer: Self-pay | Admitting: Adult Health

## 2019-05-19 DIAGNOSIS — R7989 Other specified abnormal findings of blood chemistry: Secondary | ICD-10-CM | POA: Diagnosis not present

## 2019-05-20 LAB — RENAL FUNCTION PANEL
Albumin: 4.3 g/dL (ref 3.7–4.7)
BUN/Creatinine Ratio: 14 (ref 12–28)
BUN: 15 mg/dL (ref 8–27)
CO2: 25 mmol/L (ref 20–29)
Calcium: 9.3 mg/dL (ref 8.7–10.3)
Chloride: 104 mmol/L (ref 96–106)
Creatinine, Ser: 1.06 mg/dL — ABNORMAL HIGH (ref 0.57–1.00)
GFR calc Af Amer: 60 mL/min/{1.73_m2} (ref >59)
GFR calc non Af Amer: 52 mL/min/{1.73_m2} — ABNORMAL LOW (ref >59)
Glucose: 96 mg/dL (ref 65–99)
Phosphorus: 3.6 mg/dL (ref 3.0–4.3)
Potassium: 4.4 mmol/L (ref 3.5–5.2)
Sodium: 141 mmol/L (ref 134–144)

## 2019-05-24 ENCOUNTER — Ambulatory Visit (INDEPENDENT_AMBULATORY_CARE_PROVIDER_SITE_OTHER): Payer: PPO | Admitting: Adult Health

## 2019-05-24 ENCOUNTER — Other Ambulatory Visit: Payer: Self-pay

## 2019-05-24 ENCOUNTER — Encounter: Payer: Self-pay | Admitting: Adult Health

## 2019-05-24 VITALS — BP 140/80 | HR 60 | Resp 16 | Ht 62.0 in | Wt 105.0 lb

## 2019-05-24 DIAGNOSIS — R7989 Other specified abnormal findings of blood chemistry: Secondary | ICD-10-CM | POA: Diagnosis not present

## 2019-05-24 DIAGNOSIS — M8580 Other specified disorders of bone density and structure, unspecified site: Secondary | ICD-10-CM | POA: Diagnosis not present

## 2019-05-24 DIAGNOSIS — I10 Essential (primary) hypertension: Secondary | ICD-10-CM | POA: Diagnosis not present

## 2019-05-24 DIAGNOSIS — Z780 Asymptomatic menopausal state: Secondary | ICD-10-CM

## 2019-05-24 DIAGNOSIS — E785 Hyperlipidemia, unspecified: Secondary | ICD-10-CM | POA: Diagnosis not present

## 2019-05-24 NOTE — Progress Notes (Addendum)
[facility] [place], [place] [address]  Internal MEDICINE  Office Visit Note  Patient Name: Joann Warren  [id number]  [id number]  Date of Service: 06/22/2019  Chief Complaint  Patient presents with  . Follow-up    U/S  . Hypertension  . Hyperlipidemia    HPI  Pt is here for follow up on renal ultrasound.  Overall she is doing well.  The ultrasound showed a renal cyst, that we will continue to monitor.  She denies any symptoms.  Her creatinine is slightly bumped at 1.06. She would like to discuss stopping her Fosamax since she heard about fractures with people in prolonged use.  She denies any recent issues.    Current Medication: Outpatient Encounter Medications as of 05/24/2019  Medication Sig  . alendronate (FOSAMAX) 70 MG tablet TAKE 1 TABLET EVERY 7 DAYS WITH A FULL GLASS OF WATER ON AN EMPTY STOMACH DO NOT LIE DOWN FOR AT LEAST 30 MIN  . bimatoprost (LUMIGAN) 0.03 % ophthalmic solution   . bisoprolol-hydrochlorothiazide (ZIAC) 2.5-6.25 MG tablet TAKE ONE TABLET EVERY MORNING  . hydrOXYzine (ATARAX/VISTARIL) 10 MG tablet Take 10 mg by mouth 3 (three) times daily as needed.  Marland Kitchen ibuprofen (ADVIL,MOTRIN) 600 MG tablet Take 1 tablet (600 mg total) by mouth every 6 (six) hours as needed.  . [DISCONTINUED] amLODipine (NORVASC) 5 MG tablet TAKE 1 TABLET BY MOUTH DAILY  . [DISCONTINUED] simvastatin (ZOCOR) 20 MG tablet TAKE 1 TABLET AT BEDTIME   No facility-administered encounter medications on file as of 05/24/2019.     Surgical History: Past Surgical History:  Procedure Laterality Date  . BREAST EXCISIONAL BIOPSY Left 1972   neg  . BREAST EXCISIONAL BIOPSY Right 1980   neg  . COLONOSCOPY    . ESOPHAGOGASTRODUODENOSCOPY (EGD) WITH PROPOFOL N/A 04/09/2015   Procedure: ESOPHAGOGASTRODUODENOSCOPY (EGD) WITH PROPOFOL;  Surgeon: Manya Silvas, MD;  Location[facility] ENDOSCOPY;  Service: Endoscopy;  Laterality: N/A;  . skin cancer removal      Medical  History: Past Medical History:  Diagnosis Date  . Anemia   . Cancer (HCC)    squamous cell  . Glaucoma   . Hyperlipidemia   . Hypertension   . Osteoporosis     Family History: Family History  Problem Relation Age of Onset  . Breast cancer Sister 13  . Atrial fibrillation Mother     Social History   Socioeconomic History  . Marital status: Married    Spouse name: Not on file  . Number of children: Not on file  . Years of education: Not on file  . Highest education level: Not on file  Occupational History  . Not on file  Social Needs  . Financial resource strain: Not on file  . Food insecurity    Worry: Not on file    Inability: Not on file  . Transportation needs    Medical: Not on file    Non-medical: Not on file  Tobacco Use  . Smoking status: Never Smoker  . Smokeless tobacco: Never Used  Substance and Sexual Activity  . Alcohol use: No    Frequency: Never  . Drug use: No  . Sexual activity: Not on file  Lifestyle  . Physical activity    Days per week: Not on file    Minutes per session: Not on file  . Stress: Not on file  Relationships  . Social Herbalist on phone: Not on file    Gets  together: Not on file    Attends religious service: Not on file    Active member of club or organization: Not on file    Attends meetings of clubs or organizations: Not on file    Relationship status: Not on file  . Intimate partner violence    Fear of current or ex partner: Not on file    Emotionally abused: Not on file    Physically abused: Not on file    Forced sexual activity: Not on file  Other Topics Concern  . Not on file  Social History Narrative  . Not on file      Review of Systems  Constitutional: Negative for chills, fatigue and unexpected weight change.  HENT: Negative for congestion, rhinorrhea, sneezing and sore throat.   Eyes: Negative for photophobia, pain and redness.  Respiratory: Negative for cough, chest tightness and shortness  of breath.   Cardiovascular: Negative for chest pain and palpitations.  Gastrointestinal: Negative for abdominal pain, constipation, diarrhea, nausea and vomiting.  Endocrine: Negative.   Genitourinary: Negative for dysuria and frequency.  Musculoskeletal: Negative for arthralgias, back pain, joint swelling and neck pain.  Skin: Negative for rash.  Allergic/Immunologic: Negative.   Neurological: Negative for tremors and numbness.  Hematological: Negative for adenopathy. Does not bruise/bleed easily.  Psychiatric/Behavioral: Negative for behavioral problems and sleep disturbance. The patient is not nervous/anxious.     Vital Signs: BP 140/80   Pulse 60   Resp 16   Ht 5\' 2"  (1.575 m)   Wt 105 lb (47.6 kg)   SpO2 97%   BMI 19.20 kg/m    Physical Exam Vitals signs and nursing note reviewed.  Constitutional:      General: She is not in acute distress.    Appearance: She is well-developed. She is not diaphoretic.  HENT:     Head: Normocephalic and atraumatic.     Mouth/Throat:     Pharynx: No oropharyngeal exudate.  Eyes:     Pupils: Pupils are equal, round, and reactive to light.  Neck:     Musculoskeletal: Normal range of motion and neck supple.     Thyroid: No thyromegaly.     Vascular: No JVD.     Trachea: No tracheal deviation.  Cardiovascular:     Rate and Rhythm: Normal rate and regular rhythm.     Heart sounds: Normal heart sounds. No murmur. No friction rub. No gallop.   Pulmonary:     Effort: Pulmonary effort is normal. No respiratory distress.     Breath sounds: Normal breath sounds. No wheezing or rales.  Chest:     Chest wall: No tenderness.  Abdominal:     Palpations: Abdomen is soft.     Tenderness: There is no abdominal tenderness. There is no guarding.  Musculoskeletal: Normal range of motion.  Lymphadenopathy:     Cervical: No cervical adenopathy.  Skin:    General: Skin is warm and dry.  Neurological:     Mental Status: She is alert and oriented  to person, place, and time.     Cranial Nerves: No cranial nerve deficit.  Psychiatric:        Behavior: Behavior normal.        Thought Content: Thought content normal.        Judgment: Judgment normal.    Assessment/Plan: 1. Osteopenia after menopause Will get Bone density test to evaluate her osteopenia.  - DG Bone Density; Future  2. Hypertension, unspecified type Stable, continue present management.  3. Hyperlipidemia, unspecified hyperlipidemia type Stable, continue present management.   4. Elevated serum creatinine Will continue to monitor.   General Counseling: Tashena verbalizes understanding of the findings of todays visit and agrees with plan of treatment. I have discussed any further diagnostic evaluation that may be needed or ordered today. We also reviewed her medications today. she has been encouraged to call the office with any questions or concerns that should arise related to todays visit.    Orders Placed This Encounter  Procedures  . DG Bone Density      Time spent: 15 Minutes   This patient was seen by Orson Gear AGNP-C in Collaboration with Dr Lavera Guise as a part of collaborative care agreement     Kendell Bane AGNP-C Internal medicine

## 2019-05-25 ENCOUNTER — Ambulatory Visit
Admission: RE | Admit: 2019-05-25 | Discharge: 2019-05-25 | Disposition: A | Discharge status: Home / Self Care | Payer: PPO | Source: Ambulatory Visit | Attending: Adult Health | Admitting: Adult Health

## 2019-05-25 DIAGNOSIS — M85852 Other specified disorders of bone density and structure, left thigh: Secondary | ICD-10-CM | POA: Diagnosis not present

## 2019-05-25 DIAGNOSIS — Z13820 Encounter for screening for osteoporosis: Secondary | ICD-10-CM | POA: Diagnosis not present

## 2019-05-25 DIAGNOSIS — Z780 Asymptomatic menopausal state: Secondary | ICD-10-CM | POA: Diagnosis not present

## 2019-05-25 DIAGNOSIS — M8580 Other specified disorders of bone density and structure, unspecified site: Secondary | ICD-10-CM | POA: Insufficient documentation

## 2019-06-16 ENCOUNTER — Other Ambulatory Visit: Payer: Self-pay

## 2019-06-16 MED ORDER — SIMVASTATIN 20 MG PO TABS: 20.0000 mg | ORAL_TABLET | Freq: Every day | ORAL | 1 refills | Status: DC

## 2019-06-16 MED ORDER — AMLODIPINE BESYLATE 5 MG PO TABS: 5.0000 mg | ORAL_TABLET | Freq: Every day | ORAL | 1 refills | Status: DC

## 2019-06-22 NOTE — Addendum Note (Signed)
Addended by: Lavera Guise on: 06/22/2019 07:20 AM   Modules accepted: Level of Service

## 2019-06-23 DIAGNOSIS — C44722 Squamous cell carcinoma of skin of right lower limb, including hip: Secondary | ICD-10-CM | POA: Diagnosis not present

## 2019-06-23 DIAGNOSIS — L570 Actinic keratosis: Secondary | ICD-10-CM | POA: Diagnosis not present

## 2019-06-23 DIAGNOSIS — C44729 Squamous cell carcinoma of skin of left lower limb, including hip: Secondary | ICD-10-CM | POA: Diagnosis not present

## 2019-06-23 DIAGNOSIS — L578 Other skin changes due to chronic exposure to nonionizing radiation: Secondary | ICD-10-CM | POA: Diagnosis not present

## 2019-06-23 DIAGNOSIS — Q828 Other specified congenital malformations of skin: Secondary | ICD-10-CM | POA: Diagnosis not present

## 2019-06-23 DIAGNOSIS — L700 Acne vulgaris: Secondary | ICD-10-CM | POA: Diagnosis not present

## 2019-06-23 DIAGNOSIS — Z85828 Personal history of other malignant neoplasm of skin: Secondary | ICD-10-CM | POA: Diagnosis not present

## 2019-07-01 ENCOUNTER — Telehealth: Payer: Self-pay

## 2019-07-01 NOTE — Telephone Encounter (Signed)
Called lmom informing patient of appointment. klh 

## 2019-07-05 ENCOUNTER — Other Ambulatory Visit: Payer: Self-pay

## 2019-07-05 ENCOUNTER — Ambulatory Visit (INDEPENDENT_AMBULATORY_CARE_PROVIDER_SITE_OTHER): Payer: PPO | Admitting: Internal Medicine

## 2019-07-05 ENCOUNTER — Encounter: Payer: Self-pay | Admitting: Adult Health

## 2019-07-05 DIAGNOSIS — E538 Deficiency of other specified B group vitamins: Secondary | ICD-10-CM | POA: Diagnosis not present

## 2019-07-05 DIAGNOSIS — M810 Age-related osteoporosis without current pathological fracture: Secondary | ICD-10-CM

## 2019-07-05 DIAGNOSIS — E785 Hyperlipidemia, unspecified: Secondary | ICD-10-CM

## 2019-07-05 DIAGNOSIS — I10 Essential (primary) hypertension: Secondary | ICD-10-CM

## 2019-07-05 MED ORDER — CYANOCOBALAMIN 1000 MCG/ML IJ SOLN
1000.0000 ug | Freq: Once | INTRAMUSCULAR | Status: AC
  Administered 2019-07-05: 1000 ug via INTRAMUSCULAR

## 2019-07-05 NOTE — Progress Notes (Signed)
[facility] [place], [place] [address]  Internal MEDICINE  Office Visit Note  Patient Name: Joann Warren  [id number]  [id number]  Date of Service: 07/11/2019  Chief Complaint  Patient presents with  . Hypertension  . Osteoporosis  . Follow-up    labs     HPI Pt is here to follow up on her labs and BMD results. She use to get B12 injections however were stopped almost a year ago. Her level is 314 now. Slightly elevated Cr as well. BP is under good control. BMD did show osteopenia and osteoporosis, has questions about long term use of biphosphonates and bone FX  ( atypical hip fx)   Pt has protrusion of right eye, She says she has it for a long time followed by ophthalmology, however it looks more pronounced since pt is using her mask    Current Medication: Outpatient Encounter Medications as of 07/05/2019  Medication Sig  . alendronate (FOSAMAX) 70 MG tablet TAKE 1 TABLET EVERY 7 DAYS WITH A FULL GLASS OF WATER ON AN EMPTY STOMACH DO NOT LIE DOWN FOR AT LEAST 30 MIN  . amLODipine (NORVASC) 5 MG tablet Take 1 tablet (5 mg total) by mouth daily.  . bimatoprost (LUMIGAN) 0.03 % ophthalmic solution   . bisoprolol-hydrochlorothiazide (ZIAC) 2.5-6.25 MG tablet TAKE ONE TABLET EVERY MORNING  . hydrOXYzine (ATARAX/VISTARIL) 10 MG tablet Take 10 mg by mouth 3 (three) times daily as needed.  Marland Kitchen ibuprofen (ADVIL,MOTRIN) 600 MG tablet Take 1 tablet (600 mg total) by mouth every 6 (six) hours as needed.  . simvastatin (ZOCOR) 20 MG tablet Take 1 tablet (20 mg total) by mouth at bedtime.  . [EXPIRED] cyanocobalamin ((VITAMIN B-12)) injection 1,000 mcg    No facility-administered encounter medications on file as of 07/05/2019.     Surgical History: Past Surgical History:  Procedure Laterality Date  . BREAST EXCISIONAL BIOPSY Left 1972   neg  . BREAST EXCISIONAL BIOPSY Right 1980   neg  . COLONOSCOPY    . ESOPHAGOGASTRODUODENOSCOPY (EGD) WITH PROPOFOL N/A  04/09/2015   Procedure: ESOPHAGOGASTRODUODENOSCOPY (EGD) WITH PROPOFOL;  Surgeon: Manya Silvas, MD;  Location: [facility] ENDOSCOPY;  Service: Endoscopy;  Laterality: N/A;  . skin cancer removal      Medical History: Past Medical History:  Diagnosis Date  . Anemia   . Cancer (HCC)    squamous cell  . Glaucoma   . Hyperlipidemia   . Hypertension   . Osteoporosis     Family History: Family History  Problem Relation Age of Onset  . Breast cancer Sister 28  . Atrial fibrillation Mother     Social History   Socioeconomic History  . Marital status: Married    Spouse name: Not on file  . Number of children: Not on file  . Years of education: Not on file  . Highest education level: Not on file  Occupational History  . Not on file  Social Needs  . Financial resource strain: Not on file  . Food insecurity    Worry: Not on file    Inability: Not on file  . Transportation needs    Medical: Not on file    Non-medical: Not on file  Tobacco Use  . Smoking status: Never Smoker  . Smokeless tobacco: Never Used  Substance and Sexual Activity  . Alcohol use: No    Frequency: Never  . Drug use: No  . Sexual activity: Not on file  Lifestyle  . Physical  activity    Days per week: Not on file    Minutes per session: Not on file  . Stress: Not on file  Relationships  . Social Herbalist on phone: Not on file    Gets together: Not on file    Attends religious service: Not on file    Active member of club or organization: Not on file    Attends meetings of clubs or organizations: Not on file    Relationship status: Not on file  . Intimate partner violence    Fear of current or ex partner: Not on file    Emotionally abused: Not on file    Physically abused: Not on file    Forced sexual activity: Not on file  Other Topics Concern  . Not on file  Social History Narrative  . Not on file    Review of Systems  Constitutional: Negative for chills, diaphoresis and  fatigue.  HENT: Negative for ear pain, postnasal drip and sinus pressure.   Eyes: Negative for photophobia, discharge, redness, itching and visual disturbance.  Respiratory: Negative for cough, shortness of breath and wheezing.   Cardiovascular: Negative for chest pain, palpitations and leg swelling.  Gastrointestinal: Negative for abdominal pain, constipation, diarrhea, nausea and vomiting.  Genitourinary: Negative for dysuria and flank pain.  Musculoskeletal: Negative for arthralgias, back pain, gait problem and neck pain.  Skin: Negative for color change.  Allergic/Immunologic: Negative for environmental allergies and food allergies.  Neurological: Negative for dizziness and headaches.  Hematological: Does not bruise/bleed easily.  Psychiatric/Behavioral: Negative for agitation, behavioral problems (depression) and hallucinations.    Vital Signs: BP 139/81   Pulse 61   Temp 98 F (36.7 C)   Resp 16   Ht 5\' 1"  (1.549 m)   Wt 107 lb (48.5 kg)   SpO2 98%   BMI 20.22 kg/m    Physical Exam Constitutional:      Appearance: Normal appearance.  HENT:     Head: Normocephalic and atraumatic.  Eyes:     Extraocular Movements: Extraocular movements intact.     Comments: right eye has more protrusion( she thinks she has it for a while) wil  Cardiovascular:     Rate and Rhythm: Normal rate and regular rhythm.     Pulses: Normal pulses.     Heart sounds: Normal heart sounds.  Pulmonary:     Effort: Pulmonary effort is normal.     Breath sounds: Normal breath sounds.  Genitourinary:    General: Normal vulva.  Neurological:     General: No focal deficit present.     Mental Status: She is alert and oriented to person, place, and time.     Motor: No weakness.     Coordination: Coordination normal.  Psychiatric:        Behavior: Behavior normal.    Assessment/Plan: 1. Hypertension, unspecified type - Continue Norvasc and bisoprolol  2. B12 deficiency - Cyanocobalamin  ((VITAMIN B-12)) injection 1,000 mcg - She will start take oral B12 supplement as well, will need B12 injection at each visit  3. Hyperlipidemia, unspecified hyperlipidemia type - Controlled   4. Senile osteoporosis - Pt will not take Fosamax for next 4-6 months due to being worried about side effects of atypical hip fracture however will continuee later on, Continue with calcium and vitd   General Counseling: Jadelyn verbalizes understanding of the findings of todays visit and agrees with plan of treatment. I have discussed any further diagnostic evaluation that  may be needed or ordered today. We also reviewed her medications today. she has been encouraged to call the office with any questions or concerns that should arise related to todays visit.   Meds ordered this encounter  Medications  . cyanocobalamin ((VITAMIN B-12)) injection 1,000 mcg    Time spent: 25 Minutes   Dr Lavera Guise Internal medicine

## 2019-07-18 ENCOUNTER — Encounter: Payer: Self-pay | Admitting: Nurse Practitioner

## 2019-07-18 ENCOUNTER — Ambulatory Visit (INDEPENDENT_AMBULATORY_CARE_PROVIDER_SITE_OTHER): Payer: PPO | Admitting: Nurse Practitioner

## 2019-07-18 ENCOUNTER — Other Ambulatory Visit: Payer: Self-pay

## 2019-07-18 VITALS — BP 147/70 | HR 70 | Resp 16 | Ht 62.0 in | Wt 106.6 lb

## 2019-07-18 DIAGNOSIS — H6121 Impacted cerumen, right ear: Secondary | ICD-10-CM | POA: Diagnosis not present

## 2019-07-18 NOTE — Progress Notes (Signed)
[facility] [place], [place] [address]  Internal MEDICINE  Office Visit Note  Patient Name: Joann Warren  [id number]  [id number]  Date of Service: 07/18/2019   Pt is here for a sick visit.  Chief Complaint  Patient presents with  . Ear Problem    right ear, cant hear well, tried otc but did not work      The patient is here for acute visit. She states that her right ear feels very plugged and she is unable to hear well out of it. She states that she has no pain in the ear. She has no congestion or fever or other symptoms. She states that she does not have symptoms in the left ear.        Current Medication:  Outpatient Encounter Medications as of 07/18/2019  Medication Sig  . alendronate (FOSAMAX) 70 MG tablet TAKE 1 TABLET EVERY 7 DAYS WITH A FULL GLASS OF WATER ON AN EMPTY STOMACH DO NOT LIE DOWN FOR AT LEAST 30 MIN  . amLODipine (NORVASC) 5 MG tablet Take 1 tablet (5 mg total) by mouth daily.  . bimatoprost (LUMIGAN) 0.03 % ophthalmic solution   . bisoprolol-hydrochlorothiazide (ZIAC) 2.5-6.25 MG tablet TAKE ONE TABLET EVERY MORNING  . hydrOXYzine (ATARAX/VISTARIL) 10 MG tablet Take 10 mg by mouth 3 (three) times daily as needed.  Marland Kitchen ibuprofen (ADVIL,MOTRIN) 600 MG tablet Take 1 tablet (600 mg total) by mouth every 6 (six) hours as needed.  . simvastatin (ZOCOR) 20 MG tablet Take 1 tablet (20 mg total) by mouth at bedtime.   No facility-administered encounter medications on file as of 07/18/2019.       Medical History: Past Medical History:  Diagnosis Date  . Anemia   . Cancer (HCC)    squamous cell  . Glaucoma   . Hyperlipidemia   . Hypertension   . Osteoporosis      Today's Vitals   07/18/19 1407  BP: (!) 147/70  Pulse: 70  Resp: 16  SpO2: 97%  Weight: 106 lb 9.6 oz (48.4 kg)  Height: 5\' 2"  (1.575 m)   Body mass index is 19.5 kg/m.  Review of Systems  Constitutional: Negative for chills, fatigue and unexpected  weight change.  HENT: Negative for congestion, postnasal drip, rhinorrhea, sneezing and sore throat.        Right ear feels clogged and she is unable to hear well out of the right ear.   Respiratory: Negative for cough, chest tightness, shortness of breath and wheezing.   Cardiovascular: Negative for chest pain and palpitations.  Gastrointestinal: Negative for abdominal pain, constipation, diarrhea, nausea and vomiting.  Musculoskeletal: Negative for arthralgias, back pain, joint swelling and neck pain.  Skin: Negative for rash.  Allergic/Immunologic: Negative for environmental allergies.  Neurological: Negative for tremors, numbness and headaches.  Hematological: Negative for adenopathy. Does not bruise/bleed easily.  Psychiatric/Behavioral: Negative for behavioral problems (Depression), sleep disturbance and suicidal ideas. The patient is not nervous/anxious.     Physical Exam Vitals signs and nursing note reviewed.  Constitutional:      Appearance: Normal appearance.  HENT:     Right Ear: Decreased hearing noted. There is impacted cerumen.     Left Ear: Ear canal and external ear normal.     Ears:     Comments: Ear lavage performed of right ear. Patient tolerated the procedure well. Was able to hear better after irrigation. There was minimal cerumen in externa canal of right ear after ear  lavage.  Eyes:     Pupils: Pupils are equal, round, and reactive to light.  Neck:     Musculoskeletal: Normal range of motion and neck supple.  Cardiovascular:     Heart sounds: Normal heart sounds.  Pulmonary:     Effort: Pulmonary effort is normal.  Neurological:     Mental Status: She is alert and oriented to person, place, and time.  Psychiatric:        Mood and Affect: Mood normal.    Assessment/Plan: 1. Impacted cerumen of right ear Ear lavage performed of right ear. Patient tolerated the procedure well. Was able to hear better after irrigation. There was minimal cerumen in externa  canal of right ear after ear lavage.  - Ear Lavage  General Counseling: [name] verbalizes understanding of the findings of todays visit and agrees with plan of treatment. I have discussed any further diagnostic evaluation that may be needed or ordered today. We also reviewed her medications today. she has been encouraged to call the office with any questions or concerns that should arise related to todays visit.    Counseling:    Orders Placed This Encounter  Procedures  . Ear Lavage      Time spent: 25 Minutes

## 2019-07-20 DIAGNOSIS — L570 Actinic keratosis: Secondary | ICD-10-CM | POA: Diagnosis not present

## 2019-07-20 DIAGNOSIS — C44529 Squamous cell carcinoma of skin of other part of trunk: Secondary | ICD-10-CM | POA: Diagnosis not present

## 2019-07-20 DIAGNOSIS — C44622 Squamous cell carcinoma of skin of right upper limb, including shoulder: Secondary | ICD-10-CM | POA: Diagnosis not present

## 2019-07-20 DIAGNOSIS — L578 Other skin changes due to chronic exposure to nonionizing radiation: Secondary | ICD-10-CM | POA: Diagnosis not present

## 2019-07-20 DIAGNOSIS — D485 Neoplasm of uncertain behavior of skin: Secondary | ICD-10-CM | POA: Diagnosis not present

## 2019-07-20 DIAGNOSIS — Z85828 Personal history of other malignant neoplasm of skin: Secondary | ICD-10-CM | POA: Diagnosis not present

## 2019-07-25 ENCOUNTER — Other Ambulatory Visit: Payer: Self-pay | Admitting: Internal Medicine

## 2019-07-25 DIAGNOSIS — H0520 Unspecified exophthalmos: Secondary | ICD-10-CM

## 2019-07-26 ENCOUNTER — Other Ambulatory Visit: Payer: Self-pay | Admitting: Adult Health

## 2019-08-03 DIAGNOSIS — C44722 Squamous cell carcinoma of skin of right lower limb, including hip: Secondary | ICD-10-CM | POA: Diagnosis not present

## 2019-08-03 DIAGNOSIS — L570 Actinic keratosis: Secondary | ICD-10-CM | POA: Diagnosis not present

## 2019-08-03 DIAGNOSIS — C44729 Squamous cell carcinoma of skin of left lower limb, including hip: Secondary | ICD-10-CM | POA: Diagnosis not present

## 2019-08-03 DIAGNOSIS — L578 Other skin changes due to chronic exposure to nonionizing radiation: Secondary | ICD-10-CM | POA: Diagnosis not present

## 2019-08-03 DIAGNOSIS — Z85828 Personal history of other malignant neoplasm of skin: Secondary | ICD-10-CM | POA: Diagnosis not present

## 2019-08-08 ENCOUNTER — Ambulatory Visit
Admission: RE | Admit: 2019-08-08 | Discharge: 2019-08-08 | Disposition: A | Discharge status: Home / Self Care | Payer: PPO | Source: Ambulatory Visit | Attending: Internal Medicine | Admitting: Internal Medicine

## 2019-08-08 ENCOUNTER — Other Ambulatory Visit: Payer: Self-pay

## 2019-08-08 DIAGNOSIS — H5461 Unqualified visual loss, right eye, normal vision left eye: Secondary | ICD-10-CM | POA: Diagnosis not present

## 2019-08-08 DIAGNOSIS — H0520 Unspecified exophthalmos: Secondary | ICD-10-CM | POA: Insufficient documentation

## 2019-08-08 LAB — POCT I-STAT CREATININE: Creatinine, Ser: 1.2 mg/dL — ABNORMAL HIGH (ref 0.44–1.00)

## 2019-08-08 MED ORDER — GADOBUTROL 1 MMOL/ML IV SOLN
5.0000 mL | Freq: Once | INTRAVENOUS | Status: AC | PRN
  Administered 2019-08-08: 5 mL via INTRAVENOUS

## 2019-08-30 ENCOUNTER — Other Ambulatory Visit: Payer: Self-pay

## 2019-08-30 ENCOUNTER — Encounter: Payer: Self-pay | Admitting: Internal Medicine

## 2019-08-30 ENCOUNTER — Ambulatory Visit (INDEPENDENT_AMBULATORY_CARE_PROVIDER_SITE_OTHER): Payer: PPO | Admitting: Internal Medicine

## 2019-08-30 VITALS — Ht 62.0 in | Wt 107.0 lb

## 2019-08-30 DIAGNOSIS — M810 Age-related osteoporosis without current pathological fracture: Secondary | ICD-10-CM

## 2019-08-30 DIAGNOSIS — I10 Essential (primary) hypertension: Secondary | ICD-10-CM

## 2019-08-30 DIAGNOSIS — G319 Degenerative disease of nervous system, unspecified: Secondary | ICD-10-CM | POA: Diagnosis not present

## 2019-08-30 DIAGNOSIS — H401112 Primary open-angle glaucoma, right eye, moderate stage: Secondary | ICD-10-CM | POA: Diagnosis not present

## 2019-08-30 MED ORDER — ALENDRONATE SODIUM 70 MG PO TABS: ORAL_TABLET | ORAL | 4 refills | Status: DC

## 2019-08-30 MED ORDER — DONEPEZIL HCL 5 MG PO TABS: 5.0000 mg | ORAL_TABLET | Freq: Every day | ORAL | 3 refills | Status: DC

## 2019-08-30 NOTE — Progress Notes (Signed)
[facility] [place], [place] [address]  Internal MEDICINE  Telephone Visit  Patient Name: Joann Warren  [id number]  [id number]  Date of Service: 09/06/2019  I connected with the patient at [phone] by telephone and verified the patients identity using two identifiers.   I discussed the limitations, risks, security and privacy concerns of performing an evaluation and management service by telephone and the availability of in person appointments. I also discussed with the patient that there may be a patient responsible charge related to the service.  The patient expressed understanding and agrees to proceed.    Chief Complaint  Patient presents with  . Telephone Assessment    QN:[id number]  . Telephone Screen  . Follow-up    MRI   HPI Pt is connected via telephone due to risk of pandemic of covid -19. She was noticed to have slight protrusion of her right eye and RI was ordered. She does not have any lesion or masses in her brain. She feels well, she also had BMD done for follow up as she has concerns for hip/femur fractures due to biphosphonates Her MRI does show the following  1. No acute intracranial abnormality or mass. 2. 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  Current Medication: Outpatient Encounter Medications as of 08/30/2019  Medication Sig  . alendronate (FOSAMAX) 70 MG tablet TAKE 1 TABLET EVERY 7 DAYS WITH A FULL GLASS OF WATER ON AN EMPTY STOMACH DO NOT LIE DOWN FOR AT LEAST 30 MIN  . amLODipine (NORVASC) 5 MG tablet Take 1 tablet (5 mg total) by mouth daily.  . bimatoprost (LUMIGAN) 0.03 % ophthalmic solution   . bisoprolol-hydrochlorothiazide (ZIAC) 2.5-6.25 MG tablet TAKE ONE TABLET EVERY MORNING  . hydrOXYzine (ATARAX/VISTARIL) 10 MG tablet Take 10 mg by mouth 3 (three) times daily as needed.  Marland Kitchen ibuprofen (ADVIL,MOTRIN) 600 MG tablet Take 1 tablet (600 mg total) by mouth every 6 (six) hours as needed.  .  simvastatin (ZOCOR) 20 MG tablet Take 1 tablet (20 mg total) by mouth at bedtime.  . [DISCONTINUED] alendronate (FOSAMAX) 70 MG tablet TAKE 1 TABLET EVERY 7 DAYS WITH A FULL GLASS OF WATER ON AN EMPTY STOMACH DO NOT LIE DOWN FOR AT LEAST 30 MIN  . donepezil (ARICEPT) 5 MG tablet Take 1 tablet (5 mg total) by mouth at bedtime. For memory   No facility-administered encounter medications on file as of 08/30/2019.    Surgical History: Past Surgical History:  Procedure Laterality Date  . BREAST EXCISIONAL BIOPSY Left 1972   neg  . BREAST EXCISIONAL BIOPSY Right 1980   neg  . COLONOSCOPY    . ESOPHAGOGASTRODUODENOSCOPY (EGD) WITH PROPOFOL N/A 04/09/2015   Procedure: ESOPHAGOGASTRODUODENOSCOPY (EGD) WITH PROPOFOL;  Surgeon: Manya Silvas, MD;  Location: [facility] ENDOSCOPY;  Service: Endoscopy;  Laterality: N/A;  . skin cancer removal      Medical History: Past Medical History:  Diagnosis Date  . Anemia   . Cancer (HCC)    squamous cell  . Glaucoma   . Hyperlipidemia   . Hypertension   . Osteoporosis     Family History: Family History  Problem Relation Age of Onset  . Breast cancer Sister 28  . Atrial fibrillation Mother     Social History   Socioeconomic History  . Marital status: Married    Spouse name: Not on file  . Number of children: Not on file  . Years of education: Not on file  .  Highest education level: Not on file  Occupational History  . Not on file  Tobacco Use  . Smoking status: Never Smoker  . Smokeless tobacco: Never Used  Substance and Sexual Activity  . Alcohol use: No  . Drug use: No  . Sexual activity: Not on file  Other Topics Concern  . Not on file  Social History Narrative  . Not on file   Social Determinants of Health   Financial Resource Strain:   . Difficulty of Paying Living Expenses: Not on file  Food Insecurity:   . Worried About Charity fundraiser in the Last Year: Not on file  . Ran Out of Food in the Last Year: Not on file   Transportation Needs:   . Lack of Transportation (Medical): Not on file  . Lack of Transportation (Non-Medical): Not on file  Physical Activity:   . Days of Exercise per Week: Not on file  . Minutes of Exercise per Session: Not on file  Stress:   . Feeling of Stress : Not on file  Social Connections:   . Frequency of Communication with Friends and Family: Not on file  . Frequency of Social Gatherings with Friends and Family: Not on file  . Attends Religious Services: Not on file  . Active Member of Clubs or Organizations: Not on file  . Attends Archivist Meetings: Not on file  . Marital Status: Not on file  Intimate Partner Violence:   . Fear of Current or Ex-Partner: Not on file  . Emotionally Abused: Not on file  . Physically Abused: Not on file  . Sexually Abused: Not on file      Review of Systems  Constitutional: Negative for chills, diaphoresis and fatigue.  HENT: Negative for ear pain, postnasal drip and sinus pressure.   Eyes: Negative for photophobia, discharge, redness, itching and visual disturbance.  Respiratory: Negative for cough, shortness of breath and wheezing.   Cardiovascular: Negative for chest pain, palpitations and leg swelling.  Gastrointestinal: Negative for abdominal pain, constipation, diarrhea, nausea and vomiting.  Genitourinary: Negative for dysuria and flank pain.  Musculoskeletal: Negative for arthralgias, back pain, gait problem and neck pain.  Skin: Negative for color change.  Allergic/Immunologic: Negative for environmental allergies and food allergies.  Neurological: Negative for dizziness and headaches.  Hematological: Does not bruise/bleed easily.  Psychiatric/Behavioral: Negative for agitation, behavioral problems (depression) and hallucinations.    Vital Signs: Ht 5\' 2"  (1.575 m)   Wt 107 lb (48.5 kg)   BMI 19.57 kg/m    Observation/Objective: NAD, pleasant to talk    Assessment/Plan: 1. Diffuse cerebral atrophy  ([place]) - Discussion about her MRI findings and mutual decision is made to start her on Aricept.  - donepezil (ARICEPT) 5 MG tablet; Take 1 tablet (5 mg total) by mouth at bedtime. For memory  Dispense: 90 tablet; Refill: 3  2. Senile osteoporosis - Restart Fosamax  - alendronate (FOSAMAX) 70 MG tablet; TAKE 1 TABLET EVERY 7 DAYS WITH A FULL GLASS OF WATER ON AN EMPTY STOMACH DO NOT LIE DOWN FOR AT LEAST 30 MIN  Dispense: 12 tablet; Refill: 4  3. Hypertension, unspecified type - Continue meds   General Counseling: Raissa verbalizes understanding of the findings of today's phone visit and agrees with plan of treatment. I have discussed any further diagnostic evaluation that may be needed or ordered today. We also reviewed her medications today. she has been encouraged to call the office with any questions or concerns that  should arise related to todays visit.  Meds ordered this encounter  Medications  . alendronate (FOSAMAX) 70 MG tablet    Sig: TAKE 1 TABLET EVERY 7 DAYS WITH A FULL GLASS OF WATER ON AN EMPTY STOMACH DO NOT LIE DOWN FOR AT LEAST 30 MIN    Dispense:  12 tablet    Refill:  4    FOR NEXT FILL. THANK YOU  . donepezil (ARICEPT) 5 MG tablet    Sig: Take 1 tablet (5 mg total) by mouth at bedtime. For memory    Dispense:  90 tablet    Refill:  3    Time spent:15 Minutes  Dr Lavera Guise Internal medicine

## 2019-09-05 DIAGNOSIS — C44619 Basal cell carcinoma of skin of left upper limb, including shoulder: Secondary | ICD-10-CM | POA: Diagnosis not present

## 2019-09-05 DIAGNOSIS — D0461 Carcinoma in situ of skin of right upper limb, including shoulder: Secondary | ICD-10-CM | POA: Diagnosis not present

## 2019-09-05 DIAGNOSIS — L570 Actinic keratosis: Secondary | ICD-10-CM | POA: Diagnosis not present

## 2019-09-05 DIAGNOSIS — L905 Scar conditions and fibrosis of skin: Secondary | ICD-10-CM | POA: Diagnosis not present

## 2019-09-05 DIAGNOSIS — C44622 Squamous cell carcinoma of skin of right upper limb, including shoulder: Secondary | ICD-10-CM | POA: Diagnosis not present

## 2019-09-05 DIAGNOSIS — D485 Neoplasm of uncertain behavior of skin: Secondary | ICD-10-CM | POA: Diagnosis not present

## 2019-09-06 DIAGNOSIS — H401112 Primary open-angle glaucoma, right eye, moderate stage: Secondary | ICD-10-CM | POA: Diagnosis not present

## 2019-09-13 ENCOUNTER — Encounter: Payer: Self-pay | Admitting: Radiation Oncology

## 2019-09-13 ENCOUNTER — Other Ambulatory Visit: Payer: Self-pay

## 2019-09-13 NOTE — Progress Notes (Signed)
Patient pre screened for office appointment, no questions or concerns today. Patient reminded of upcoming appointment time and date. 

## 2019-09-14 ENCOUNTER — Other Ambulatory Visit: Payer: Self-pay | Admitting: *Deleted

## 2019-09-14 ENCOUNTER — Encounter: Payer: Self-pay | Admitting: Radiation Oncology

## 2019-09-14 ENCOUNTER — Ambulatory Visit
Admission: RE | Admit: 2019-09-14 | Discharge: 2019-09-14 | Disposition: A | Discharge status: Home / Self Care | Payer: PPO | Source: Ambulatory Visit | Attending: Radiation Oncology | Admitting: Radiation Oncology

## 2019-09-14 ENCOUNTER — Other Ambulatory Visit: Payer: Self-pay

## 2019-09-14 VITALS — BP 152/82 | HR 72 | Temp 98.0°F | Resp 18 | Wt 107.0 lb

## 2019-09-14 DIAGNOSIS — Z923 Personal history of irradiation: Secondary | ICD-10-CM | POA: Insufficient documentation

## 2019-09-14 DIAGNOSIS — C44721 Squamous cell carcinoma of skin of unspecified lower limb, including hip: Secondary | ICD-10-CM | POA: Diagnosis not present

## 2019-09-14 MED ORDER — TRIAMCINOLONE ACETONIDE 0.1 % EX CREA: 1.0000 "application " | TOPICAL_CREAM | Freq: Every day | CUTANEOUS | 11 refills | Status: DC | PRN

## 2019-09-14 NOTE — Progress Notes (Signed)
Radiation Oncology Follow up Note  Name: Joann Warren   Date:   09/14/2019 MRN:  [id number] DOB: 1946-04-19    This 74 y.o. 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  Seen today in routine follow-up she is doing well.  She specifically [place] pain or swelling in her lower extremities.  She does not have any new lesions of note.  She does have changes consistent with prior radiation and some small slightly ulcerated areas of the skin consistent with previously treated areas.  She is no lymphedema in her lower extremities..  COMPLICATIONS OF TREATMENT: none  FOLLOW UP COMPLIANCE: keeps appointments   PHYSICAL EXAM:  BP (!) 152/82   Pulse 72   Temp 98 F (36.7 C)   Resp 18   Wt 107 lb (48.5 kg)   BMI 19.57 kg/m  No evidence of lymphedema in her lower extremities are noted.  No areas of ulceration or any area of new obvious skin cancer is identified.  Well-developed well-nourished patient in NAD. HEENT reveals PERLA, EOMI, discs not visualized.  Oral cavity is clear. No oral mucosal lesions are identified. Neck is clear without evidence of cervical or supraclavicular adenopathy. Lungs are clear to A&P. Cardiac examination is essentially unremarkable with regular rate and rhythm without murmur rub or thrill. Abdomen is benign with no organomegaly or masses noted. Motor sensory and DTR levels are equal and symmetric in the upper and lower extremities. Cranial nerves II through XII are grossly intact. Proprioception is intact. No peripheral adenopathy or edema is identified. No motor or  sensory levels are noted. Crude visual fields are within normal range.  RADIOLOGY RESULTS: No current films to review  PLAN: Present time patient is doing well has an excellent result to electron-beam therapy.  I will see her back in 1 year for follow-up.  She continues close follow-up care with dermatology.  Patient knows to call in any concerns.  I would like to take this opportunity to thank you for allowing me to participate in the care of your patient.Noreene Filbert, MD

## 2019-09-15 ENCOUNTER — Telehealth: Payer: Self-pay

## 2019-09-15 DIAGNOSIS — L578 Other skin changes due to chronic exposure to nonionizing radiation: Secondary | ICD-10-CM | POA: Diagnosis not present

## 2019-09-15 DIAGNOSIS — L570 Actinic keratosis: Secondary | ICD-10-CM | POA: Diagnosis not present

## 2019-09-15 DIAGNOSIS — C44722 Squamous cell carcinoma of skin of right lower limb, including hip: Secondary | ICD-10-CM | POA: Diagnosis not present

## 2019-09-15 DIAGNOSIS — Z85828 Personal history of other malignant neoplasm of skin: Secondary | ICD-10-CM | POA: Diagnosis not present

## 2019-09-15 DIAGNOSIS — C44729 Squamous cell carcinoma of skin of left lower limb, including hip: Secondary | ICD-10-CM | POA: Diagnosis not present

## 2019-09-15 NOTE — Telephone Encounter (Signed)
Send message 

## 2019-09-22 ENCOUNTER — Ambulatory Visit: Payer: PPO | Admitting: Radiation Oncology

## 2019-11-03 ENCOUNTER — Ambulatory Visit: Payer: PPO | Admitting: Adult Health

## 2019-11-28 ENCOUNTER — Ambulatory Visit: Payer: PPO | Admitting: Dermatology

## 2019-11-29 DIAGNOSIS — N183 Chronic kidney disease, stage 3 unspecified: Secondary | ICD-10-CM | POA: Diagnosis not present

## 2019-12-01 ENCOUNTER — Telehealth: Payer: Self-pay

## 2019-12-01 NOTE — Telephone Encounter (Signed)
Called lmom informing patient of appointment on 12/05/2019.klh 

## 2019-12-01 NOTE — Telephone Encounter (Signed)
Confirmed appointment on 12/05/2019 and screened for covid. klh 

## 2019-12-05 ENCOUNTER — Ambulatory Visit (INDEPENDENT_AMBULATORY_CARE_PROVIDER_SITE_OTHER): Payer: PPO | Admitting: Adult Health

## 2019-12-05 ENCOUNTER — Other Ambulatory Visit: Payer: Self-pay

## 2019-12-05 ENCOUNTER — Encounter: Payer: Self-pay | Admitting: Adult Health

## 2019-12-05 VITALS — BP 148/88 | HR 72 | Temp 97.3°F | Resp 16 | Ht 62.0 in | Wt 107.0 lb

## 2019-12-05 DIAGNOSIS — E538 Deficiency of other specified B group vitamins: Secondary | ICD-10-CM | POA: Diagnosis not present

## 2019-12-05 DIAGNOSIS — E785 Hyperlipidemia, unspecified: Secondary | ICD-10-CM

## 2019-12-05 DIAGNOSIS — M810 Age-related osteoporosis without current pathological fracture: Secondary | ICD-10-CM

## 2019-12-05 DIAGNOSIS — I10 Essential (primary) hypertension: Secondary | ICD-10-CM | POA: Diagnosis not present

## 2019-12-05 MED ORDER — AMLODIPINE BESYLATE 5 MG PO TABS: 5.0000 mg | ORAL_TABLET | Freq: Every day | ORAL | 1 refills | Status: DC

## 2019-12-05 MED ORDER — SIMVASTATIN 20 MG PO TABS: 20.0000 mg | ORAL_TABLET | Freq: Every day | ORAL | 1 refills | Status: DC

## 2019-12-05 MED ORDER — CYANOCOBALAMIN 1000 MCG/ML IJ SOLN
1000.0000 ug | Freq: Once | INTRAMUSCULAR | Status: AC
  Administered 2019-12-05: 1000 ug via INTRAMUSCULAR

## 2019-12-05 NOTE — Progress Notes (Signed)
[facility] [place], [place] [address]  Internal MEDICINE  Office Visit Note  Patient Name: Joann Warren  [id number]  AV:[id number]  Date of Service: 12/05/2019  Chief Complaint  Patient presents with  . Hypertension  . Hyperlipidemia    HPI  Pt is here for routine follow up.  She denies any new or worsening issues at this time.  Her blood pressure is well controlled at this time.  She Denies Chest pain, Shortness of breath, palpitations, headache, or blurred vision. She is requesting refills at this time.  Denies any other needs.  She will get her first B12 injection today.    Current Medication: Outpatient Encounter Medications as of 12/05/2019  Medication Sig  . alendronate (FOSAMAX) 70 MG tablet TAKE 1 TABLET EVERY 7 DAYS WITH A FULL GLASS OF WATER ON AN EMPTY STOMACH DO NOT LIE DOWN FOR AT LEAST 30 MIN  . amLODipine (NORVASC) 5 MG tablet Take 1 tablet (5 mg total) by mouth daily.  . bimatoprost (LUMIGAN) 0.03 % ophthalmic solution   . bisoprolol-hydrochlorothiazide (ZIAC) 2.5-6.25 MG tablet TAKE ONE TABLET EVERY MORNING  . donepezil (ARICEPT) 5 MG tablet Take 1 tablet (5 mg total) by mouth at bedtime. For memory  . hydrOXYzine (ATARAX/VISTARIL) 10 MG tablet Take 10 mg by mouth 3 (three) times daily as needed.  Marland Kitchen ibuprofen (ADVIL,MOTRIN) 600 MG tablet Take 1 tablet (600 mg total) by mouth every 6 (six) hours as needed.  . mupirocin ointment (BACTROBAN) 2 %   . simvastatin (ZOCOR) 20 MG tablet Take 1 tablet (20 mg total) by mouth at bedtime.  . triamcinolone cream (KENALOG) 0.1 % Apply 1 application topically daily as needed.  . [DISCONTINUED] amLODipine (NORVASC) 5 MG tablet Take 1 tablet (5 mg total) by mouth daily.  . [DISCONTINUED] simvastatin (ZOCOR) 20 MG tablet Take 1 tablet (20 mg total) by mouth at bedtime.  . [EXPIRED] cyanocobalamin ((VITAMIN B-12)) injection 1,000 mcg    No facility-administered encounter medications on file as of  12/05/2019.    Surgical History: Past Surgical History:  Procedure Laterality Date  . BREAST EXCISIONAL BIOPSY Left 1972   neg  . BREAST EXCISIONAL BIOPSY Right 1980   neg  . COLONOSCOPY    . ESOPHAGOGASTRODUODENOSCOPY (EGD) WITH PROPOFOL N/A 04/09/2015   Procedure: ESOPHAGOGASTRODUODENOSCOPY (EGD) WITH PROPOFOL;  Surgeon: Manya Silvas, MD;  Location: [facility] ENDOSCOPY;  Service: Endoscopy;  Laterality: N/A;  . skin cancer removal      Medical History: Past Medical History:  Diagnosis Date  . Actinic keratosis   . Anemia   . Basal cell carcinoma 12/23/2007   Upper back.  . Basal cell carcinoma 05/17/2009   Right med lower leg  . Basal cell carcinoma 07/06/2014   Left medial breast  . Basal cell carcinoma 03/01/2015   Right medial mid pretibial  . Basal cell carcinoma 07/16/2015   Right superior breast  . Basal cell carcinoma 05/21/2016   Left inferior medial breast  . Basal cell carcinoma 02/08/2018   Left medial breast  . Basal cell carcinoma 11/01/2018   Left medial breast  . Basal cell carcinoma 03/30/2019   Left med inf breast  . Basal cell carcinoma 09/05/2019   Left proximal bicep  . Cancer (HCC)    squamous cell  . Glaucoma   . Hyperlipidemia   . Hypertension   . Osteoporosis   . Squamous cell carcinoma of skin 12/08/2007   Left lower leg. WD SCC  . Squamous  cell carcinoma of skin 02/01/2008   Left distal med. pretibial. WD SCC  . Squamous cell carcinoma of skin 02/01/2008   Right mid pretibial. WD SCC  . Squamous cell carcinoma of skin 02/01/2008   Right mid pretibal inferior. WD SCC with superficial infiltration.  . Squamous cell carcinoma of skin 04/05/2008   Left dorsum hand. WD SCC with superficial infiltration.  . Squamous cell carcinoma of skin 06/15/2008   Right lower leg inferior. WD SCC  . Squamous cell carcinoma of skin 06/15/2008   Right lower leg superior. SCCis hypertrophic  . Squamous cell carcinoma of skin 07/05/2008   Right lat.  lower leg, Right lower leg, Left lower leg  . Squamous cell carcinoma of skin 08/04/2008   Right ant. lower leg, Left med. lower leg  . Squamous cell carcinoma of skin 09/01/2008   Right post. lower leg  . Squamous cell carcinoma of skin 10/04/2008   Left post leg  . Squamous cell carcinoma of skin 12/01/2008   Left lower leg  . Squamous cell carcinoma of skin 03/08/2009   Right ant. mid lower leg, Right lower leg  . Squamous cell carcinoma of skin 05/17/2009   Left lower post. leg superior  . Squamous cell carcinoma of skin 10/11/2009   Left lower leg. KA-pattern  . Squamous cell carcinoma of skin 03/28/2010   Right lower leg  . Squamous cell carcinoma of skin 03/25/2013   Right lower leg  . Squamous cell carcinoma of skin 06/23/2013   Right post. lower leg. Right medial lower leg. Left medial lower leg  . Squamous cell carcinoma of skin 09/09/2013   Left anterior upper arm. Right anterior lower leg. Right lat. lower leg. Right below knee lower leg  . Squamous cell carcinoma of skin 12/08/2013   Right posterior lower leg sup. Right forearm  . Squamous cell carcinoma of skin 03/03/2014   Right posterior leg. Right med. lower leg  . Squamous cell carcinoma of skin 06/22/2014   Right to of shoulder anterior. Right top of shoulder posterior  . Squamous cell carcinoma of skin 08/03/2014   Right anterior forearm  . Squamous cell carcinoma of skin 09/14/2014   Right chest  . Squamous cell carcinoma of skin 10/30/2014   Right calf  . Squamous cell carcinoma of skin 12/14/2014   Right med. distal pretibial sup. Right med distal pretibial inf. Left proximal bicep sup. Left proximal bicep inf.  . Squamous cell carcinoma of skin 03/01/2015   Left lat proximal pretibial near knee  . Squamous cell carcinoma of skin 03/29/2015   Left mid to prox. pretibial. Left mid. lat. pretibial.   . Squamous cell carcinoma of skin 04/19/2015   Right mid calf. Right distal lat. pretibial  . Squamous  cell carcinoma of skin 05/31/2015   Right proximal med. pretibial ant. Right proximal med. pretibial posterior  . Squamous cell carcinoma of skin 07/16/2015   Right upper arm inf. deltoid  . Squamous cell carcinoma of skin 08/06/2015   Left distal pretibial  . Squamous cell carcinoma of skin 09/12/2015   Left postauricular area  . Squamous cell carcinoma of skin 12/12/2015   Left proximal pretibial x4 sites  . Squamous cell carcinoma of skin 12/31/2015   Right med. lower leg above ankle sup. Right med. lower leg above ankle inf. Left inf. lat. calf med. Left inf. calf lat.  . Squamous cell carcinoma of skin 01/31/2016   Right med. sup. ankle area. Right med mid proximal calf.  Left dorsum foot.   . Squamous cell carcinoma of skin 03/17/2016   Left post. distal calf lateral. Left post distal calf med. Left mid lat ant. thigh. Right mid lat. calf  . Squamous cell carcinoma of skin 05/21/2016   Right proximal medial pretibial. Right mid med. pretibial.  . Squamous cell carcinoma of skin 07/03/2016   Right mid med. pretibial. Right proximal med. pretibial  . Squamous cell carcinoma of skin 08/20/2016   Right medial calf. Left distal pretibial. Right upper back paraspinal  . Squamous cell carcinoma of skin 10/30/2016   Right medial calf x2  . Squamous cell carcinoma of skin 12/10/2016   left mid back 6.0cm lat to spine. Left sup. lat. calf. Left inf. post. calf  . Squamous cell carcinoma of skin 12/25/2016   Right medial above ankle. Right mid pretibial. Right prox. pretibial. Right prox. pretibial med.   . Squamous cell carcinoma of skin 01/19/2017   Left forearm. Left ant. neck  . Squamous cell carcinoma of skin 03/23/2017   Left med. pretibial below knee  . Squamous cell carcinoma of skin 04/13/2017   Right lat. sup. ankle. Right medial ankle  . Squamous cell carcinoma of skin 04/27/2017   Left med. distal prox. calf. Left medial distal calf. Left lat. distal prox. calf. Left lat  distal distal calf  . Squamous cell carcinoma of skin 05/14/2017   Left bicep. Left dorsum mid forearm  . Squamous cell carcinoma of skin 06/25/2017   Right med. distal calf x3  . Squamous cell carcinoma of skin 09/02/2017   Left med. infraclavicular  . Squamous cell carcinoma of skin 12/21/2017   Left prox. med. lower leg. Right forearm. Left forearm. Left bicep  . Squamous cell carcinoma of skin 01/07/2018   Left dorsum foot prox. Left dorsum foot distal. Left mid to distal calf. Right chest  . Squamous cell carcinoma of skin 03/08/2018   Left bicep. Right proximal dorsum forearm. Right distal med. pretibial sup. Right distal med pretibial inf.   . Squamous cell carcinoma of skin 04/05/2018   Right sup. med scapula  . Squamous cell carcinoma of skin 06/03/2018   Left med. ankle sup. Left med. ankle inf. Left lateral ankle.  Marland Kitchen Squamous cell carcinoma of skin 09/01/2018   Right mid lat. calf. Right lat. knee. Prox post calf  . Squamous cell carcinoma of skin 11/01/2018   Left lateral ankle  . Squamous cell carcinoma of skin 12/08/2018   Right lateral ankle lat malleolus  . Squamous cell carcinoma of skin 01/05/2019   Right lateral elbow. Left mid medial calf. Left prox. ant. thigh. Right distal lat tricep  . Squamous cell carcinoma of skin 01/19/2019   Right popliteal  . Squamous cell carcinoma of skin 02/03/2019   Left mid lat calf. Left mid calf. Left dorsum lat distal foot. Left dorsum distal foot  . Squamous cell carcinoma of skin 03/30/2019   Right lower leg above med ankle ant. Right lower leg above ankle sup. Right lower leg above the med ankle inf.  . Squamous cell carcinoma of skin 05/12/2019   Right chest medial. Left deltoid  . Squamous cell carcinoma of skin 06/23/2019   Right ant thigh distal above knee. Left med distal thigh. Left ant thigh above knee  . Squamous cell carcinoma of skin 07/20/2019   Right chest. Right dorsal hand.  . Squamous cell carcinoma of skin  08/03/2019   Left distal lat pretibial. Left distal med calf. Right prox.  lat calf  . Squamous cell carcinoma of skin 09/05/2019   Right middle bicep. Right sup lat bicep. Right sup mid bicep. Right sup med bicep  . Squamous cell carcinoma of skin 09/15/2019   Right proximal med pretibial. Left distal lat thigh    Family History: Family History  Problem Relation Age of Onset  . Breast cancer Sister 73  . Atrial fibrillation Mother     Social History   Socioeconomic History  . Marital status: Married    Spouse name: Not on file  . Number of children: Not on file  . Years of education: Not on file  . Highest education level: Not on file  Occupational History  . Not on file  Tobacco Use  . Smoking status: Never Smoker  . Smokeless tobacco: Never Used  Substance and Sexual Activity  . Alcohol use: No  . Drug use: No  . Sexual activity: Not on file  Other Topics Concern  . Not on file  Social History Narrative  . Not on file   Social Determinants of Health   Financial Resource Strain:   . Difficulty of Paying Living Expenses:   Food Insecurity:   . Worried About Charity fundraiser in the Last Year:   . Arboriculturist in the Last Year:   Transportation Needs:   . Film/video editor (Medical):   Marland Kitchen Lack of Transportation (Non-Medical):   Physical Activity:   . Days of Exercise per Week:   . Minutes of Exercise per Session:   Stress:   . Feeling of Stress :   Social Connections:   . Frequency of Communication with Friends and Family:   . Frequency of Social Gatherings with Friends and Family:   . Attends Religious Services:   . Active Member of Clubs or Organizations:   . Attends Archivist Meetings:   Marland Kitchen Marital Status:   Intimate Partner Violence:   . Fear of Current or Ex-Partner:   . Emotionally Abused:   Marland Kitchen Physically Abused:   . Sexually Abused:       Review of Systems  Constitutional: Negative for chills, fatigue and unexpected weight  change.  HENT: Negative for congestion, rhinorrhea, sneezing and sore throat.   Eyes: Negative for photophobia, pain and redness.  Respiratory: Negative for cough, chest tightness and shortness of breath.   Cardiovascular: Negative for chest pain and palpitations.  Gastrointestinal: Negative for abdominal pain, constipation, diarrhea, nausea and vomiting.  Endocrine: Negative.   Genitourinary: Negative for dysuria and frequency.  Musculoskeletal: Negative for arthralgias, back pain, joint swelling and neck pain.  Skin: Negative for rash.  Allergic/Immunologic: Negative.   Neurological: Negative for tremors and numbness.  Hematological: Negative for adenopathy. Does not bruise/bleed easily.  Psychiatric/Behavioral: Negative for behavioral problems and sleep disturbance. The patient is not nervous/anxious.     Vital Signs: BP (!) 148/88   Pulse 72   Temp (!) 97.3 F (36.3 C)   Resp 16   Ht 5\' 2"  (1.575 m)   Wt 107 lb (48.5 kg)   SpO2 98%   BMI 19.57 kg/m    Physical Exam Vitals and nursing note reviewed.  Constitutional:      General: She is not in acute distress.    Appearance: She is well-developed. She is not diaphoretic.  HENT:     Head: Normocephalic and atraumatic.     Mouth/Throat:     Pharynx: No oropharyngeal exudate.  Eyes:     Pupils:  Pupils are equal, round, and reactive to light.  Neck:     Thyroid: No thyromegaly.     Vascular: No JVD.     Trachea: No tracheal deviation.  Cardiovascular:     Rate and Rhythm: Normal rate and regular rhythm.     Heart sounds: Normal heart sounds. No murmur. No friction rub. No gallop.   Pulmonary:     Effort: Pulmonary effort is normal. No respiratory distress.     Breath sounds: Normal breath sounds. No wheezing or rales.  Chest:     Chest wall: No tenderness.  Abdominal:     Palpations: Abdomen is soft.     Tenderness: There is no abdominal tenderness. There is no guarding.  Musculoskeletal:        General: Normal  range of motion.     Cervical back: Normal range of motion and neck supple.  Lymphadenopathy:     Cervical: No cervical adenopathy.  Skin:    General: Skin is warm and dry.  Neurological:     Mental Status: She is alert and oriented to person, place, and time.     Cranial Nerves: No cranial nerve deficit.  Psychiatric:        Behavior: Behavior normal.        Thought Content: Thought content normal.        Judgment: Judgment normal.     Assessment/Plan: 1. B12 deficiency First B12 injection.   - cyanocobalamin ((VITAMIN B-12)) injection 1,000 mcg  2. Hypertension, unspecified type Continue Norvasc as directed.  Refill sent. - amLODipine (NORVASC) 5 MG tablet; Take 1 tablet (5 mg total) by mouth daily.  Dispense: 90 tablet; Refill: 1  3. Hyperlipidemia, unspecified hyperlipidemia type Continue Zocor. - simvastatin (ZOCOR) 20 MG tablet; Take 1 tablet (20 mg total) by mouth at bedtime.  Dispense: 90 tablet; Refill: 1  4. Senile osteoporosis Continue fosamax.   General Counseling: Aryiah verbalizes understanding of the findings of todays visit and agrees with plan of treatment. I have discussed any further diagnostic evaluation that may be needed or ordered today. We also reviewed her medications today. she has been encouraged to call the office with any questions or concerns that should arise related to todays visit.    No orders of the defined types were placed in this encounter.   Meds ordered this encounter  Medications  . cyanocobalamin ((VITAMIN B-12)) injection 1,000 mcg  . simvastatin (ZOCOR) 20 MG tablet    Sig: Take 1 tablet (20 mg total) by mouth at bedtime.    Dispense:  90 tablet    Refill:  1    FOR NEXT FILL, THANK YOU  . amLODipine (NORVASC) 5 MG tablet    Sig: Take 1 tablet (5 mg total) by mouth daily.    Dispense:  90 tablet    Refill:  1    FOR NEXT FILL, THANK YOU    Time spent: 30 Minutes   This patient was seen by Orson Gear AGNP-C in  Collaboration with Dr Lavera Guise as a part of collaborative care agreement     Kendell Bane AGNP-C Internal medicine

## 2019-12-08 ENCOUNTER — Encounter: Payer: Self-pay | Admitting: Dermatology

## 2019-12-08 ENCOUNTER — Ambulatory Visit: Payer: PPO | Admitting: Dermatology

## 2019-12-08 ENCOUNTER — Other Ambulatory Visit: Payer: Self-pay

## 2019-12-08 DIAGNOSIS — D492 Neoplasm of unspecified behavior of bone, soft tissue, and skin: Secondary | ICD-10-CM

## 2019-12-08 DIAGNOSIS — C44319 Basal cell carcinoma of skin of other parts of face: Secondary | ICD-10-CM

## 2019-12-08 DIAGNOSIS — L578 Other skin changes due to chronic exposure to nonionizing radiation: Secondary | ICD-10-CM | POA: Diagnosis not present

## 2019-12-08 DIAGNOSIS — Z85828 Personal history of other malignant neoplasm of skin: Secondary | ICD-10-CM

## 2019-12-08 NOTE — Patient Instructions (Signed)

## 2019-12-08 NOTE — Progress Notes (Signed)
   Follow-Up Visit   Subjective  Joann Warren is a 74 y.o. female who presents for the following: skin cancer (Pt c/o growth on her Right forehead recently appeared could be skin cancer, growing, bleeding ). The patient has history of severe sun damaged changes and multiple multiple basal cell carcinomas and squamous cell carcinomas.  The following portions of the chart were reviewed this encounter and updated as appropriate:  Tobacco  Allergies  Meds  Problems  Med Hx  Surg Hx  Fam Hx      Review of Systems:  No other skin or systemic complaints except as noted in HPI or Assessment and Plan.  Objective  Well appearing patient in no apparent distress; mood and affect are within normal limits.  A focused examination was performed including face. Relevant physical exam findings are noted in the Assessment and Plan.  Objective  R forehead: 1.5 cm crusted ulcer      Assessment & Plan  Neoplasm of skin R forehead  Skin / nail biopsy Type of biopsy: tangential   Informed consent: discussed and consent obtained   Patient was prepped and draped in usual sterile fashion: area prepped with alochol. Anesthesia: the lesion was anesthetized in a standard fashion   Anesthetic:  1% lidocaine w/ epinephrine 1-100,000 buffered w/ 8.4% NaHCO3 Instrument used: flexible razor blade   Hemostasis achieved with: pressure, aluminum chloride and electrodesiccation   Outcome: patient tolerated procedure well   Post-procedure details: wound care instructions given   Post-procedure details comment:  Ointment and small bandage  Specimen 1 - Surgical pathology Differential Diagnosis: R/O BCC Check Margins: No 1.5 cm crusted papule  Consider Mohs if pathology confirms skin cancer  Return if symptoms worsen or fail to improve.   Actinic Damage - diffuse scaly erythematous macules with underlying dyspigmentation - Recommend daily broad spectrum sunscreen SPF 30+ to sun-exposed areas,  reapply every 2 hours as needed.  - Call for new or changing lesions.  IMarye Round, CMA, am acting as scribe for Sarina Ser, MD .  Documentation: I have reviewed the above documentation for accuracy and completeness, and I agree with the above.  Sarina Ser, MD

## 2019-12-09 ENCOUNTER — Encounter: Payer: Self-pay | Admitting: Dermatology

## 2019-12-12 ENCOUNTER — Telehealth: Payer: Self-pay

## 2019-12-12 NOTE — Telephone Encounter (Signed)
Patient informed of results and appointment scheduled. 

## 2019-12-13 ENCOUNTER — Ambulatory Visit: Payer: PPO | Admitting: Dermatology

## 2019-12-13 ENCOUNTER — Other Ambulatory Visit: Payer: Self-pay

## 2019-12-13 ENCOUNTER — Encounter: Payer: Self-pay | Admitting: Dermatology

## 2019-12-13 DIAGNOSIS — Z85828 Personal history of other malignant neoplasm of skin: Secondary | ICD-10-CM

## 2019-12-13 DIAGNOSIS — C44511 Basal cell carcinoma of skin of breast: Secondary | ICD-10-CM | POA: Diagnosis not present

## 2019-12-13 DIAGNOSIS — L570 Actinic keratosis: Secondary | ICD-10-CM | POA: Diagnosis not present

## 2019-12-13 DIAGNOSIS — C44519 Basal cell carcinoma of skin of other part of trunk: Secondary | ICD-10-CM

## 2019-12-13 DIAGNOSIS — C44319 Basal cell carcinoma of skin of other parts of face: Secondary | ICD-10-CM | POA: Diagnosis not present

## 2019-12-13 DIAGNOSIS — D485 Neoplasm of uncertain behavior of skin: Secondary | ICD-10-CM

## 2019-12-13 NOTE — Patient Instructions (Signed)

## 2019-12-13 NOTE — Progress Notes (Signed)
Follow-Up Visit   Subjective  Joann Warren is a 74 y.o. female who presents for the following: Basal Cell Carcinoma (Biopsy proven of right forehead - here for [facility] treatment today.) and Other (Spots on left chest in site of previous SCC. Just small scabs right now.).  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  She has had XRT on legs in past with some better control.  We discussed considering SCC chemotherapy if she is a candidate.  The following portions of the chart were reviewed this encounter and updated as appropriate:  Tobacco  Allergies  Meds  Problems  Med Hx  Surg Hx  Fam Hx      Review of Systems:  No other skin or systemic complaints except as noted in HPI or Assessment and Plan.  Objective  Well appearing patient in no apparent distress; mood and affect are within normal limits.  A focused examination was performed including face. Relevant physical exam findings are noted in the Assessment and Plan.  Objective  Chest (8), Face (9): Erythematous thin papules/macules with gritty scale.   Objective  Right Forehead: Pink pearly papule or plaque with arborizing vessels.   Objective  Legs: Well healed scar with no evidence of recurrence, no lymphadenopathy.   Objective  Left medial breast inf: 2.1 cm crusted patch  Objective  Left medial breast sup: 3.1 cm crusted patch   Assessment & Plan    Actinic Damage (severe) - diffuse scaly erythematous macules with underlying dyspigmentation - Recommend daily broad spectrum sunscreen SPF 30+ to sun-exposed areas, reapply every 2 hours as needed.  - Call for new or changing lesions.   AK (actinic keratosis) (17) Face (9); Chest (8)  Destruction of lesion - Face Complexity: simple   Destruction method: cryotherapy   Informed consent: discussed and consent obtained   Timeout:  patient name, date of birth, surgical site, and procedure  verified Lesion destroyed using liquid nitrogen: Yes   Region frozen until ice ball extended beyond lesion: Yes   Outcome: patient tolerated procedure well with no complications   Post-procedure details: wound care instructions given    Basal cell carcinoma (BCC) of skin of other part of face Right Forehead  Destruction of lesion Complexity: extensive   Destruction method: electrodesiccation and curettage   Destruction method comment:  Shave removal today followed by [facility] Informed consent: discussed and consent obtained   Timeout:  patient name, date of birth, surgical site, and procedure verified Procedure prep:  Patient was prepped and draped in usual sterile fashion Prep type:  Isopropyl alcohol Anesthesia: the lesion was anesthetized in a standard fashion   Anesthetic:  1% lidocaine w/ epinephrine 1-100,000 buffered w/ 8.4% NaHCO3 Curettage performed in three different directions: Yes   Electrodesiccation performed over the curetted area: Yes   Lesion length (cm):  3.1 Lesion width (cm):  1.4 Margin per side (cm):  0.2 Final wound size (cm):  3.5 Hemostasis achieved with:  pressure, aluminum chloride and electrodesiccation Outcome: patient tolerated procedure well with no complications   Post-procedure details: sterile dressing applied and wound care instructions given   Dressing type: bandage and petrolatum    Biopsy proven BCC  History of SCC (squamous cell carcinoma) of skin Legs  History of multiple SCCs treated with EDC and radiation in the past. Will discuss new chemotherapy treatment for [facility] with Oncology. Will plan Oncology referral for possible SCC chemo.  Neoplasm of uncertain  behavior of skin (2) Left medial breast inf  Epidermal / dermal shaving  Lesion length (cm):  2.1 Lesion width (cm):  2.1 Margin per side (cm):  0.2 Total excision diameter (cm):  2.5 Informed consent: discussed and consent obtained   Timeout: patient name, date of birth, surgical  site, and procedure verified   Procedure prep:  Patient was prepped and draped in usual sterile fashion Prep type:  Isopropyl alcohol Anesthesia: the lesion was anesthetized in a standard fashion   Anesthetic:  1% lidocaine w/ epinephrine 1-100,000 buffered w/ 8.4% NaHCO3 Instrument used: flexible razor blade   Hemostasis achieved with: pressure, aluminum chloride and electrodesiccation   Outcome: patient tolerated procedure well   Post-procedure details: sterile dressing applied and wound care instructions given   Dressing type: bandage and petrolatum    Destruction of lesion Complexity: extensive   Destruction method: electrodesiccation and curettage   Informed consent: discussed and consent obtained   Timeout:  patient name, date of birth, surgical site, and procedure verified Procedure prep:  Patient was prepped and draped in usual sterile fashion Prep type:  Isopropyl alcohol Anesthesia: the lesion was anesthetized in a standard fashion   Anesthetic:  1% lidocaine w/ epinephrine 1-100,000 buffered w/ 8.4% NaHCO3 Curettage performed in three different directions: Yes   Electrodesiccation performed over the curetted area: Yes   Hemostasis achieved with:  pressure, aluminum chloride and electrodesiccation Outcome: patient tolerated procedure well with no complications   Post-procedure details: sterile dressing applied and wound care instructions given   Dressing type: bandage and petrolatum    Specimen 1 - Surgical pathology Differential Diagnosis: SCC vs other  Check Margins: No 2.1 cm crusted patch EDC today  Left medial breast sup  Epidermal / dermal shaving  Lesion length (cm):  3.1 Lesion width (cm):  3.1 Margin per side (cm):  0.2 Total excision diameter (cm):  3.5 Informed consent: discussed and consent obtained   Timeout: patient name, date of birth, surgical site, and procedure verified   Procedure prep:  Patient was prepped and draped in usual sterile  fashion Prep type:  Isopropyl alcohol Anesthesia: the lesion was anesthetized in a standard fashion   Anesthetic:  0.5% bupivicaine w/ epinephrine 1-100,000 local infiltration Instrument used: flexible razor blade   Hemostasis achieved with: pressure, aluminum chloride and electrodesiccation   Outcome: patient tolerated procedure well   Post-procedure details: sterile dressing applied and wound care instructions given   Dressing type: bandage and petrolatum    Destruction of lesion Complexity: extensive   Destruction method: electrodesiccation and curettage   Informed consent: discussed and consent obtained   Timeout:  patient name, date of birth, surgical site, and procedure verified Procedure prep:  Patient was prepped and draped in usual sterile fashion Prep type:  Isopropyl alcohol Anesthesia: the lesion was anesthetized in a standard fashion   Anesthetic:  1% lidocaine w/ epinephrine 1-100,000 buffered w/ 8.4% NaHCO3 Curettage performed in three different directions: Yes   Electrodesiccation performed over the curetted area: Yes   Hemostasis achieved with:  pressure, aluminum chloride and electrodesiccation Outcome: patient tolerated procedure well with no complications   Post-procedure details: sterile dressing applied and wound care instructions given   Dressing type: bandage and petrolatum    Specimen 2 - Surgical pathology Differential Diagnosis: SCC vs other  Check Margins: No 3.1 cm crusted patch EDC today  Return in about 3 months (around 03/13/2020).  I, Ashok Cordia, CMA, am acting as scribe for Sarina Ser, MD .  Documentation: I have reviewed the above documentation for accuracy and completeness, and I agree with the above.  Sarina Ser, MD

## 2019-12-15 ENCOUNTER — Telehealth: Payer: Self-pay

## 2019-12-15 NOTE — Telephone Encounter (Signed)
Patient informed of biopsy result, verbalized understanding .

## 2019-12-22 DIAGNOSIS — H169 Unspecified keratitis: Secondary | ICD-10-CM | POA: Diagnosis not present

## 2019-12-27 DIAGNOSIS — H43812 Vitreous degeneration, left eye: Secondary | ICD-10-CM | POA: Diagnosis not present

## 2020-01-04 ENCOUNTER — Ambulatory Visit (INDEPENDENT_AMBULATORY_CARE_PROVIDER_SITE_OTHER): Payer: PPO

## 2020-01-04 ENCOUNTER — Other Ambulatory Visit: Payer: Self-pay

## 2020-01-04 DIAGNOSIS — E538 Deficiency of other specified B group vitamins: Secondary | ICD-10-CM | POA: Diagnosis not present

## 2020-01-04 MED ORDER — CYANOCOBALAMIN 1000 MCG/ML IJ SOLN
1000.0000 ug | Freq: Once | INTRAMUSCULAR | Status: AC
  Administered 2020-01-04: 1000 ug via INTRAMUSCULAR

## 2020-01-24 ENCOUNTER — Ambulatory Visit: Payer: PPO | Admitting: Dermatology

## 2020-02-01 ENCOUNTER — Telehealth: Payer: Self-pay

## 2020-02-01 NOTE — Telephone Encounter (Signed)
Fax from [facility] requesting RFs of Hydroxyzine. Ok to RF?

## 2020-02-01 NOTE — Telephone Encounter (Signed)
1 refill OK for Hydroxyzine

## 2020-02-02 MED ORDER — HYDROXYZINE HCL 10 MG PO TABS: 10.0000 mg | ORAL_TABLET | ORAL | 0 refills | Status: AC

## 2020-02-02 NOTE — Telephone Encounter (Signed)
Refill escribed to [facility].

## 2020-02-06 ENCOUNTER — Ambulatory Visit: Payer: PPO

## 2020-02-07 ENCOUNTER — Other Ambulatory Visit: Payer: Self-pay

## 2020-02-07 ENCOUNTER — Ambulatory Visit (INDEPENDENT_AMBULATORY_CARE_PROVIDER_SITE_OTHER): Payer: PPO

## 2020-02-07 DIAGNOSIS — E538 Deficiency of other specified B group vitamins: Secondary | ICD-10-CM

## 2020-02-07 MED ORDER — CYANOCOBALAMIN 1000 MCG/ML IJ SOLN
1000.0000 ug | Freq: Once | INTRAMUSCULAR | Status: AC
  Administered 2020-02-07: 1000 ug via INTRAMUSCULAR

## 2020-02-29 ENCOUNTER — Other Ambulatory Visit: Payer: Self-pay | Admitting: Internal Medicine

## 2020-02-29 DIAGNOSIS — Z1231 Encounter for screening mammogram for malignant neoplasm of breast: Secondary | ICD-10-CM

## 2020-03-05 DIAGNOSIS — H401112 Primary open-angle glaucoma, right eye, moderate stage: Secondary | ICD-10-CM | POA: Diagnosis not present

## 2020-03-08 ENCOUNTER — Ambulatory Visit (INDEPENDENT_AMBULATORY_CARE_PROVIDER_SITE_OTHER): Payer: PPO

## 2020-03-08 ENCOUNTER — Other Ambulatory Visit: Payer: Self-pay

## 2020-03-08 DIAGNOSIS — E538 Deficiency of other specified B group vitamins: Secondary | ICD-10-CM | POA: Diagnosis not present

## 2020-03-08 MED ORDER — CYANOCOBALAMIN 1000 MCG/ML IJ SOLN
1000.0000 ug | Freq: Once | INTRAMUSCULAR | Status: AC
  Administered 2020-03-08: 1000 ug via INTRAMUSCULAR

## 2020-03-15 ENCOUNTER — Other Ambulatory Visit: Payer: Self-pay

## 2020-03-15 ENCOUNTER — Ambulatory Visit: Payer: PPO | Admitting: Dermatology

## 2020-03-15 DIAGNOSIS — L578 Other skin changes due to chronic exposure to nonionizing radiation: Secondary | ICD-10-CM

## 2020-03-15 DIAGNOSIS — C44621 Squamous cell carcinoma of skin of unspecified upper limb, including shoulder: Secondary | ICD-10-CM

## 2020-03-15 DIAGNOSIS — Z85828 Personal history of other malignant neoplasm of skin: Secondary | ICD-10-CM

## 2020-03-15 DIAGNOSIS — C44629 Squamous cell carcinoma of skin of left upper limb, including shoulder: Secondary | ICD-10-CM | POA: Diagnosis not present

## 2020-03-15 DIAGNOSIS — L570 Actinic keratosis: Secondary | ICD-10-CM

## 2020-03-15 DIAGNOSIS — D485 Neoplasm of uncertain behavior of skin: Secondary | ICD-10-CM

## 2020-03-15 NOTE — Patient Instructions (Signed)
Cryotherapy Aftercare  . Wash gently with soap and water everyday.   . Apply Vaseline and Band-Aid daily until healed.  Wound Care Instructions  1. Cleanse wound gently with soap and water once a day then pat dry with clean gauze. Apply a thing coat of Petrolatum (petroleum jelly, "Vaseline") over the wound (unless you have an allergy to this). We recommend that you use a new, sterile tube of Vaseline. Do not pick or remove scabs. Do not remove the yellow or white "healing tissue" from the base of the wound.  2. Cover the wound with fresh, clean, nonstick gauze and secure with paper tape. You may use Band-Aids in place of gauze and tape if the would is small enough, but would recommend trimming much of the tape off as there is often too much. Sometimes Band-Aids can irritate the skin.  3. You should call the office for your biopsy report after 1 week if you have not already been contacted.  4. If you experience any problems, such as abnormal amounts of bleeding, swelling, significant bruising, significant pain, or evidence of infection, please call the office immediately.  5. FOR ADULT SURGERY PATIENTS: If you need something for pain relief you may take 1 extra strength Tylenol (acetaminophen) AND 2 Ibuprofen (200mg each) together every 4 hours as needed for pain. (do not take these if you are allergic to them or if you have a reason you should not take them.) Typically, you may only need pain medication for 1 to 3 days.     

## 2020-03-15 NOTE — Progress Notes (Signed)
Follow-Up Visit   Subjective  Joann Warren is a 74 y.o. female who presents for the following: Follow-up. Patient here today for 3 month follow up. She does have some new areas at both arms. Has a history of BCC, SCC.  The following portions of the chart were reviewed this encounter and updated as appropriate:  Tobacco  Allergies  Meds  Problems  Med Hx  Surg Hx  Fam Hx     Review of Systems:  No other skin or systemic complaints except as noted in HPI or Assessment and Plan.  Objective  Well appearing patient in no apparent distress; mood and affect are within normal limits.  A focused examination was performed including arms. Relevant physical exam findings are noted in the Assessment and Plan.  Objective  Arms x 19 (19): Erythematous thin papules/macules with gritty scale.   Objective  Left Anterior Shoulder: 1.0cm hyperkeratotic papule  Objective  Left Lateral Elbow: 1.1cm hyperkeratotic papule  Objective  Left Distal Lateral Tricep: 1.3cm hyperkeratotic papule  Objective  Left Mid Dorsum Medial Forearm: 1.3cm hyperkeratotic papule   Assessment & Plan  AK (actinic keratosis) (19) Arms x 19  Destruction of lesion - Arms x 19 Complexity: simple   Destruction method: cryotherapy   Informed consent: discussed and consent obtained   Timeout:  patient name, date of birth, surgical site, and procedure verified Lesion destroyed using liquid nitrogen: Yes   Region frozen until ice ball extended beyond lesion: Yes   Outcome: patient tolerated procedure well with no complications   Post-procedure details: wound care instructions given    Neoplasm of uncertain behavior of skin (4) Left Anterior Shoulder  Epidermal / dermal shaving  Lesion diameter (cm):  1 Informed consent: discussed and consent obtained   Timeout: patient name, date of birth, surgical site, and procedure verified   Procedure prep:  Patient was prepped and draped in usual sterile  fashion Prep type:  Isopropyl alcohol Anesthesia: the lesion was anesthetized in a standard fashion   Anesthetic:  1% lidocaine w/ epinephrine 1-100,000 buffered w/ 8.4% NaHCO3 Instrument used: flexible razor blade   Hemostasis achieved with: pressure, aluminum chloride and electrodesiccation   Outcome: patient tolerated procedure well   Post-procedure details: sterile dressing applied and wound care instructions given   Dressing type: bandage and petrolatum    Destruction of lesion Complexity: simple   Destruction method: electrodesiccation and curettage   Informed consent: discussed and consent obtained   Timeout:  patient name, date of birth, surgical site, and procedure verified Procedure prep:  Patient was prepped and draped in usual sterile fashion Prep type:  Isopropyl alcohol Anesthesia: the lesion was anesthetized in a standard fashion   Anesthetic:  1% lidocaine w/ epinephrine 1-100,000 buffered w/ 8.4% NaHCO3 Curettage performed in three different directions: Yes   Electrodesiccation performed over the curetted area: Yes   Lesion length (cm):  1 Lesion width (cm):  1 Margin per side (cm):  0.2 Final wound size (cm):  1.4 Hemostasis achieved with:  pressure, aluminum chloride and electrodesiccation Outcome: patient tolerated procedure well with no complications   Post-procedure details: sterile dressing applied and wound care instructions given   Dressing type: bandage and petrolatum    Left Lateral Elbow  Epidermal / dermal shaving  Lesion diameter (cm):  1.1 Informed consent: discussed and consent obtained   Timeout: patient name, date of birth, surgical site, and procedure verified   Procedure prep:  Patient was prepped and draped in usual sterile  fashion Prep type:  Isopropyl alcohol Anesthesia: the lesion was anesthetized in a standard fashion   Anesthetic:  1% lidocaine w/ epinephrine 1-100,000 buffered w/ 8.4% NaHCO3 Instrument used: flexible razor blade     Hemostasis achieved with: pressure, aluminum chloride and electrodesiccation   Outcome: patient tolerated procedure well   Post-procedure details: sterile dressing applied and wound care instructions given   Dressing type: bandage and petrolatum    Destruction of lesion Complexity: extensive   Destruction method: electrodesiccation and curettage   Informed consent: discussed and consent obtained   Timeout:  patient name, date of birth, surgical site, and procedure verified Procedure prep:  Patient was prepped and draped in usual sterile fashion Prep type:  Isopropyl alcohol Anesthesia: the lesion was anesthetized in a standard fashion   Anesthetic:  1% lidocaine w/ epinephrine 1-100,000 buffered w/ 8.4% NaHCO3 Curettage performed in three different directions: Yes   Electrodesiccation performed over the curetted area: Yes   Lesion length (cm):  1.1 Lesion width (cm):  1.1 Margin per side (cm):  0.2 Final wound size (cm):  1.5 Hemostasis achieved with:  pressure, aluminum chloride and electrodesiccation Outcome: patient tolerated procedure well with no complications   Post-procedure details: sterile dressing applied and wound care instructions given   Dressing type: bandage and petrolatum    Left Distal Lateral Tricep  Epidermal / dermal shaving  Lesion diameter (cm):  1.3 Informed consent: discussed and consent obtained   Timeout: patient name, date of birth, surgical site, and procedure verified   Procedure prep:  Patient was prepped and draped in usual sterile fashion Prep type:  Isopropyl alcohol Anesthesia: the lesion was anesthetized in a standard fashion   Anesthetic:  1% lidocaine w/ epinephrine 1-100,000 buffered w/ 8.4% NaHCO3 Instrument used: flexible razor blade   Hemostasis achieved with: pressure, aluminum chloride and electrodesiccation   Outcome: patient tolerated procedure well   Post-procedure details: sterile dressing applied and wound care instructions  given   Dressing type: bandage and petrolatum    Destruction of lesion Complexity: extensive   Destruction method: electrodesiccation and curettage   Informed consent: discussed and consent obtained   Timeout:  patient name, date of birth, surgical site, and procedure verified Procedure prep:  Patient was prepped and draped in usual sterile fashion Prep type:  Isopropyl alcohol Anesthesia: the lesion was anesthetized in a standard fashion   Anesthetic:  1% lidocaine w/ epinephrine 1-100,000 buffered w/ 8.4% NaHCO3 Curettage performed in three different directions: Yes   Electrodesiccation performed over the curetted area: Yes   Lesion length (cm):  1.3 Lesion width (cm):  1.3 Margin per side (cm):  0.2 Final wound size (cm):  1.7 Hemostasis achieved with:  pressure, aluminum chloride and electrodesiccation Outcome: patient tolerated procedure well with no complications   Post-procedure details: sterile dressing applied and wound care instructions given   Dressing type: bandage and petrolatum    Left Mid Dorsum Medial Forearm  Epidermal / dermal shaving  Lesion diameter (cm):  1.3 Informed consent: discussed and consent obtained   Timeout: patient name, date of birth, surgical site, and procedure verified   Procedure prep:  Patient was prepped and draped in usual sterile fashion Prep type:  Isopropyl alcohol Anesthesia: the lesion was anesthetized in a standard fashion   Anesthetic:  1% lidocaine w/ epinephrine 1-100,000 buffered w/ 8.4% NaHCO3 Instrument used: flexible razor blade   Hemostasis achieved with: pressure, aluminum chloride and electrodesiccation   Outcome: patient tolerated procedure well   Post-procedure details:  sterile dressing applied and wound care instructions given   Dressing type: bandage and petrolatum    Destruction of lesion Complexity: extensive   Destruction method: electrodesiccation and curettage   Informed consent: discussed and consent obtained    Timeout:  patient name, date of birth, surgical site, and procedure verified Procedure prep:  Patient was prepped and draped in usual sterile fashion Prep type:  Isopropyl alcohol Anesthesia: the lesion was anesthetized in a standard fashion   Anesthetic:  1% lidocaine w/ epinephrine 1-100,000 buffered w/ 8.4% NaHCO3 Curettage performed in three different directions: Yes   Electrodesiccation performed over the curetted area: Yes   Lesion length (cm):  1.3 Lesion width (cm):  1.3 Margin per side (cm):  0.2 Final wound size (cm):  1.7 Hemostasis achieved with:  pressure, aluminum chloride and electrodesiccation Outcome: patient tolerated procedure well with no complications   Post-procedure details: sterile dressing applied and wound care instructions given   Dressing type: bandage and petrolatum     Actinic Damage - diffuse scaly erythematous macules with underlying dyspigmentation - Recommend daily broad spectrum sunscreen SPF 30+ to sun-exposed areas, reapply every 2 hours as needed.  - Call for new or changing lesions.  Return in about 2 weeks (around 03/29/2020).  Graciella Belton, RMA, am acting as scribe for Sarina Ser, MD . Documentation: I have reviewed the above documentation for accuracy and completeness, and I agree with the above.  Sarina Ser, MD

## 2020-03-22 ENCOUNTER — Telehealth: Payer: Self-pay

## 2020-03-22 ENCOUNTER — Encounter: Payer: Self-pay | Admitting: Dermatology

## 2020-03-22 NOTE — Telephone Encounter (Signed)
LMOM for office visit on 8/9

## 2020-03-26 ENCOUNTER — Ambulatory Visit: Payer: PPO | Admitting: Adult Health

## 2020-03-26 ENCOUNTER — Other Ambulatory Visit: Payer: Self-pay

## 2020-03-26 ENCOUNTER — Encounter: Payer: Self-pay | Admitting: Adult Health

## 2020-03-26 ENCOUNTER — Telehealth: Payer: Self-pay

## 2020-03-26 VITALS — BP 135/71 | HR 69 | Temp 97.5°F | Resp 16 | Ht 62.0 in | Wt 108.6 lb

## 2020-03-26 DIAGNOSIS — M810 Age-related osteoporosis without current pathological fracture: Secondary | ICD-10-CM

## 2020-03-26 DIAGNOSIS — E785 Hyperlipidemia, unspecified: Secondary | ICD-10-CM

## 2020-03-26 DIAGNOSIS — R300 Dysuria: Secondary | ICD-10-CM

## 2020-03-26 DIAGNOSIS — Z0001 Encounter for general adult medical examination with abnormal findings: Secondary | ICD-10-CM

## 2020-03-26 DIAGNOSIS — R5383 Other fatigue: Secondary | ICD-10-CM

## 2020-03-26 DIAGNOSIS — Z79899 Other long term (current) drug therapy: Secondary | ICD-10-CM

## 2020-03-26 DIAGNOSIS — Z1159 Encounter for screening for other viral diseases: Secondary | ICD-10-CM

## 2020-03-26 DIAGNOSIS — I10 Essential (primary) hypertension: Secondary | ICD-10-CM

## 2020-03-26 NOTE — Telephone Encounter (Signed)
Patient informed of pathology results 

## 2020-03-26 NOTE — Telephone Encounter (Signed)
-----   Message from Ralene Bathe, MD sent at 03/22/2020  6:03 PM EDT ----- 1. Skin , left anterior shoulder WELL DIFFERENTIATED SQUAMOUS CELL CARCINOMA 2. Skin , left lateral elbow WELL DIFFERENTIATED SQUAMOUS CELL CARCINOMA WITH SUPERFICIAL INFILTRATION 3. Skin , left distal lateral tricep SQUAMOUS CELL CARCINOMA, KERATOACANTHOMA TYPE 4. Skin , left mid dorsum medial forearm WELL DIFFERENTIATED SQUAMOUS CELL CARCINOMA WITH SUPERFICIAL INFILTRATION  1,2,3,4 - all Cancer - SCC All already treated Recheck next visit

## 2020-03-26 NOTE — Progress Notes (Signed)
[facility] [place], [place] [address]  Internal MEDICINE  Office Visit Note  Patient Name: Joann Warren  [id number]  [id number]  Date of Service: 03/26/2020  Chief Complaint  Patient presents with  . Annual Exam  . Quality Metric Gaps    TDAP, HepC     HPI Pt is here for routine health maintenance examination.    Current Medication: Outpatient Encounter Medications as of 03/26/2020  Medication Sig  . alendronate (FOSAMAX) 70 MG tablet TAKE 1 TABLET EVERY 7 DAYS WITH A FULL GLASS OF WATER ON AN EMPTY STOMACH DO NOT LIE DOWN FOR AT LEAST 30 MIN  . amLODipine (NORVASC) 5 MG tablet Take 1 tablet (5 mg total) by mouth daily.  . bimatoprost (LUMIGAN) 0.03 % ophthalmic solution   . bisoprolol-hydrochlorothiazide (ZIAC) 2.5-6.25 MG tablet TAKE ONE TABLET EVERY MORNING  . hydrocortisone 2.5 % cream APPLY TO AFFECTED AREAS ON THE SKIN EVERYDAY FOR UP TO 5 DAYS PER WEEK  . hydrOXYzine (ATARAX/VISTARIL) 10 MG tablet Take 10 mg by mouth 3 (three) times daily as needed.  . mupirocin ointment (BACTROBAN) 2 %   . simvastatin (ZOCOR) 20 MG tablet Take 1 tablet (20 mg total) by mouth at bedtime.  . triamcinolone cream (KENALOG) 0.1 % Apply 1 application topically daily as needed.  . [DISCONTINUED] donepezil (ARICEPT) 5 MG tablet Take 1 tablet (5 mg total) by mouth at bedtime. For memory  . [DISCONTINUED] ibuprofen (ADVIL,MOTRIN) 600 MG tablet Take 1 tablet (600 mg total) by mouth every 6 (six) hours as needed.   No facility-administered encounter medications on file as of 03/26/2020.    Surgical History: Past Surgical History:  Procedure Laterality Date  . BREAST EXCISIONAL BIOPSY Left 1972   neg  . BREAST EXCISIONAL BIOPSY Right 1980   neg  . COLONOSCOPY    . ESOPHAGOGASTRODUODENOSCOPY (EGD) WITH PROPOFOL N/A 04/09/2015   Procedure: ESOPHAGOGASTRODUODENOSCOPY (EGD) WITH PROPOFOL;  Surgeon: Manya Silvas, MD;  Location[facility] END[facility]COPY;  Service: Endoscopy;   Laterality: N/A;  . skin cancer removal      Medical History: Past Medical History:  Diagnosis Date  . Actinic keratosis   . Anemia   . Basal cell carcinoma 12/23/2007   Upper back.  . Basal cell carcinoma 05/17/2009   Right med lower leg  . Basal cell carcinoma 07/06/2014   Left medial breast  . Basal cell carcinoma 03/01/2015   Right medial mid pretibial  . Basal cell carcinoma 07/16/2015   Right superior breast  . Basal cell carcinoma 05/21/2016   Left inferior medial breast  . Basal cell carcinoma 02/08/2018   Left medial breast  . Basal cell carcinoma 11/01/2018   Left medial breast  . Basal cell carcinoma 03/30/2019   Left med inf breast  . Basal cell carcinoma 09/05/2019   Left proximal bicep  . Basal cell carcinoma 12/08/2019   Right forehead. Nodular pattern.  . Basal cell carcinoma 12/13/2019   Left med. breast inf. Nodular and infiltrative patterns. EDC  . Basal cell carcinoma 12/13/2019   Left med. breast. sup. Nodular pattern. EDC  . Cancer (HCC)    squamous cell  . Glaucoma   . Hyperlipidemia   . Hypertension   . Osteoporosis   . Squamous cell carcinoma of skin 12/08/2007   Left lower leg. WD SCC  . Squamous cell carcinoma of skin 02/01/2008   Left distal med. pretibial. WD SCC  . Squamous cell carcinoma of skin 02/01/2008   Right  mid pretibial. WD SCC  . Squamous cell carcinoma of skin 02/01/2008   Right mid pretibal inferior. WD SCC with superficial infiltration.  . Squamous cell carcinoma of skin 04/05/2008   Left dorsum hand. WD SCC with superficial infiltration.  . Squamous cell carcinoma of skin 06/15/2008   Right lower leg inferior. WD SCC  . Squamous cell carcinoma of skin 06/15/2008   Right lower leg superior. SCCis hypertrophic  . Squamous cell carcinoma of skin 07/05/2008   Right lat. lower leg, Right lower leg, Left lower leg  . Squamous cell carcinoma of skin 08/04/2008   Right ant. lower leg, Left med. lower leg  . Squamous cell  carcinoma of skin 09/01/2008   Right post. lower leg  . Squamous cell carcinoma of skin 10/04/2008   Left post leg  . Squamous cell carcinoma of skin 12/01/2008   Left lower leg  . Squamous cell carcinoma of skin 03/08/2009   Right ant. mid lower leg, Right lower leg  . Squamous cell carcinoma of skin 05/17/2009   Left lower post. leg superior  . Squamous cell carcinoma of skin 10/11/2009   Left lower leg. KA-pattern  . Squamous cell carcinoma of skin 03/28/2010   Right lower leg  . Squamous cell carcinoma of skin 03/25/2013   Right lower leg  . Squamous cell carcinoma of skin 06/23/2013   Right post. lower leg. Right medial lower leg. Left medial lower leg  . Squamous cell carcinoma of skin 09/09/2013   Left anterior upper arm. Right anterior lower leg. Right lat. lower leg. Right below knee lower leg  . Squamous cell carcinoma of skin 12/08/2013   Right posterior lower leg sup. Right forearm  . Squamous cell carcinoma of skin 03/03/2014   Right posterior leg. Right med. lower leg  . Squamous cell carcinoma of skin 06/22/2014   Right to of shoulder anterior. Right top of shoulder posterior  . Squamous cell carcinoma of skin 08/03/2014   Right anterior forearm  . Squamous cell carcinoma of skin 09/14/2014   Right chest  . Squamous cell carcinoma of skin 10/30/2014   Right calf  . Squamous cell carcinoma of skin 12/14/2014   Right med. distal pretibial sup. Right med distal pretibial inf. Left proximal bicep sup. Left proximal bicep inf.  . Squamous cell carcinoma of skin 03/01/2015   Left lat proximal pretibial near knee  . Squamous cell carcinoma of skin 03/29/2015   Left mid to prox. pretibial. Left mid. lat. pretibial.   . Squamous cell carcinoma of skin 04/19/2015   Right mid calf. Right distal lat. pretibial  . Squamous cell carcinoma of skin 05/31/2015   Right proximal med. pretibial ant. Right proximal med. pretibial posterior  . Squamous cell carcinoma of skin  07/16/2015   Right upper arm inf. deltoid  . Squamous cell carcinoma of skin 08/06/2015   Left distal pretibial  . Squamous cell carcinoma of skin 09/12/2015   Left postauricular area  . Squamous cell carcinoma of skin 12/12/2015   Left proximal pretibial x4 sites  . Squamous cell carcinoma of skin 12/31/2015   Right med. lower leg above ankle sup. Right med. lower leg above ankle inf. Left inf. lat. calf med. Left inf. calf lat.  . Squamous cell carcinoma of skin 01/31/2016   Right med. sup. ankle area. Right med mid proximal calf. Left dorsum foot.   . Squamous cell carcinoma of skin 03/17/2016   Left post. distal calf lateral. Left post distal calf med.  Left mid lat ant. thigh. Right mid lat. calf  . Squamous cell carcinoma of skin 05/21/2016   Right proximal medial pretibial. Right mid med. pretibial.  . Squamous cell carcinoma of skin 07/03/2016   Right mid med. pretibial. Right proximal med. pretibial  . Squamous cell carcinoma of skin 08/20/2016   Right medial calf. Left distal pretibial. Right upper back paraspinal  . Squamous cell carcinoma of skin 10/30/2016   Right medial calf x2  . Squamous cell carcinoma of skin 12/10/2016   left mid back 6.0cm lat to spine. Left sup. lat. calf. Left inf. post. calf  . Squamous cell carcinoma of skin 12/25/2016   Right medial above ankle. Right mid pretibial. Right prox. pretibial. Right prox. pretibial med.   . Squamous cell carcinoma of skin 01/19/2017   Left forearm. Left ant. neck  . Squamous cell carcinoma of skin 03/23/2017   Left med. pretibial below knee  . Squamous cell carcinoma of skin 04/13/2017   Right lat. sup. ankle. Right medial ankle  . Squamous cell carcinoma of skin 04/27/2017   Left med. distal prox. calf. Left medial distal calf. Left lat. distal prox. calf. Left lat distal distal calf  . Squamous cell carcinoma of skin 05/14/2017   Left bicep. Left dorsum mid forearm  . Squamous cell carcinoma of skin 06/25/2017    Right med. distal calf x3  . Squamous cell carcinoma of skin 09/02/2017   Left med. infraclavicular  . Squamous cell carcinoma of skin 12/21/2017   Left prox. med. lower leg. Right forearm. Left forearm. Left bicep  . Squamous cell carcinoma of skin 01/07/2018   Left dorsum foot prox. Left dorsum foot distal. Left mid to distal calf. Right chest  . Squamous cell carcinoma of skin 03/08/2018   Left bicep. Right proximal dorsum forearm. Right distal med. pretibial sup. Right distal med pretibial inf.   . Squamous cell carcinoma of skin 04/05/2018   Right sup. med scapula  . Squamous cell carcinoma of skin 06/03/2018   Left med. ankle sup. Left med. ankle inf. Left lateral ankle.  Marland Kitchen Squamous cell carcinoma of skin 09/01/2018   Right mid lat. calf. Right lat. knee. Prox post calf  . Squamous cell carcinoma of skin 11/01/2018   Left lateral ankle  . Squamous cell carcinoma of skin 12/08/2018   Right lateral ankle lat malleolus  . Squamous cell carcinoma of skin 01/05/2019   Right lateral elbow. Left mid medial calf. Left prox. ant. thigh. Right distal lat tricep  . Squamous cell carcinoma of skin 01/19/2019   Right popliteal  . Squamous cell carcinoma of skin 02/03/2019   Left mid lat calf. Left mid calf. Left dorsum lat distal foot. Left dorsum distal foot  . Squamous cell carcinoma of skin 03/30/2019   Right lower leg above med ankle ant. Right lower leg above ankle sup. Right lower leg above the med ankle inf.  . Squamous cell carcinoma of skin 05/12/2019   Right chest medial. Left deltoid  . Squamous cell carcinoma of skin 06/23/2019   Right ant thigh distal above knee. Left med distal thigh. Left ant thigh above knee  . Squamous cell carcinoma of skin 07/20/2019   Right chest. Right dorsal hand.  . Squamous cell carcinoma of skin 08/03/2019   Left distal lat pretibial. Left distal med calf. Right prox. lat calf  . Squamous cell carcinoma of skin 09/05/2019   Right middle bicep.  Right sup lat bicep. Right sup mid bicep. Right  sup med bicep  . Squamous cell carcinoma of skin 09/15/2019   Right proximal med pretibial. Left distal lat thigh    Family History: Family History  Problem Relation Age of Onset  . Breast cancer Sister 63  . Atrial fibrillation Mother       Review of Systems   Vital Signs: BP 135/71   Pulse 69   Temp (!) 97.5 F (36.4 C)   Resp 16   Ht 5\' 2"  (1.575 m)   Wt 108 lb 9.6 oz (49.3 kg)   SpO2 100%   BMI 19.86 kg/m    Physical Exam Chest:     Breasts:        Right: Normal.        Left: Normal.     Comments: Exam Chaperoned by Corlis Hove CMA     LABS: No results found for this or any previous visit (from the past 2160 hour(s)).    Assessment/Plan:    General Counseling: Nahima verbalizes understanding of the findings of todays visit and agrees with plan of treatment. I have discussed any further diagnostic evaluation that may be needed or ordered today. We also reviewed her medications today. she has been encouraged to call the office with any questions or concerns that should arise related to todays visit.   No orders of the defined types were placed in this encounter.   No orders of the defined types were placed in this encounter.   Time spent: 30 Minutes   This patient was seen by Orson Gear AGNP-C in Collaboration with Dr Lavera Guise as a part of collaborative care agreement    Kendell Bane AGNP-C Internal Medicine

## 2020-03-27 DIAGNOSIS — Z0001 Encounter for general adult medical examination with abnormal findings: Secondary | ICD-10-CM | POA: Diagnosis not present

## 2020-03-27 DIAGNOSIS — Z79899 Other long term (current) drug therapy: Secondary | ICD-10-CM | POA: Diagnosis not present

## 2020-03-27 DIAGNOSIS — R5383 Other fatigue: Secondary | ICD-10-CM | POA: Diagnosis not present

## 2020-03-27 DIAGNOSIS — Z1159 Encounter for screening for other viral diseases: Secondary | ICD-10-CM | POA: Diagnosis not present

## 2020-03-27 LAB — UA/M W/RFLX CULTURE, ROUTINE
Bilirubin, UA: NEGATIVE
Glucose, UA: NEGATIVE
Ketones, UA: NEGATIVE
Leukocytes,UA: NEGATIVE
Nitrite, UA: NEGATIVE
Protein,UA: NEGATIVE
RBC, UA: NEGATIVE
Specific Gravity, UA: 1.014 (ref 1.005–1.030)
Urobilinogen, Ur: 0.2 mg/dL (ref 0.2–1.0)
pH, UA: 6 (ref 5.0–7.5)

## 2020-03-27 LAB — MICROSCOPIC EXAMINATION
Bacteria, UA: NONE SEEN
Casts: NONE SEEN /lpf
Epithelial Cells (non renal): NONE SEEN /hpf (ref 0–10)
RBC, Urine: NONE SEEN /hpf (ref 0–2)
WBC, UA: NONE SEEN /hpf (ref 0–5)

## 2020-03-28 LAB — COMPREHENSIVE METABOLIC PANEL
ALT: 8 IU/L (ref 0–32)
AST: 15 IU/L (ref 0–40)
Albumin/Globulin Ratio: 1.7 (ref 1.2–2.2)
Albumin: 4.6 g/dL (ref 3.7–4.7)
Alkaline Phosphatase: 70 IU/L (ref 48–121)
BUN/Creatinine Ratio: 10 — ABNORMAL LOW (ref 12–28)
BUN: 12 mg/dL (ref 8–27)
Bilirubin Total: 0.4 mg/dL (ref 0.0–1.2)
CO2: 27 mmol/L (ref 20–29)
Calcium: 9.9 mg/dL (ref 8.7–10.3)
Chloride: 98 mmol/L (ref 96–106)
Creatinine, Ser: 1.21 mg/dL — ABNORMAL HIGH (ref 0.57–1.00)
GFR calc Af Amer: 51 mL/min/{1.73_m2} — ABNORMAL LOW (ref >59)
GFR calc non Af Amer: 44 mL/min/{1.73_m2} — ABNORMAL LOW (ref >59)
Globulin, Total: 2.7 g/dL (ref 1.5–4.5)
Glucose: 102 mg/dL — ABNORMAL HIGH (ref 65–99)
Potassium: 5.1 mmol/L (ref 3.5–5.2)
Sodium: 140 mmol/L (ref 134–144)
Total Protein: 7.3 g/dL (ref 6.0–8.5)

## 2020-03-28 LAB — B12 AND FOLATE PANEL
Folate: 5.5 ng/mL (ref >3.0)
Vitamin B-12: 767 pg/mL (ref 232–1245)

## 2020-03-28 LAB — IRON,TIBC AND FERRITIN PANEL
Ferritin: 83 ng/mL (ref 15–150)
Iron Saturation: 28 % (ref 15–55)
Iron: 71 ug/dL (ref 27–139)
Total Iron Binding Capacity: 256 ug/dL (ref 250–450)
UIBC: 185 ug/dL (ref 118–369)

## 2020-03-28 LAB — CBC WITH DIFFERENTIAL/PLATELET
Basophils Absolute: 0 10*3/uL (ref 0.0–0.2)
Basos: 0 %
EOS (ABSOLUTE): 0.3 10*3/uL (ref 0.0–0.4)
Eos: 3 %
Hematocrit: 38.9 % (ref 34.0–46.6)
Hemoglobin: 12.8 g/dL (ref 11.1–15.9)
Immature Grans (Abs): 0 10*3/uL (ref 0.0–0.1)
Immature Granulocytes: 0 %
Lymphocytes Absolute: 1.7 10*3/uL (ref 0.7–3.1)
Lymphs: 19 %
MCH: 29.2 pg (ref 26.6–33.0)
MCHC: 32.9 g/dL (ref 31.5–35.7)
MCV: 89 fL (ref 79–97)
Monocytes Absolute: 0.5 10*3/uL (ref 0.1–0.9)
Monocytes: 6 %
Neutrophils Absolute: 6.1 10*3/uL (ref 1.4–7.0)
Neutrophils: 72 %
Platelets: 248 10*3/uL (ref 150–450)
RBC: 4.38 x10E6/uL (ref 3.77–5.28)
RDW: 12.7 % (ref 11.7–15.4)
WBC: 8.7 10*3/uL (ref 3.4–10.8)

## 2020-03-28 LAB — LIPID PANEL WITH LDL/HDL RATIO
Cholesterol, Total: 200 mg/dL — ABNORMAL HIGH (ref 100–199)
HDL: 73 mg/dL (ref >39)
LDL Chol Calc (NIH): 114 mg/dL — ABNORMAL HIGH (ref 0–99)
LDL/HDL Ratio: 1.6 ratio (ref 0.0–3.2)
Triglycerides: 73 mg/dL (ref 0–149)
VLDL Cholesterol Cal: 13 mg/dL (ref 5–40)

## 2020-03-28 LAB — TSH: TSH: 3.54 u[IU]/mL (ref 0.450–4.500)

## 2020-03-28 LAB — VITAMIN D 25 HYDROXY (VIT D DEFICIENCY, FRACTURES): Vit D, 25-Hydroxy: 49.4 ng/mL (ref 30.0–100.0)

## 2020-03-28 LAB — HEPATITIS C ANTIBODY: Hep C Virus Ab: <0.1 s/co ratio (ref 0.0–0.9)

## 2020-03-28 LAB — T4, FREE: Free T4: 1.17 ng/dL (ref 0.82–1.77)

## 2020-03-29 ENCOUNTER — Other Ambulatory Visit: Payer: Self-pay

## 2020-03-29 ENCOUNTER — Ambulatory Visit
Admission: RE | Admit: 2020-03-29 | Discharge: 2020-03-29 | Disposition: A | Discharge status: Home / Self Care | Payer: PPO | Source: Ambulatory Visit | Attending: Internal Medicine | Admitting: Internal Medicine

## 2020-03-29 DIAGNOSIS — Z1231 Encounter for screening mammogram for malignant neoplasm of breast: Secondary | ICD-10-CM | POA: Insufficient documentation

## 2020-04-02 ENCOUNTER — Ambulatory Visit: Payer: PPO | Admitting: Dermatology

## 2020-04-02 ENCOUNTER — Other Ambulatory Visit: Payer: Self-pay

## 2020-04-02 DIAGNOSIS — D0471 Carcinoma in situ of skin of right lower limb, including hip: Secondary | ICD-10-CM | POA: Diagnosis not present

## 2020-04-02 DIAGNOSIS — L578 Other skin changes due to chronic exposure to nonionizing radiation: Secondary | ICD-10-CM

## 2020-04-02 DIAGNOSIS — B078 Other viral warts: Secondary | ICD-10-CM | POA: Diagnosis not present

## 2020-04-02 DIAGNOSIS — D485 Neoplasm of uncertain behavior of skin: Secondary | ICD-10-CM | POA: Diagnosis not present

## 2020-04-02 DIAGNOSIS — C44722 Squamous cell carcinoma of skin of right lower limb, including hip: Secondary | ICD-10-CM | POA: Diagnosis not present

## 2020-04-02 DIAGNOSIS — L570 Actinic keratosis: Secondary | ICD-10-CM | POA: Diagnosis not present

## 2020-04-02 DIAGNOSIS — Z85828 Personal history of other malignant neoplasm of skin: Secondary | ICD-10-CM

## 2020-04-02 MED ORDER — MUPIROCIN 2 % EX OINT: TOPICAL_OINTMENT | Freq: Every day | CUTANEOUS | 2 refills | Status: DC

## 2020-04-02 NOTE — Progress Notes (Addendum)
Follow-Up Visit   Subjective  Joann Warren is a 74 y.o. female who presents for the following: Other (Spots of right calf, left ankle - sore).  The following portions of the chart were reviewed this encounter and updated as appropriate:  Tobacco  Allergies  Meds  Problems  Med Hx  Surg Hx  Fam Hx     Review of Systems:  No other skin or systemic complaints except as noted in HPI or Assessment and Plan.  Objective  Well appearing patient in no apparent distress; mood and affect are within normal limits.  A focused examination was performed including legs. Relevant physical exam findings are noted in the Assessment and Plan.  Objective  Left lat ankle: 1.1 cm hyperkeratotic papule  Objective  Right calf: 1.1 cm hyperkeratotic papule  Objective  Below right medial knee sup:  0.7 cm hyperkeratotic papule  Objective  Below right medial knee ant: 0.7 cm hyperkeratotic papule  Objective  Below right medial knee post: 1.0 cm hyperkeratotic papule  Objective  Below right medial knee inf: 0.7 cm hyperkeratotic papule   Assessment & Plan   History of Squamous Cell Carcinoma of the Skin Pt has had multiple;multiple SCCs treated in the past and we have done Radiation of both lower legs with Dr Baruch Gouty at [facility] with some control and improvement, but she has persistent frequently occurring SCCs that we treat several new ones every few weeks.  These are surely sun damage related but may have HPV component too.  We have done "field treatments" in past with Photodynamic therapy with Levulan as well as "5-FU Chemowraps".  Because of the persistent issue, I would like to send patient for evaluation to [place] to see if she may be a candidate for CAPECITABINE oral (5-FU) chemotherapy - or other agent that patient may benefit from.    - No evidence of recurrence today - No lymphadenopathy - Recommend regular full body skin exams -  Recommend daily broad spectrum sunscreen SPF 30+ to sun-exposed areas, reapply every 2 hours as needed.  - Call if any new or changing lesions are noted between office visits -  Actinic Damage - diffuse scaly erythematous macules with underlying dyspigmentation - Recommend daily broad spectrum sunscreen SPF 30+ to sun-exposed areas, reapply every 2 hours as needed.  - Call for new or changing lesions.  Neoplasm of uncertain behavior of skin (6) Left foot / lat ankle  Epidermal / dermal shaving  Lesion diameter (cm):  1.1 Informed consent: discussed and consent obtained   Timeout: patient name, date of birth, surgical site, and procedure verified   Procedure prep:  Patient was prepped and draped in usual sterile fashion Prep type:  Isopropyl alcohol Anesthesia: the lesion was anesthetized in a standard fashion   Anesthetic:  1% lidocaine w/ epinephrine 1-100,000 buffered w/ 8.4% NaHCO3 Instrument used: flexible razor blade   Hemostasis achieved with: pressure, aluminum chloride and electrodesiccation   Outcome: patient tolerated procedure well   Post-procedure details: sterile dressing applied and wound care instructions given   Dressing type: bandage and petrolatum    Destruction of lesion Complexity: extensive   Destruction method: electrodesiccation and curettage   Informed consent: discussed and consent obtained   Timeout:  patient name, date of birth, surgical site, and procedure verified Procedure prep:  Patient was prepped and draped in usual sterile fashion Prep type:  Isopropyl alcohol Anesthesia: the lesion was anesthetized in a standard fashion  Anesthetic:  1% lidocaine w/ epinephrine 1-100,000 buffered w/ 8.4% NaHCO3 Curettage performed in three different directions: Yes   Electrodesiccation performed over the curetted area: Yes   Lesion length (cm):  1.1 Lesion width (cm):  1.1 Margin per side (cm):  0.2 Final wound size (cm):  1.5 Hemostasis achieved with:   pressure and aluminum chloride Outcome: patient tolerated procedure well with no complications   Post-procedure details: sterile dressing applied and wound care instructions given   Dressing type: bandage and petrolatum    Specimen 1 - Surgical pathology Differential Diagnosis: SCC vs other  Check Margins: No 1.1 cm hyperkeratotic papule EDC today  Right calf  Epidermal / dermal shaving  Lesion diameter (cm):  1.1 Informed consent: discussed and consent obtained   Timeout: patient name, date of birth, surgical site, and procedure verified   Procedure prep:  Patient was prepped and draped in usual sterile fashion Prep type:  Isopropyl alcohol Anesthesia: the lesion was anesthetized in a standard fashion   Anesthetic:  1% lidocaine w/ epinephrine 1-100,000 buffered w/ 8.4% NaHCO3 Instrument used: flexible razor blade   Hemostasis achieved with: pressure, aluminum chloride and electrodesiccation   Outcome: patient tolerated procedure well   Post-procedure details: sterile dressing applied and wound care instructions given   Dressing type: bandage and petrolatum    Destruction of lesion Complexity: extensive   Destruction method: electrodesiccation and curettage   Informed consent: discussed and consent obtained   Timeout:  patient name, date of birth, surgical site, and procedure verified Procedure prep:  Patient was prepped and draped in usual sterile fashion Prep type:  Isopropyl alcohol Anesthesia: the lesion was anesthetized in a standard fashion   Anesthetic:  1% lidocaine w/ epinephrine 1-100,000 buffered w/ 8.4% NaHCO3 Curettage performed in three different directions: Yes   Electrodesiccation performed over the curetted area: Yes   Lesion length (cm):  1.1 Lesion width (cm):  1.1 Margin per side (cm):  0.2 Final wound size (cm):  1.5 Hemostasis achieved with:  pressure and aluminum chloride Outcome: patient tolerated procedure well with no complications     Post-procedure details: sterile dressing applied and wound care instructions given   Dressing type: bandage and petrolatum    Specimen 2 - Surgical pathology Differential Diagnosis: SCC vs other  Check Margins: No 1.1 cm hyperkeratotic papule EDC today  Below right medial knee sup  Epidermal / dermal shaving  Lesion diameter (cm):  0.7 Informed consent: discussed and consent obtained   Timeout: patient name, date of birth, surgical site, and procedure verified   Procedure prep:  Patient was prepped and draped in usual sterile fashion Prep type:  Isopropyl alcohol Anesthesia: the lesion was anesthetized in a standard fashion   Anesthetic:  1% lidocaine w/ epinephrine 1-100,000 buffered w/ 8.4% NaHCO3 Instrument used: flexible razor blade   Hemostasis achieved with: pressure, aluminum chloride and electrodesiccation   Outcome: patient tolerated procedure well   Post-procedure details: sterile dressing applied and wound care instructions given   Dressing type: bandage and petrolatum    Destruction of lesion Complexity: extensive   Destruction method: electrodesiccation and curettage   Informed consent: discussed and consent obtained   Timeout:  patient name, date of birth, surgical site, and procedure verified Procedure prep:  Patient was prepped and draped in usual sterile fashion Prep type:  Isopropyl alcohol Anesthesia: the lesion was anesthetized in a standard fashion   Anesthetic:  1% lidocaine w/ epinephrine 1-100,000 buffered w/ 8.4% NaHCO3 Curettage performed in three different  directions: Yes   Electrodesiccation performed over the curetted area: Yes   Lesion length (cm):  0.7 Lesion width (cm):  0.7 Margin per side (cm):  0.2 Final wound size (cm):  1.1 Hemostasis achieved with:  pressure and aluminum chloride Outcome: patient tolerated procedure well with no complications   Post-procedure details: sterile dressing applied and wound care instructions given    Dressing type: bandage and petrolatum    Specimen 3 - Surgical pathology Differential Diagnosis: SCC vs other  Check Margins: No 0.7 cm hyperkeratotic papule EDC today  Below right medial knee ant  Epidermal / dermal shaving  Lesion diameter (cm):  0.7 Informed consent: discussed and consent obtained   Timeout: patient name, date of birth, surgical site, and procedure verified   Procedure prep:  Patient was prepped and draped in usual sterile fashion Prep type:  Isopropyl alcohol Anesthesia: the lesion was anesthetized in a standard fashion   Anesthetic:  1% lidocaine w/ epinephrine 1-100,000 buffered w/ 8.4% NaHCO3 Instrument used: flexible razor blade   Hemostasis achieved with: pressure, aluminum chloride and electrodesiccation   Outcome: patient tolerated procedure well   Post-procedure details: sterile dressing applied and wound care instructions given   Dressing type: bandage and petrolatum    Destruction of lesion Complexity: extensive   Destruction method: electrodesiccation and curettage   Informed consent: discussed and consent obtained   Timeout:  patient name, date of birth, surgical site, and procedure verified Procedure prep:  Patient was prepped and draped in usual sterile fashion Prep type:  Isopropyl alcohol Anesthesia: the lesion was anesthetized in a standard fashion   Anesthetic:  1% lidocaine w/ epinephrine 1-100,000 buffered w/ 8.4% NaHCO3 Curettage performed in three different directions: Yes   Electrodesiccation performed over the curetted area: Yes   Lesion length (cm):  0.7 Lesion width (cm):  0.7 Margin per side (cm):  0.2 Final wound size (cm):  1.1 Hemostasis achieved with:  pressure and aluminum chloride Outcome: patient tolerated procedure well with no complications   Post-procedure details: sterile dressing applied and wound care instructions given   Dressing type: bandage and petrolatum    Specimen 4 - Surgical pathology Differential  Diagnosis: SCC vs other  Check Margins: No 0.7 cm hyperkeratotic papule EDC today  Below right medial knee post  Epidermal / dermal shaving  Lesion diameter (cm):  1 Informed consent: discussed and consent obtained   Timeout: patient name, date of birth, surgical site, and procedure verified   Procedure prep:  Patient was prepped and draped in usual sterile fashion Prep type:  Isopropyl alcohol Anesthesia: the lesion was anesthetized in a standard fashion   Anesthetic:  1% lidocaine w/ epinephrine 1-100,000 buffered w/ 8.4% NaHCO3 Instrument used: flexible razor blade   Hemostasis achieved with: pressure, aluminum chloride and electrodesiccation   Outcome: patient tolerated procedure well   Post-procedure details: sterile dressing applied and wound care instructions given   Dressing type: bandage and petrolatum    Destruction of lesion Complexity: extensive   Destruction method: electrodesiccation and curettage   Informed consent: discussed and consent obtained   Timeout:  patient name, date of birth, surgical site, and procedure verified Procedure prep:  Patient was prepped and draped in usual sterile fashion Prep type:  Isopropyl alcohol Anesthesia: the lesion was anesthetized in a standard fashion   Anesthetic:  1% lidocaine w/ epinephrine 1-100,000 buffered w/ 8.4% NaHCO3 Curettage performed in three different directions: Yes   Electrodesiccation performed over the curetted area: Yes   Lesion  length (cm):  1 Lesion width (cm):  1 Margin per side (cm):  0.2 Final wound size (cm):  1.4 Hemostasis achieved with:  pressure and aluminum chloride Outcome: patient tolerated procedure well with no complications   Post-procedure details: sterile dressing applied and wound care instructions given   Dressing type: bandage and petrolatum    Specimen 5 - Surgical pathology Differential Diagnosis: SCC vs other  Check Margins: No 1.0 cm hyperkeratotic papule EDC today   Below  right medial knee inf  Epidermal / dermal shaving  Lesion diameter (cm):  0.7 Informed consent: discussed and consent obtained   Timeout: patient name, date of birth, surgical site, and procedure verified   Procedure prep:  Patient was prepped and draped in usual sterile fashion Prep type:  Isopropyl alcohol Anesthesia: the lesion was anesthetized in a standard fashion   Anesthetic:  1% lidocaine w/ epinephrine 1-100,000 buffered w/ 8.4% NaHCO3 Instrument used: flexible razor blade   Hemostasis achieved with: pressure, aluminum chloride and electrodesiccation   Outcome: patient tolerated procedure well   Post-procedure details: sterile dressing applied and wound care instructions given   Dressing type: bandage and petrolatum    Destruction of lesion Complexity: extensive   Destruction method: electrodesiccation and curettage   Informed consent: discussed and consent obtained   Timeout:  patient name, date of birth, surgical site, and procedure verified Procedure prep:  Patient was prepped and draped in usual sterile fashion Prep type:  Isopropyl alcohol Anesthesia: the lesion was anesthetized in a standard fashion   Anesthetic:  1% lidocaine w/ epinephrine 1-100,000 buffered w/ 8.4% NaHCO3 Curettage performed in three different directions: Yes   Electrodesiccation performed over the curetted area: Yes   Lesion length (cm):  0.7 Lesion width (cm):  0.7 Margin per side (cm):  0.2 Final wound size (cm):  1.1 Hemostasis achieved with:  pressure and aluminum chloride Outcome: patient tolerated procedure well with no complications   Post-procedure details: sterile dressing applied and wound care instructions given   Dressing type: bandage and petrolatum    Specimen 6 - Surgical pathology Differential Diagnosis: SCC vs other  Check Margins: No 0.7 cm hyperkeratotic papule EDC today  Discussed systemic chemotherapy. Will refer to [facility] oncology for evalution.  Other Related  Procedures Ambulatory referral to Oncology  Reordered Medications mupirocin ointment (BACTROBAN) 2 %  Return in about 6 weeks (around 05/14/2020).  I, Ashok Cordia, CMA, am acting as scribe for Sarina Ser, MD .  Documentation: I have reviewed the above documentation for accuracy and completeness, and I agree with the above.  Sarina Ser, MD

## 2020-04-02 NOTE — Patient Instructions (Signed)

## 2020-04-03 NOTE — Progress Notes (Signed)
Labs reviewed, stable update renal us/ and RA u/s

## 2020-04-05 ENCOUNTER — Telehealth: Payer: Self-pay

## 2020-04-05 ENCOUNTER — Encounter: Payer: Self-pay | Admitting: Dermatology

## 2020-04-05 NOTE — Telephone Encounter (Signed)
Confirmed both office visits on 8/23

## 2020-04-09 ENCOUNTER — Encounter: Payer: Self-pay | Admitting: Adult Health

## 2020-04-09 ENCOUNTER — Encounter: Payer: Self-pay | Admitting: Oncology

## 2020-04-09 ENCOUNTER — Telehealth: Payer: Self-pay

## 2020-04-09 ENCOUNTER — Other Ambulatory Visit: Payer: Self-pay

## 2020-04-09 ENCOUNTER — Ambulatory Visit (INDEPENDENT_AMBULATORY_CARE_PROVIDER_SITE_OTHER): Payer: PPO | Admitting: Adult Health

## 2020-04-09 ENCOUNTER — Ambulatory Visit: Payer: PPO

## 2020-04-09 VITALS — BP 118/78 | HR 79 | Temp 96.9°F | Resp 16 | Ht 62.0 in | Wt 108.0 lb

## 2020-04-09 DIAGNOSIS — R7989 Other specified abnormal findings of blood chemistry: Secondary | ICD-10-CM | POA: Diagnosis not present

## 2020-04-09 DIAGNOSIS — C4492 Squamous cell carcinoma of skin, unspecified: Secondary | ICD-10-CM

## 2020-04-09 DIAGNOSIS — E785 Hyperlipidemia, unspecified: Secondary | ICD-10-CM

## 2020-04-09 DIAGNOSIS — I10 Essential (primary) hypertension: Secondary | ICD-10-CM | POA: Diagnosis not present

## 2020-04-09 DIAGNOSIS — E538 Deficiency of other specified B group vitamins: Secondary | ICD-10-CM | POA: Diagnosis not present

## 2020-04-09 MED ORDER — CYANOCOBALAMIN 1000 MCG/ML IJ SOLN
1000.0000 ug | Freq: Once | INTRAMUSCULAR | Status: AC
  Administered 2020-04-09: 1000 ug via INTRAMUSCULAR

## 2020-04-09 NOTE — Telephone Encounter (Signed)
-----   Message from Ralene Bathe, MD sent at 04/05/2020  6:05 PM EDT ----- 1. Skin , left lat ankle VERRUCA VULGARIS, IRRITATED 2. Skin , right calf WELL DIFFERENTIATED SQUAMOUS CELL CARCINOMA, BASE INVOLVED 3. Skin , below right medial knee sup SQUAMOUS CELL CARCINOMA IN SITU, CLOSE TO MARGIN 4. Skin , below right medial knee ant HYPERTROPHIC ACTINIC KERATOSIS WITH FEATURES OF A VERRUCA 5. Skin , below right medial knee post WELL DIFFERENTIATED SQUAMOUS CELL CARCINOMA, CLOSE TO MARGIN 6. Skin , below right medial knee inf SQUAMOUS CELL CARCINOMA IN SITU, BASE INVOLVED  1- viral wart 2, 3, 5, 6 - skin cancer - SCC 4- PreCancer All 6 already treated Recheck next visit Keep follow up appts

## 2020-04-09 NOTE — Telephone Encounter (Signed)
Advised patient's husband of results/hd °

## 2020-04-09 NOTE — Progress Notes (Signed)
[facility] [place], [place] [address]  Internal MEDICINE  Office Visit Note  Patient Name: Joann Warren  [id number]  [id number]  Date of Service: 04/09/2020  Chief Complaint  Patient presents with  . Follow-up    2 week review labs/ b-12  . Quality Metric Gaps    Tdap, Pneumonia vacc    HPI  Pt is here for follow up on physical labs. Overall her labs are good.  We have been monitoring her creatine, which remains elevated, yet stable at this time.  On a Renal US in september 2020, she had a right renal cyst.  Since her creatine has increased, will repeat renal US to evaluate size of cyst currently.     Current Medication: Outpatient Encounter Medications as of 04/09/2020  Medication Sig  . alendronate (FOSAMAX) 70 MG tablet TAKE 1 TABLET EVERY 7 DAYS WITH A FULL GLASS OF WATER ON AN EMPTY STOMACH DO NOT LIE DOWN FOR AT LEAST 30 MIN  . amLODipine (NORVASC) 5 MG tablet Take 1 tablet (5 mg total) by mouth daily.  . bimatoprost (LUMIGAN) 0.03 % ophthalmic solution   . bisoprolol-hydrochlorothiazide (ZIAC) 2.5-6.25 MG tablet TAKE ONE TABLET EVERY MORNING  . hydrocortisone 2.5 % cream APPLY TO AFFECTED AREAS ON THE SKIN EVERYDAY FOR UP TO 5 DAYS PER WEEK  . hydrOXYzine (ATARAX/VISTARIL) 10 MG tablet Take 10 mg by mouth 3 (three) times daily as needed.  . mupirocin ointment (BACTROBAN) 2 % Apply topically daily.  . simvastatin (ZOCOR) 20 MG tablet Take 1 tablet (20 mg total) by mouth at bedtime.  . triamcinolone cream (KENALOG) 0.1 % Apply 1 application topically daily as needed.  . [EXPIRED] cyanocobalamin ((VITAMIN B-12)) injection 1,000 mcg    No facility-administered encounter medications on file as of 04/09/2020.    Surgical History: Past Surgical History:  Procedure Laterality Date  . BREAST EXCISIONAL BIOPSY Left 1972   neg  . BREAST EXCISIONAL BIOPSY Right 1980   neg  . COLONOSCOPY    . ESOPHAGOGASTRODUODENOSCOPY (EGD) WITH PROPOFOL N/A  04/09/2015   Procedure: ESOPHAGOGASTRODUODENOSCOPY (EGD) WITH PROPOFOL;  Surgeon: Manya Silvas, MD;  Location: [facility] ENDOSCOPY;  Service: Endoscopy;  Laterality: N/A;  . skin cancer removal      Medical History: Past Medical History:  Diagnosis Date  . Actinic keratosis   . Anemia   . Basal cell carcinoma 12/23/2007   Upper back.  . Basal cell carcinoma 05/17/2009   Right med lower leg  . Basal cell carcinoma 07/06/2014   Left medial breast  . Basal cell carcinoma 03/01/2015   Right medial mid pretibial  . Basal cell carcinoma 07/16/2015   Right superior breast  . Basal cell carcinoma 05/21/2016   Left inferior medial breast  . Basal cell carcinoma 02/08/2018   Left medial breast  . Basal cell carcinoma 11/01/2018   Left medial breast  . Basal cell carcinoma 03/30/2019   Left med inf breast  . Basal cell carcinoma 09/05/2019   Left proximal bicep  . Basal cell carcinoma 12/08/2019   Right forehead. Nodular pattern.  . Basal cell carcinoma 12/13/2019   Left med. breast inf. Nodular and infiltrative patterns. EDC  . Basal cell carcinoma 12/13/2019   Left med. breast. sup. Nodular pattern. EDC  . Cancer (HCC)    squamous cell  . Glaucoma   . Hyperlipidemia   . Hypertension   . Osteoporosis   . Squamous cell carcinoma of skin 12/08/2007   Left lower  leg. WD SCC  . Squamous cell carcinoma of skin 02/01/2008   Left distal med. pretibial. WD SCC  . Squamous cell carcinoma of skin 02/01/2008   Right mid pretibial. WD SCC  . Squamous cell carcinoma of skin 02/01/2008   Right mid pretibal inferior. WD SCC with superficial infiltration.  . Squamous cell carcinoma of skin 04/05/2008   Left dorsum hand. WD SCC with superficial infiltration.  . Squamous cell carcinoma of skin 06/15/2008   Right lower leg inferior. WD SCC  . Squamous cell carcinoma of skin 06/15/2008   Right lower leg superior. SCCis hypertrophic  . Squamous cell carcinoma of skin 07/05/2008   Right lat.  lower leg, Right lower leg, Left lower leg  . Squamous cell carcinoma of skin 08/04/2008   Right ant. lower leg, Left med. lower leg  . Squamous cell carcinoma of skin 09/01/2008   Right post. lower leg  . Squamous cell carcinoma of skin 10/04/2008   Left post leg  . Squamous cell carcinoma of skin 12/01/2008   Left lower leg  . Squamous cell carcinoma of skin 03/08/2009   Right ant. mid lower leg, Right lower leg  . Squamous cell carcinoma of skin 05/17/2009   Left lower post. leg superior  . Squamous cell carcinoma of skin 10/11/2009   Left lower leg. KA-pattern  . Squamous cell carcinoma of skin 03/28/2010   Right lower leg  . Squamous cell carcinoma of skin 03/25/2013   Right lower leg  . Squamous cell carcinoma of skin 06/23/2013   Right post. lower leg. Right medial lower leg. Left medial lower leg  . Squamous cell carcinoma of skin 09/09/2013   Left anterior upper arm. Right anterior lower leg. Right lat. lower leg. Right below knee lower leg  . Squamous cell carcinoma of skin 12/08/2013   Right posterior lower leg sup. Right forearm  . Squamous cell carcinoma of skin 03/03/2014   Right posterior leg. Right med. lower leg  . Squamous cell carcinoma of skin 06/22/2014   Right to of shoulder anterior. Right top of shoulder posterior  . Squamous cell carcinoma of skin 08/03/2014   Right anterior forearm  . Squamous cell carcinoma of skin 09/14/2014   Right chest  . Squamous cell carcinoma of skin 10/30/2014   Right calf  . Squamous cell carcinoma of skin 12/14/2014   Right med. distal pretibial sup. Right med distal pretibial inf. Left proximal bicep sup. Left proximal bicep inf.  . Squamous cell carcinoma of skin 03/01/2015   Left lat proximal pretibial near knee  . Squamous cell carcinoma of skin 03/29/2015   Left mid to prox. pretibial. Left mid. lat. pretibial.   . Squamous cell carcinoma of skin 04/19/2015   Right mid calf. Right distal lat. pretibial  . Squamous  cell carcinoma of skin 05/31/2015   Right proximal med. pretibial ant. Right proximal med. pretibial posterior  . Squamous cell carcinoma of skin 07/16/2015   Right upper arm inf. deltoid  . Squamous cell carcinoma of skin 08/06/2015   Left distal pretibial  . Squamous cell carcinoma of skin 09/12/2015   Left postauricular area  . Squamous cell carcinoma of skin 12/12/2015   Left proximal pretibial x4 sites  . Squamous cell carcinoma of skin 12/31/2015   Right med. lower leg above ankle sup. Right med. lower leg above ankle inf. Left inf. lat. calf med. Left inf. calf lat.  . Squamous cell carcinoma of skin 01/31/2016   Right med. sup. ankle  area. Right med mid proximal calf. Left dorsum foot.   . Squamous cell carcinoma of skin 03/17/2016   Left post. distal calf lateral. Left post distal calf med. Left mid lat ant. thigh. Right mid lat. calf  . Squamous cell carcinoma of skin 05/21/2016   Right proximal medial pretibial. Right mid med. pretibial.  . Squamous cell carcinoma of skin 07/03/2016   Right mid med. pretibial. Right proximal med. pretibial  . Squamous cell carcinoma of skin 08/20/2016   Right medial calf. Left distal pretibial. Right upper back paraspinal  . Squamous cell carcinoma of skin 10/30/2016   Right medial calf x2  . Squamous cell carcinoma of skin 12/10/2016   left mid back 6.0cm lat to spine. Left sup. lat. calf. Left inf. post. calf  . Squamous cell carcinoma of skin 12/25/2016   Right medial above ankle. Right mid pretibial. Right prox. pretibial. Right prox. pretibial med.   . Squamous cell carcinoma of skin 01/19/2017   Left forearm. Left ant. neck  . Squamous cell carcinoma of skin 03/23/2017   Left med. pretibial below knee  . Squamous cell carcinoma of skin 04/13/2017   Right lat. sup. ankle. Right medial ankle  . Squamous cell carcinoma of skin 04/27/2017   Left med. distal prox. calf. Left medial distal calf. Left lat. distal prox. calf. Left lat  distal distal calf  . Squamous cell carcinoma of skin 05/14/2017   Left bicep. Left dorsum mid forearm  . Squamous cell carcinoma of skin 06/25/2017   Right med. distal calf x3  . Squamous cell carcinoma of skin 09/02/2017   Left med. infraclavicular  . Squamous cell carcinoma of skin 12/21/2017   Left prox. med. lower leg. Right forearm. Left forearm. Left bicep  . Squamous cell carcinoma of skin 01/07/2018   Left dorsum foot prox. Left dorsum foot distal. Left mid to distal calf. Right chest  . Squamous cell carcinoma of skin 03/08/2018   Left bicep. Right proximal dorsum forearm. Right distal med. pretibial sup. Right distal med pretibial inf.   . Squamous cell carcinoma of skin 04/05/2018   Right sup. med scapula  . Squamous cell carcinoma of skin 06/03/2018   Left med. ankle sup. Left med. ankle inf. Left lateral ankle.  Marland Kitchen Squamous cell carcinoma of skin 09/01/2018   Right mid lat. calf. Right lat. knee. Prox post calf  . Squamous cell carcinoma of skin 11/01/2018   Left lateral ankle  . Squamous cell carcinoma of skin 12/08/2018   Right lateral ankle lat malleolus  . Squamous cell carcinoma of skin 01/05/2019   Right lateral elbow. Left mid medial calf. Left prox. ant. thigh. Right distal lat tricep  . Squamous cell carcinoma of skin 01/19/2019   Right popliteal  . Squamous cell carcinoma of skin 02/03/2019   Left mid lat calf. Left mid calf. Left dorsum lat distal foot. Left dorsum distal foot  . Squamous cell carcinoma of skin 03/30/2019   Right lower leg above med ankle ant. Right lower leg above ankle sup. Right lower leg above the med ankle inf.  . Squamous cell carcinoma of skin 05/12/2019   Right chest medial. Left deltoid  . Squamous cell carcinoma of skin 06/23/2019   Right ant thigh distal above knee. Left med distal thigh. Left ant thigh above knee  . Squamous cell carcinoma of skin 07/20/2019   Right chest. Right dorsal hand.  . Squamous cell carcinoma of skin  08/03/2019   Left distal lat pretibial.  Left distal med calf. Right prox. lat calf  . Squamous cell carcinoma of skin 09/05/2019   Right middle bicep. Right sup lat bicep. Right sup mid bicep. Right sup med bicep  . Squamous cell carcinoma of skin 09/15/2019   Right proximal med pretibial. Left distal lat thigh  . Squamous cell carcinoma of skin 04/02/2020   Right calf, bleow right medial knee sup, below right medial knee post, below right medial knee inf    Family History: Family History  Problem Relation Age of Onset  . Breast cancer Sister 41  . Atrial fibrillation Mother     Social History   Socioeconomic History  . Marital status: Married    Spouse name: Not on file  . Number of children: Not on file  . Years of education: Not on file  . Highest education level: Not on file  Occupational History  . Not on file  Tobacco Use  . Smoking status: Never Smoker  . Smokeless tobacco: Never Used  Vaping Use  . Vaping Use: Never used  Substance and Sexual Activity  . Alcohol use: No  . Drug use: No  . Sexual activity: Not on file  Other Topics Concern  . Not on file  Social History Narrative  . Not on file   Social Determinants of Health   Financial Resource Strain:   . Difficulty of Paying Living Expenses: Not on file  Food Insecurity:   . Worried About Charity fundraiser in the Last Year: Not on file  . Ran Out of Food in the Last Year: Not on file  Transportation Needs:   . Lack of Transportation (Medical): Not on file  . Lack of Transportation (Non-Medical): Not on file  Physical Activity:   . Days of Exercise per Week: Not on file  . Minutes of Exercise per Session: Not on file  Stress:   . Feeling of Stress : Not on file  Social Connections:   . Frequency of Communication with Friends and Family: Not on file  . Frequency of Social Gatherings with Friends and Family: Not on file  . Attends Religious Services: Not on file  . Active Member of Clubs or  Organizations: Not on file  . Attends Archivist Meetings: Not on file  . Marital Status: Not on file  Intimate Partner Violence:   . Fear of Current or Ex-Partner: Not on file  . Emotionally Abused: Not on file  . Physically Abused: Not on file  . Sexually Abused: Not on file      Review of Systems  Constitutional: Negative for chills, fatigue and unexpected weight change.  HENT: Negative for congestion, rhinorrhea, sneezing and sore throat.   Eyes: Negative for photophobia, pain and redness.  Respiratory: Negative for cough, chest tightness and shortness of breath.   Cardiovascular: Negative for chest pain and palpitations.  Gastrointestinal: Negative for abdominal pain, constipation, diarrhea, nausea and vomiting.  Endocrine: Negative.   Genitourinary: Negative for dysuria and frequency.  Musculoskeletal: Negative for arthralgias, back pain, joint swelling and neck pain.  Skin: Negative for rash.  Allergic/Immunologic: Negative.   Neurological: Negative for tremors and numbness.  Hematological: Negative for adenopathy. Does not bruise/bleed easily.  Psychiatric/Behavioral: Negative for behavioral problems and sleep disturbance. The patient is not nervous/anxious.     Vital Signs: BP 118/78   Pulse 79   Temp (!) 96.9 F (36.1 C)   Resp 16   Ht 5\' 2"  (1.575 m)   Wt 108  lb (49 kg)   SpO2 98%   BMI 19.75 kg/m    Physical Exam Vitals and nursing note reviewed.  Constitutional:      General: She is not in acute distress.    Appearance: She is well-developed. She is not diaphoretic.  HENT:     Head: Normocephalic and atraumatic.     Mouth/Throat:     Pharynx: No oropharyngeal exudate.  Eyes:     Pupils: Pupils are equal, round, and reactive to light.  Neck:     Thyroid: No thyromegaly.     Vascular: No JVD.     Trachea: No tracheal deviation.  Cardiovascular:     Rate and Rhythm: Normal rate and regular rhythm.     Heart sounds: Normal heart sounds. No  murmur heard.  No friction rub. No gallop.   Pulmonary:     Effort: Pulmonary effort is normal. No respiratory distress.     Breath sounds: Normal breath sounds. No wheezing or rales.  Chest:     Chest wall: No tenderness.  Abdominal:     Palpations: Abdomen is soft.     Tenderness: There is no abdominal tenderness. There is no guarding.  Musculoskeletal:        General: Normal range of motion.     Cervical back: Normal range of motion and neck supple.  Lymphadenopathy:     Cervical: No cervical adenopathy.  Skin:    General: Skin is warm and dry.  Neurological:     Mental Status: She is alert and oriented to person, place, and time.     Cranial Nerves: No cranial nerve deficit.  Psychiatric:        Behavior: Behavior normal.        Thought Content: Thought content normal.        Judgment: Judgment normal.     Assessment/Plan: 1. Elevated serum creatinine Repeat US for cyst surveillance, due to increased creatinine. - US Renal; Future  2. B12 deficiency - cyanocobalamin ((VITAMIN B-12)) injection 1,000 mcg  3. Hypertension, unspecified type Stable, continue current management  4. Hyperlipidemia, unspecified hyperlipidemia type Lipid panel is stable continue medications.   General Counseling: Salene verbalizes understanding of the findings of todays visit and agrees with plan of treatment. I have discussed any further diagnostic evaluation that may be needed or ordered today. We also reviewed her medications today. she has been encouraged to call the office with any questions or concerns that should arise related to todays visit.    Orders Placed This Encounter  Procedures  . US Renal    Meds ordered this encounter  Medications  . cyanocobalamin ((VITAMIN B-12)) injection 1,000 mcg    Time spent: 25 Minutes   This patient was seen by Orson Gear AGNP-C in Collaboration with Dr Lavera Guise as a part of collaborative care agreement     Kendell Bane  AGNP-C Internal medicine

## 2020-04-10 ENCOUNTER — Inpatient Hospital Stay: Payer: PPO | Attending: Oncology | Admitting: Oncology

## 2020-04-10 ENCOUNTER — Inpatient Hospital Stay: Payer: PPO

## 2020-04-10 VITALS — BP 143/67 | HR 66 | Temp 97.6°F | Resp 16 | Wt 108.4 lb

## 2020-04-10 DIAGNOSIS — L570 Actinic keratosis: Secondary | ICD-10-CM | POA: Diagnosis not present

## 2020-04-10 DIAGNOSIS — Z803 Family history of malignant neoplasm of breast: Secondary | ICD-10-CM

## 2020-04-10 DIAGNOSIS — C4492 Squamous cell carcinoma of skin, unspecified: Secondary | ICD-10-CM

## 2020-04-10 NOTE — Progress Notes (Signed)
Hematology/Oncology Consult note [facility] Telephone:([phone] [phone]   Patient Care Team: Lavera Guise, MD as PCP - General (Internal Medicine)  REFERRING PROVIDER: Ralene Bathe, MD  CHIEF COMPLAINTS/REASON FOR VISIT:  Evaluation of squamous cell carcinoma  HISTORY OF PRESENTING ILLNESS:   Joann Warren is a  74 y.o.  female with PMH listed below was seen in consultation at the request of  Ralene Bathe, MD  for evaluation of squamous cell carcinoma. 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  She has been through multiple surgery as well as local radiation. Patient has had persistently occurrence of new lesions and has been on treatment every couple of weeks. Patient has also have done field treatment with photodynamic therapy with Levulan as well as 5-FU chemo swabs.  Due to the persistent occurrence of new lesions, patient was referred to establish care with me to see if he may be a candidate for oral capecitabine or other agent that patient may benefit from.  Per my discussion with patient, she is not aware about any lesion that is refractory to the treatment and recurred at the same spot.  She has actinic keratosis and form new SCC lesions.  Patient has had over 50 treatments of multiple SCC lesions. Patient also has a history of basal cell carcinoma and has received multiple surgical resections.   Review of Systems  Constitutional: Negative for appetite change, chills, fatigue and fever.  HENT:   Negative for hearing loss and voice change.   Eyes: Negative for eye problems.  Respiratory: Negative for chest tightness and cough.   Cardiovascular: Negative for chest pain.  Gastrointestinal: Negative for abdominal distention, abdominal pain and blood in stool.  Endocrine: Negative for hot flashes.  Genitourinary: Negative for difficulty  urinating and frequency.   Musculoskeletal: Negative for arthralgias.  Skin:       Multiple resection of skin cancer.  Neurological: Negative for extremity weakness.  Hematological: Negative for adenopathy.  Psychiatric/Behavioral: Negative for confusion.    MEDICAL HISTORY:  Past Medical History:  Diagnosis Date  . Actinic keratosis   . Anemia   . Basal cell carcinoma 12/23/2007   Upper back.  . Basal cell carcinoma 05/17/2009   Right med lower leg  . Basal cell carcinoma 07/06/2014   Left medial breast  . Basal cell carcinoma 03/01/2015   Right medial mid pretibial  . Basal cell carcinoma 07/16/2015   Right superior breast  . Basal cell carcinoma 05/21/2016   Left inferior medial breast  . Basal cell carcinoma 02/08/2018   Left medial breast  . Basal cell carcinoma 11/01/2018   Left medial breast  . Basal cell carcinoma 03/30/2019   Left med inf breast  . Basal cell carcinoma 09/05/2019   Left proximal bicep  . Basal cell carcinoma 12/08/2019   Right forehead. Nodular pattern.  . Basal cell carcinoma 12/13/2019   Left med. breast inf. Nodular and infiltrative patterns. EDC  . Basal cell carcinoma 12/13/2019   Left med. breast. sup. Nodular pattern. EDC  . Cancer (HCC)    squamous cell  . Glaucoma   . Hyperlipidemia   . Hypertension   . Osteoporosis   . Squamous cell carcinoma of skin 12/08/2007   Left lower leg. WD SCC  . Squamous cell carcinoma of skin 02/01/2008   Left distal med. pretibial. WD SCC  . Squamous cell carcinoma of  skin 02/01/2008   Right mid pretibial. WD SCC  . Squamous cell carcinoma of skin 02/01/2008   Right mid pretibal inferior. WD SCC with superficial infiltration.  . Squamous cell carcinoma of skin 04/05/2008   Left dorsum hand. WD SCC with superficial infiltration.  . Squamous cell carcinoma of skin 06/15/2008   Right lower leg inferior. WD SCC  . Squamous cell carcinoma of skin 06/15/2008   Right lower leg superior. SCCis  hypertrophic  . Squamous cell carcinoma of skin 07/05/2008   Right lat. lower leg, Right lower leg, Left lower leg  . Squamous cell carcinoma of skin 08/04/2008   Right ant. lower leg, Left med. lower leg  . Squamous cell carcinoma of skin 09/01/2008   Right post. lower leg  . Squamous cell carcinoma of skin 10/04/2008   Left post leg  . Squamous cell carcinoma of skin 12/01/2008   Left lower leg  . Squamous cell carcinoma of skin 03/08/2009   Right ant. mid lower leg, Right lower leg  . Squamous cell carcinoma of skin 05/17/2009   Left lower post. leg superior  . Squamous cell carcinoma of skin 10/11/2009   Left lower leg. KA-pattern  . Squamous cell carcinoma of skin 03/28/2010   Right lower leg  . Squamous cell carcinoma of skin 03/25/2013   Right lower leg  . Squamous cell carcinoma of skin 06/23/2013   Right post. lower leg. Right medial lower leg. Left medial lower leg  . Squamous cell carcinoma of skin 09/09/2013   Left anterior upper arm. Right anterior lower leg. Right lat. lower leg. Right below knee lower leg  . Squamous cell carcinoma of skin 12/08/2013   Right posterior lower leg sup. Right forearm  . Squamous cell carcinoma of skin 03/03/2014   Right posterior leg. Right med. lower leg  . Squamous cell carcinoma of skin 06/22/2014   Right to of shoulder anterior. Right top of shoulder posterior  . Squamous cell carcinoma of skin 08/03/2014   Right anterior forearm  . Squamous cell carcinoma of skin 09/14/2014   Right chest  . Squamous cell carcinoma of skin 10/30/2014   Right calf  . Squamous cell carcinoma of skin 12/14/2014   Right med. distal pretibial sup. Right med distal pretibial inf. Left proximal bicep sup. Left proximal bicep inf.  . Squamous cell carcinoma of skin 03/01/2015   Left lat proximal pretibial near knee  . Squamous cell carcinoma of skin 03/29/2015   Left mid to prox. pretibial. Left mid. lat. pretibial.   . Squamous cell carcinoma of  skin 04/19/2015   Right mid calf. Right distal lat. pretibial  . Squamous cell carcinoma of skin 05/31/2015   Right proximal med. pretibial ant. Right proximal med. pretibial posterior  . Squamous cell carcinoma of skin 07/16/2015   Right upper arm inf. deltoid  . Squamous cell carcinoma of skin 08/06/2015   Left distal pretibial  . Squamous cell carcinoma of skin 09/12/2015   Left postauricular area  . Squamous cell carcinoma of skin 12/12/2015   Left proximal pretibial x4 sites  . Squamous cell carcinoma of skin 12/31/2015   Right med. lower leg above ankle sup. Right med. lower leg above ankle inf. Left inf. lat. calf med. Left inf. calf lat.  . Squamous cell carcinoma of skin 01/31/2016   Right med. sup. ankle area. Right med mid proximal calf. Left dorsum foot.   . Squamous cell carcinoma of skin 03/17/2016   Left post. distal calf lateral.  Left post distal calf med. Left mid lat ant. thigh. Right mid lat. calf  . Squamous cell carcinoma of skin 05/21/2016   Right proximal medial pretibial. Right mid med. pretibial.  . Squamous cell carcinoma of skin 07/03/2016   Right mid med. pretibial. Right proximal med. pretibial  . Squamous cell carcinoma of skin 08/20/2016   Right medial calf. Left distal pretibial. Right upper back paraspinal  . Squamous cell carcinoma of skin 10/30/2016   Right medial calf x2  . Squamous cell carcinoma of skin 12/10/2016   left mid back 6.0cm lat to spine. Left sup. lat. calf. Left inf. post. calf  . Squamous cell carcinoma of skin 12/25/2016   Right medial above ankle. Right mid pretibial. Right prox. pretibial. Right prox. pretibial med.   . Squamous cell carcinoma of skin 01/19/2017   Left forearm. Left ant. neck  . Squamous cell carcinoma of skin 03/23/2017   Left med. pretibial below knee  . Squamous cell carcinoma of skin 04/13/2017   Right lat. sup. ankle. Right medial ankle  . Squamous cell carcinoma of skin 04/27/2017   Left med. distal  prox. calf. Left medial distal calf. Left lat. distal prox. calf. Left lat distal distal calf  . Squamous cell carcinoma of skin 05/14/2017   Left bicep. Left dorsum mid forearm  . Squamous cell carcinoma of skin 06/25/2017   Right med. distal calf x3  . Squamous cell carcinoma of skin 09/02/2017   Left med. infraclavicular  . Squamous cell carcinoma of skin 12/21/2017   Left prox. med. lower leg. Right forearm. Left forearm. Left bicep  . Squamous cell carcinoma of skin 01/07/2018   Left dorsum foot prox. Left dorsum foot distal. Left mid to distal calf. Right chest  . Squamous cell carcinoma of skin 03/08/2018   Left bicep. Right proximal dorsum forearm. Right distal med. pretibial sup. Right distal med pretibial inf.   . Squamous cell carcinoma of skin 04/05/2018   Right sup. med scapula  . Squamous cell carcinoma of skin 06/03/2018   Left med. ankle sup. Left med. ankle inf. Left lateral ankle.  Marland Kitchen Squamous cell carcinoma of skin 09/01/2018   Right mid lat. calf. Right lat. knee. Prox post calf  . Squamous cell carcinoma of skin 11/01/2018   Left lateral ankle  . Squamous cell carcinoma of skin 12/08/2018   Right lateral ankle lat malleolus  . Squamous cell carcinoma of skin 01/05/2019   Right lateral elbow. Left mid medial calf. Left prox. ant. thigh. Right distal lat tricep  . Squamous cell carcinoma of skin 01/19/2019   Right popliteal  . Squamous cell carcinoma of skin 02/03/2019   Left mid lat calf. Left mid calf. Left dorsum lat distal foot. Left dorsum distal foot  . Squamous cell carcinoma of skin 03/30/2019   Right lower leg above med ankle ant. Right lower leg above ankle sup. Right lower leg above the med ankle inf.  . Squamous cell carcinoma of skin 05/12/2019   Right chest medial. Left deltoid  . Squamous cell carcinoma of skin 06/23/2019   Right ant thigh distal above knee. Left med distal thigh. Left ant thigh above knee  . Squamous cell carcinoma of skin  07/20/2019   Right chest. Right dorsal hand.  . Squamous cell carcinoma of skin 08/03/2019   Left distal lat pretibial. Left distal med calf. Right prox. lat calf  . Squamous cell carcinoma of skin 09/05/2019   Right middle bicep. Right sup lat bicep.  Right sup mid bicep. Right sup med bicep  . Squamous cell carcinoma of skin 09/15/2019   Right proximal med pretibial. Left distal lat thigh  . Squamous cell carcinoma of skin 04/02/2020   Right calf, bleow right medial knee sup, below right medial knee post, below right medial knee inf    SURGICAL HISTORY: Past Surgical History:  Procedure Laterality Date  . BREAST EXCISIONAL BIOPSY Left 1972   neg  . BREAST EXCISIONAL BIOPSY Right 1980   neg  . COLONOSCOPY    . ESOPHAGOGASTRODUODENOSCOPY (EGD) WITH PROPOFOL N/A 04/09/2015   Procedure: ESOPHAGOGASTRODUODENOSCOPY (EGD) WITH PROPOFOL;  Surgeon: Manya Silvas, MD;  Location[facility] ENDOSCOPY;  Service: Endoscopy;  Laterality: N/A;  . skin cancer removal      SOCIAL HISTORY: Social History   Socioeconomic History  . Marital status: Married    Spouse name: Not on file  . Number of children: Not on file  . Years of education: Not on file  . Highest education level: Not on file  Occupational History  . Not on file  Tobacco Use  . Smoking status: Never Smoker  . Smokeless tobacco: Never Used  Vaping Use  . Vaping Use: Never used  Substance and Sexual Activity  . Alcohol use: No  . Drug use: No  . Sexual activity: Not on file  Other Topics Concern  . Not on file  Social History Narrative  . Not on file   Social Determinants of Health   Financial Resource Strain:   . Difficulty of Paying Living Expenses: Not on file  Food Insecurity:   . Worried About Charity fundraiser in the Last Year: Not on file  . Ran Out of Food in the Last Year: Not on file  Transportation Needs:   . Lack of Transportation (Medical): Not on file  . Lack of Transportation (Non-Medical): Not on  file  Physical Activity:   . Days of Exercise per Week: Not on file  . Minutes of Exercise per Session: Not on file  Stress:   . Feeling of Stress : Not on file  Social Connections:   . Frequency of Communication with Friends and Family: Not on file  . Frequency of Social Gatherings with Friends and Family: Not on file  . Attends Religious Services: Not on file  . Active Member of Clubs or Organizations: Not on file  . Attends Archivist Meetings: Not on file  . Marital Status: Not on file  Intimate Partner Violence:   . Fear of Current or Ex-Partner: Not on file  . Emotionally Abused: Not on file  . Physically Abused: Not on file  . Sexually Abused: Not on file    FAMILY HISTORY: Family History  Problem Relation Age of Onset  . Breast cancer Sister 10  . Atrial fibrillation Mother     ALLERGIES:  is allergic to neomycin-bacitracin zn-polymyx.  MEDICATIONS:  Current Outpatient Medications  Medication Sig Dispense Refill  . alendronate (FOSAMAX) 70 MG tablet TAKE 1 TABLET EVERY 7 DAYS WITH A FULL GLASS OF WATER ON AN EMPTY STOMACH DO NOT LIE DOWN FOR AT LEAST 30 MIN 12 tablet 4  . amLODipine (NORVASC) 5 MG tablet Take 1 tablet (5 mg total) by mouth daily. 90 tablet 1  . bisoprolol-hydrochlorothiazide (ZIAC) 2.5-6.25 MG tablet TAKE ONE TABLET EVERY MORNING 90 tablet 1  . hydrocortisone 2.5 % cream APPLY TO AFFECTED AREAS ON THE SKIN EVERYDAY FOR UP TO 5 DAYS PER WEEK    .  hydrOXYzine (ATARAX/VISTARIL) 10 MG tablet Take 10 mg by mouth 3 (three) times daily as needed.    . simvastatin (ZOCOR) 20 MG tablet Take 1 tablet (20 mg total) by mouth at bedtime. 90 tablet 1  . bimatoprost (LUMIGAN) 0.03 % ophthalmic solution  (Patient not taking: Reported on 04/09/2020)    . mupirocin ointment (BACTROBAN) 2 % Apply topically daily. (Patient not taking: Reported on 04/09/2020) 22 g 2  . triamcinolone cream (KENALOG) 0.1 % Apply 1 application topically daily as needed. (Patient not  taking: Reported on 04/09/2020) 453.6 g 11   No current facility-administered medications for this visit.     PHYSICAL EXAMINATION: ECOG PERFORMANCE STATUS: 0 - Asymptomatic Vitals:   04/10/20 1037  BP: (!) 143/67  Pulse: 66  Resp: 16  Temp: 97.6 F (36.4 C)   Filed Weights   04/10/20 1037  Weight: 108 lb 6.4 oz (49.2 kg)    Physical Exam Constitutional:      General: She is not in acute distress. HENT:     Head: Normocephalic and atraumatic.  Eyes:     General: No scleral icterus. Cardiovascular:     Rate and Rhythm: Normal rate and regular rhythm.     Heart sounds: Normal heart sounds.  Pulmonary:     Effort: Pulmonary effort is normal. No respiratory distress.     Breath sounds: No wheezing.  Abdominal:     General: Bowel sounds are normal. There is no distension.     Palpations: Abdomen is soft.  Musculoskeletal:        General: No deformity. Normal range of motion.     Cervical back: Normal range of motion and neck supple.  Skin:    General: Skin is warm and dry.     Findings: No erythema or rash.     Comments: Patient has chronic skin changes due to multiple previous surgery/radiation/5-FU treatment for SCC.  Currently she has recent treatment on an area on right calf and an area anteriorly below her knee right knee.  Covered with dressing.  Neurological:     Mental Status: She is alert and oriented to person, place, and time. Mental status is at baseline.     Cranial Nerves: No cranial nerve deficit.     Coordination: Coordination normal.  Psychiatric:        Mood and Affect: Mood normal.     LABORATORY DATA:  I have reviewed the data as listed Lab Results  Component Value Date   WBC 8.7 03/27/2020   HGB 12.8 03/27/2020   HCT 38.9 03/27/2020   MCV 89 03/27/2020   PLT 248 03/27/2020   Recent Labs    05/19/19 1308 08/08/19 1014 03/27/20 1022  NA 141  --  140  K 4.4  --  5.1  CL 104  --  98  CO2 25  --  27  GLUCOSE 96  --  102*  BUN 15  --   12  CREATININE 1.06* 1.20* 1.21*  CALCIUM 9.3  --  9.9  GFRNONAA 52*  --  44*  GFRAA 60  --  51*  PROT  --   --  7.3  ALBUMIN 4.3  --  4.6  AST  --   --  15  ALT  --   --  8  ALKPHOS  --   --  70  BILITOT  --   --  0.4   Iron/TIBC/Ferritin/ %Sat    Component Value Date/Time   IRON 71 03/27/2020  1022   TIBC 256 03/27/2020 1022   FERRITIN 83 03/27/2020 1022   IRONPCTSAT 28 03/27/2020 1022      RADIOGRAPHIC STUDIES: I have personally reviewed the radiological images as listed and agreed with the findings in the report. MM 3D SCREEN BREAST BILATERAL  Result Date: 03/29/2020 CLINICAL DATA:  Screening. EXAM: DIGITAL SCREENING BILATERAL MAMMOGRAM WITH TOMO AND CAD COMPARISON:  Previous exam(s). ACR Breast Density Category c: The breast tissue is heterogeneously dense, which may obscure small masses. FINDINGS: There are no findings suspicious for malignancy. Images were processed with CAD. IMPRESSION: No mammographic evidence of malignancy. A result letter of this screening mammogram will be mailed directly to the patient. RECOMMENDATION: Screening mammogram in one year. (Code:SM-B-01Y) BI-RADS CATEGORY  1: Negative. Electronically Signed   By: Audie Pinto M.D.   On: 03/29/2020 11:24      ASSESSMENT & PLAN:  1. Primary skin squamous cell carcinoma    I discussed with patient and her husband that systemic chemoprevention does not have an established role in the prevention of cutaneous squamous cell carcinoma.  Oral retinoid may have some benefit in selecting high risk patient however the side effects he had with prolonged use. Capecitabine may reduce development of new cutaneous SCC's and solid organ transplant recipients in 2 small cases series.  Additional studies are necessary to determine the efficacy and safety of capecitabine in this setting.  Given the potential systemic risk and side effects, I would not recommend use in non-transplant setting. Should she develop any locally  advanced lesion that is refractory to surgery/radiation, or in metastatic setting, she may be a candidate for immunotherapy, i.e.cemiplimab.  Currently I think her best option is to proceed with regular screening and surgical treatment for precancer and cancer lesions.  In combination with radiation treatment. Oral retinoids, nicotinamide,  may be effective in reducing development of AK and SCC,   Multiple primary cutaneous SCC's, likely secondary to sun exposure. I would recommend patient to have screening testing any immunocompromised state including HIV, hepatitis. She may consider to discuss with genetic counselor for checking any genetic syndromes that may predispose her to [facility] formation.  All questions were answered. The patient knows to call the clinic with any problems questions or concerns.  cc Ralene Bathe, MD   Thank you for this kind referral and the opportunity to participate in the care of this patient. A copy of today's note is routed to referring provider    Earlie Server, MD, PhD Hematology Oncology [facility] at [facility] Pager- [phone] 04/10/2020

## 2020-04-11 ENCOUNTER — Telehealth: Payer: Self-pay

## 2020-04-11 DIAGNOSIS — C4492 Squamous cell carcinoma of skin, unspecified: Secondary | ICD-10-CM

## 2020-04-11 NOTE — Telephone Encounter (Signed)
Patient informed and accepts lab appt for tomorrow at 1:45. Joann Warren can you please add lab appt. Thanks

## 2020-04-11 NOTE — Telephone Encounter (Signed)
Done. Lab appt on 04/12/20 @ 145 has been scheduled as requested

## 2020-04-11 NOTE — Telephone Encounter (Signed)
-----   Message from Earlie Server, MD sent at 04/10/2020  5:15 PM EDT ----- Please let her know that I recommend her to check immunocompromised state to see if anything may contribute to her multiple skin cancer. Of course, most likely skin cancer is due to previous sun exposure.  Check HIV and hepatitis B.  She has hep c checked recently. She can check either here or get it done via pcp for her next blood work.

## 2020-04-11 NOTE — Addendum Note (Signed)
Addended by: Vanice Sarah on: 04/11/2020 03:14 PM   Modules accepted: Orders

## 2020-04-12 ENCOUNTER — Other Ambulatory Visit: Payer: Self-pay

## 2020-04-12 ENCOUNTER — Inpatient Hospital Stay: Payer: PPO

## 2020-04-12 DIAGNOSIS — C4492 Squamous cell carcinoma of skin, unspecified: Secondary | ICD-10-CM

## 2020-04-12 LAB — HIV ANTIBODY (ROUTINE TESTING W REFLEX): HIV Screen 4th Generation wRfx: NONREACTIVE

## 2020-04-12 LAB — HEPATITIS B SURFACE ANTIGEN: Hepatitis B Surface Ag: NONREACTIVE

## 2020-04-13 LAB — HEPATITIS B SURFACE ANTIBODY, QUANTITATIVE: Hep B S AB Quant (Post): <3.1 m[IU]/mL — ABNORMAL LOW (ref >9.9)

## 2020-04-16 ENCOUNTER — Other Ambulatory Visit: Payer: Self-pay | Admitting: Internal Medicine

## 2020-04-16 ENCOUNTER — Telehealth: Payer: Self-pay

## 2020-04-16 ENCOUNTER — Other Ambulatory Visit: Payer: Self-pay | Admitting: Dermatology

## 2020-04-16 NOTE — Telephone Encounter (Signed)
Patient is active in [place].  Message sent via [place].

## 2020-04-16 NOTE — Telephone Encounter (Signed)
-----   Message from Earlie Server, MD sent at 04/14/2020  9:52 AM EDT ----- Negative HIV and hepatitis panel. No change on the plan.

## 2020-04-25 ENCOUNTER — Telehealth: Payer: Self-pay

## 2020-04-25 NOTE — Telephone Encounter (Signed)
Confirmed and screened for 04-27-20 ov.

## 2020-04-27 ENCOUNTER — Ambulatory Visit: Payer: PPO

## 2020-04-27 DIAGNOSIS — R7989 Other specified abnormal findings of blood chemistry: Secondary | ICD-10-CM

## 2020-05-11 ENCOUNTER — Other Ambulatory Visit: Payer: Self-pay

## 2020-05-11 ENCOUNTER — Ambulatory Visit (INDEPENDENT_AMBULATORY_CARE_PROVIDER_SITE_OTHER): Payer: PPO

## 2020-05-11 DIAGNOSIS — E538 Deficiency of other specified B group vitamins: Secondary | ICD-10-CM

## 2020-05-11 MED ORDER — CYANOCOBALAMIN 1000 MCG/ML IJ SOLN
1000.0000 ug | Freq: Once | INTRAMUSCULAR | Status: AC
  Administered 2020-05-11: 1000 ug via INTRAMUSCULAR

## 2020-05-14 ENCOUNTER — Ambulatory Visit: Payer: PPO | Admitting: Dermatology

## 2020-05-14 ENCOUNTER — Other Ambulatory Visit: Payer: Self-pay

## 2020-05-14 DIAGNOSIS — L820 Inflamed seborrheic keratosis: Secondary | ICD-10-CM | POA: Diagnosis not present

## 2020-05-14 DIAGNOSIS — C44519 Basal cell carcinoma of skin of other part of trunk: Secondary | ICD-10-CM | POA: Diagnosis not present

## 2020-05-14 DIAGNOSIS — L821 Other seborrheic keratosis: Secondary | ICD-10-CM

## 2020-05-14 DIAGNOSIS — L578 Other skin changes due to chronic exposure to nonionizing radiation: Secondary | ICD-10-CM

## 2020-05-14 DIAGNOSIS — D485 Neoplasm of uncertain behavior of skin: Secondary | ICD-10-CM

## 2020-05-14 DIAGNOSIS — L570 Actinic keratosis: Secondary | ICD-10-CM

## 2020-05-14 DIAGNOSIS — C44622 Squamous cell carcinoma of skin of right upper limb, including shoulder: Secondary | ICD-10-CM | POA: Diagnosis not present

## 2020-05-14 NOTE — Progress Notes (Signed)
Follow-Up Visit   Subjective  Joann Warren is a 74 y.o. female who presents for the following: Lesions (upper thigh, R shoulder - irregular, irrritated skin lesions that patient is concerned about and would like treated).  The following portions of the chart were reviewed this encounter and updated as appropriate:  Tobacco   Allergies   Meds   Problems   Med Hx   Surg Hx   Fam Hx      Review of Systems:  No other skin or systemic complaints except as noted in HPI or Assessment and Plan.  Objective  Well appearing patient in no apparent distress; mood and affect are within normal limits.  A focused examination was performed including the trunk and extremities. Relevant physical exam findings are noted in the Assessment and Plan.  Objective  Lower legs (18): Erythematous thin papules/macules with gritty scale.   Objective  legs and trunk (17): Erythematous keratotic or waxy stuck-on papule or plaque.   Objective  Right Upper Back: 1.4 cm crusted papules   Objective  Right Shoulder - Anterior: 1.7 cm crusted papules   Assessment & Plan  AK (actinic keratosis) (18) Lower legs  Destruction of lesion - Lower legs Complexity: simple   Destruction method: cryotherapy   Informed consent: discussed and consent obtained   Timeout:  patient name, date of birth, surgical site, and procedure verified Lesion destroyed using liquid nitrogen: Yes   Region frozen until ice ball extended beyond lesion: Yes   Outcome: patient tolerated procedure well with no complications   Post-procedure details: wound care instructions given    Inflamed seborrheic keratosis (17) legs and trunk  Destruction of lesion - legs and trunk Complexity: simple   Destruction method: cryotherapy   Informed consent: discussed and consent obtained   Timeout:  patient name, date of birth, surgical site, and procedure verified Lesion destroyed using liquid nitrogen: Yes   Region frozen until ice ball  extended beyond lesion: Yes   Outcome: patient tolerated procedure well with no complications   Post-procedure details: wound care instructions given    Neoplasm of uncertain behavior of skin (2) Right Upper Back  Epidermal / dermal shaving  Lesion diameter (cm):  1.4 Informed consent: discussed and consent obtained   Timeout: patient name, date of birth, surgical site, and procedure verified   Procedure prep:  Patient was prepped and draped in usual sterile fashion Prep type:  Isopropyl alcohol Anesthesia: the lesion was anesthetized in a standard fashion   Anesthetic:  1% lidocaine w/ epinephrine 1-100,000 buffered w/ 8.4% NaHCO3 Instrument used: flexible razor blade   Hemostasis achieved with: pressure, aluminum chloride and electrodesiccation   Outcome: patient tolerated procedure well   Post-procedure details: sterile dressing applied and wound care instructions given   Dressing type: bandage and petrolatum    Destruction of lesion Complexity: extensive   Destruction method: electrodesiccation and curettage   Informed consent: discussed and consent obtained   Timeout:  patient name, date of birth, surgical site, and procedure verified Procedure prep:  Patient was prepped and draped in usual sterile fashion Prep type:  Isopropyl alcohol Anesthesia: the lesion was anesthetized in a standard fashion   Anesthetic:  1% lidocaine w/ epinephrine 1-100,000 buffered w/ 8.4% NaHCO3 Curettage performed in three different directions: Yes   Electrodesiccation performed over the curetted area: Yes   Lesion length (cm):  1.4 Lesion width (cm):  1.4 Margin per side (cm):  0.2 Final wound size (cm):  1.8 Hemostasis achieved  with:  pressure, aluminum chloride and electrodesiccation Outcome: patient tolerated procedure well with no complications   Post-procedure details: sterile dressing applied and wound care instructions given   Dressing type: bandage and petrolatum    Specimen 1 -  Surgical pathology Differential Diagnosis: D48.5 r/o BCC vs SCC  Check Margins: No 1.4 cm crusted papules; ED&C today   Right Shoulder - Anterior  Epidermal / dermal shaving  Lesion diameter (cm):  1.7 Informed consent: discussed and consent obtained   Timeout: patient name, date of birth, surgical site, and procedure verified   Procedure prep:  Patient was prepped and draped in usual sterile fashion Prep type:  Isopropyl alcohol Anesthesia: the lesion was anesthetized in a standard fashion   Anesthetic:  1% lidocaine w/ epinephrine 1-100,000 buffered w/ 8.4% NaHCO3 Instrument used: flexible razor blade   Hemostasis achieved with: pressure, aluminum chloride and electrodesiccation   Outcome: patient tolerated procedure well   Post-procedure details: sterile dressing applied and wound care instructions given   Dressing type: bandage and petrolatum    Destruction of lesion Complexity: extensive   Destruction method: electrodesiccation and curettage   Informed consent: discussed and consent obtained   Timeout:  patient name, date of birth, surgical site, and procedure verified Procedure prep:  Patient was prepped and draped in usual sterile fashion Prep type:  Isopropyl alcohol Anesthesia: the lesion was anesthetized in a standard fashion   Anesthetic:  1% lidocaine w/ epinephrine 1-100,000 buffered w/ 8.4% NaHCO3 Curettage performed in three different directions: Yes   Electrodesiccation performed over the curetted area: Yes   Lesion length (cm):  1.7 Lesion width (cm):  1.7 Margin per side (cm):  0.2 Final wound size (cm):  2.1 Hemostasis achieved with:  pressure, aluminum chloride and electrodesiccation Outcome: patient tolerated procedure well with no complications   Post-procedure details: sterile dressing applied and wound care instructions given   Dressing type: bandage and petrolatum    Specimen 2 - Surgical pathology Differential Diagnosis: D48.5 r/o BCC vs SCC    Check Margins: No 1.7 cm crusted papules; ED&C today   Other Related Medications mupirocin ointment (BACTROBAN) 2 %   Actinic Damage - diffuse scaly erythematous macules with underlying dyspigmentation - Recommend daily broad spectrum sunscreen SPF 30+ to sun-exposed areas, reapply every 2 hours as needed.  - Call for new or changing lesions.  Seborrheic Keratoses - Stuck-on, waxy, tan-brown papules and plaques  - Discussed benign etiology and prognosis. - Observe - Call for any changes  Return in about 4 months (around 09/13/2020).  Luther Redo, CMA, am acting as scribe for Sarina Ser, MD .  Documentation: I have reviewed the above documentation for accuracy and completeness, and I agree with the above.  Sarina Ser, MD

## 2020-05-14 NOTE — Patient Instructions (Signed)

## 2020-05-21 ENCOUNTER — Encounter: Payer: Self-pay | Admitting: Dermatology

## 2020-05-21 ENCOUNTER — Telehealth: Payer: Self-pay

## 2020-05-21 NOTE — Telephone Encounter (Signed)
Pt informed of results. She had no concerns. 

## 2020-06-08 ENCOUNTER — Ambulatory Visit (INDEPENDENT_AMBULATORY_CARE_PROVIDER_SITE_OTHER): Payer: PPO

## 2020-06-08 ENCOUNTER — Other Ambulatory Visit: Payer: Self-pay

## 2020-06-08 ENCOUNTER — Other Ambulatory Visit: Payer: Self-pay | Admitting: Internal Medicine

## 2020-06-08 ENCOUNTER — Other Ambulatory Visit: Payer: Self-pay | Admitting: Adult Health

## 2020-06-08 DIAGNOSIS — E785 Hyperlipidemia, unspecified: Secondary | ICD-10-CM

## 2020-06-08 DIAGNOSIS — I10 Essential (primary) hypertension: Secondary | ICD-10-CM

## 2020-06-08 DIAGNOSIS — E538 Deficiency of other specified B group vitamins: Secondary | ICD-10-CM

## 2020-06-08 MED ORDER — CYANOCOBALAMIN 1000 MCG/ML IJ SOLN
1000.0000 ug | Freq: Once | INTRAMUSCULAR | Status: AC
  Administered 2020-06-08: 1000 ug via INTRAMUSCULAR

## 2020-06-26 ENCOUNTER — Other Ambulatory Visit: Payer: Self-pay | Admitting: Dermatology

## 2020-06-27 ENCOUNTER — Encounter: Payer: Self-pay | Admitting: Dermatology

## 2020-06-27 ENCOUNTER — Other Ambulatory Visit: Payer: Self-pay

## 2020-06-27 ENCOUNTER — Ambulatory Visit: Payer: PPO | Admitting: Dermatology

## 2020-06-27 DIAGNOSIS — L570 Actinic keratosis: Secondary | ICD-10-CM | POA: Diagnosis not present

## 2020-06-27 DIAGNOSIS — L578 Other skin changes due to chronic exposure to nonionizing radiation: Secondary | ICD-10-CM

## 2020-06-27 DIAGNOSIS — C44729 Squamous cell carcinoma of skin of left lower limb, including hip: Secondary | ICD-10-CM | POA: Diagnosis not present

## 2020-06-27 DIAGNOSIS — L821 Other seborrheic keratosis: Secondary | ICD-10-CM | POA: Diagnosis not present

## 2020-06-27 DIAGNOSIS — C44722 Squamous cell carcinoma of skin of right lower limb, including hip: Secondary | ICD-10-CM | POA: Diagnosis not present

## 2020-06-27 DIAGNOSIS — Z85828 Personal history of other malignant neoplasm of skin: Secondary | ICD-10-CM | POA: Diagnosis not present

## 2020-06-27 DIAGNOSIS — D485 Neoplasm of uncertain behavior of skin: Secondary | ICD-10-CM

## 2020-06-27 DIAGNOSIS — L820 Inflamed seborrheic keratosis: Secondary | ICD-10-CM

## 2020-06-27 MED ORDER — MUPIROCIN 2 % EX OINT: TOPICAL_OINTMENT | Freq: Every day | CUTANEOUS | 2 refills | Status: DC

## 2020-06-27 NOTE — Patient Instructions (Signed)

## 2020-06-27 NOTE — Progress Notes (Signed)
Follow-Up Visit   Subjective  Joann Warren is a 74 y.o. female who presents for the following: Lesions (3 new lesions on the R leg and one on the L that the patient is concerned may be cancer. She has also noticed irritated lesions on her L hand and L arm that she would like checked today ).  The following portions of the chart were reviewed this encounter and updated as appropriate:  Tobacco  Allergies  Meds  Problems  Med Hx  Surg Hx  Fam Hx     Review of Systems:  No other skin or systemic complaints except as noted in HPI or Assessment and Plan.  Objective  Well appearing patient in no apparent distress; mood and affect are within normal limits.  A focused examination was performed including the legs and hands. Relevant physical exam findings are noted in the Assessment and Plan.  Objective  L hand x 2 (2): Erythematous thin papules/macules with gritty scale.   Objective  L forearm x 2 (2): Erythematous keratotic or waxy stuck-on papule or plaque.   Objective  R mid to distal pretibial: 1.7 cm hyperkeratotic papule   Objective  Below the R med knee: 1.1 cm hyperkeratotic papule   Objective  R proximal pretibial: 1.1 cm hyperkeratotic papule   Objective  L distal med calf: 2.1 cm hyperkeratotic papule    Assessment & Plan  AK (actinic keratosis) (2) L hand x 2  Destruction of lesion - L hand x 2 Complexity: simple   Destruction method: cryotherapy   Informed consent: discussed and consent obtained   Timeout:  patient name, date of birth, surgical site, and procedure verified Lesion destroyed using liquid nitrogen: Yes   Region frozen until ice ball extended beyond lesion: Yes   Outcome: patient tolerated procedure well with no complications   Post-procedure details: wound care instructions given    Inflamed seborrheic keratosis (2) L forearm x 2  Destruction of lesion - L forearm x 2 Complexity: simple   Destruction method: cryotherapy     Informed consent: discussed and consent obtained   Timeout:  patient name, date of birth, surgical site, and procedure verified Lesion destroyed using liquid nitrogen: Yes   Region frozen until ice ball extended beyond lesion: Yes   Outcome: patient tolerated procedure well with no complications   Post-procedure details: wound care instructions given    Neoplasm of uncertain behavior of skin (4) R mid to distal pretibial  Epidermal / dermal shaving  Lesion diameter (cm):  1.7 Informed consent: discussed and consent obtained   Timeout: patient name, date of birth, surgical site, and procedure verified   Procedure prep:  Patient was prepped and draped in usual sterile fashion Prep type:  Isopropyl alcohol Anesthesia: the lesion was anesthetized in a standard fashion   Anesthetic:  1% lidocaine w/ epinephrine 1-100,000 buffered w/ 8.4% NaHCO3 Instrument used: flexible razor blade   Hemostasis achieved with: pressure, aluminum chloride and electrodesiccation   Outcome: patient tolerated procedure well   Post-procedure details: sterile dressing applied and wound care instructions given   Dressing type: bandage and petrolatum    Destruction of lesion Complexity: simple   Destruction method: cryotherapy   Informed consent: discussed and consent obtained   Timeout:  patient name, date of birth, surgical site, and procedure verified Lesion destroyed using liquid nitrogen: Yes   Region frozen until ice ball extended beyond lesion: Yes   Lesion length (cm):  1.7 Lesion width (cm):  1.7  Margin per side (cm):  0.2 Final wound size (cm):  2.1 Outcome: patient tolerated procedure well with no complications   Post-procedure details: wound care instructions given    Specimen 1 - Surgical pathology Differential Diagnosis: D48.5 r/o SCC ED&C today  Check Margins: No 1.7 cm hyperkeratotic papule  Below the R med knee  Epidermal / dermal shaving  Lesion diameter (cm):  1.1 Informed  consent: discussed and consent obtained   Timeout: patient name, date of birth, surgical site, and procedure verified   Procedure prep:  Patient was prepped and draped in usual sterile fashion Prep type:  Isopropyl alcohol Anesthesia: the lesion was anesthetized in a standard fashion   Anesthetic:  1% lidocaine w/ epinephrine 1-100,000 buffered w/ 8.4% NaHCO3 Instrument used: flexible razor blade   Hemostasis achieved with: pressure, aluminum chloride and electrodesiccation   Outcome: patient tolerated procedure well   Post-procedure details: sterile dressing applied and wound care instructions given   Dressing type: bandage and petrolatum    Destruction of lesion Complexity: extensive   Destruction method: electrodesiccation and curettage   Informed consent: discussed and consent obtained   Timeout:  patient name, date of birth, surgical site, and procedure verified Procedure prep:  Patient was prepped and draped in usual sterile fashion Prep type:  Isopropyl alcohol Anesthesia: the lesion was anesthetized in a standard fashion   Anesthetic:  1% lidocaine w/ epinephrine 1-100,000 buffered w/ 8.4% NaHCO3 Curettage performed in three different directions: Yes   Electrodesiccation performed over the curetted area: Yes   Lesion length (cm):  1.1 Lesion width (cm):  1.1 Margin per side (cm):  0.2 Final wound size (cm):  1.5 Hemostasis achieved with:  pressure, aluminum chloride and electrodesiccation Outcome: patient tolerated procedure well with no complications   Post-procedure details: sterile dressing applied and wound care instructions given   Dressing type: bandage and petrolatum    Specimen 2 - Surgical pathology Differential Diagnosis: D48.5 r/o SCC ED&C today  Check Margins: No 1.1 cm hyperkeratotic papule  R proximal pretibial  Epidermal / dermal shaving  Lesion diameter (cm):  1.1 Informed consent: discussed and consent obtained   Timeout: patient name, date of  birth, surgical site, and procedure verified   Procedure prep:  Patient was prepped and draped in usual sterile fashion Prep type:  Isopropyl alcohol Anesthesia: the lesion was anesthetized in a standard fashion   Anesthetic:  1% lidocaine w/ epinephrine 1-100,000 buffered w/ 8.4% NaHCO3 Instrument used: flexible razor blade   Hemostasis achieved with: pressure, aluminum chloride and electrodesiccation   Outcome: patient tolerated procedure well   Post-procedure details: sterile dressing applied and wound care instructions given   Dressing type: bandage and petrolatum    Destruction of lesion Complexity: extensive   Destruction method: electrodesiccation and curettage   Informed consent: discussed and consent obtained   Timeout:  patient name, date of birth, surgical site, and procedure verified Procedure prep:  Patient was prepped and draped in usual sterile fashion Prep type:  Isopropyl alcohol Anesthesia: the lesion was anesthetized in a standard fashion   Anesthetic:  1% lidocaine w/ epinephrine 1-100,000 buffered w/ 8.4% NaHCO3 Curettage performed in three different directions: Yes   Electrodesiccation performed over the curetted area: Yes   Lesion length (cm):  1.1 Lesion width (cm):  1.1 Margin per side (cm):  0.2 Final wound size (cm):  1.5 Hemostasis achieved with:  pressure, aluminum chloride and electrodesiccation Outcome: patient tolerated procedure well with no complications   Post-procedure details: sterile  dressing applied and wound care instructions given   Dressing type: bandage and petrolatum    Specimen 3 - Surgical pathology Differential Diagnosis: D48.5 r/o SCC ED&C today  Check Margins: No 1.1 cm hyperkeratotic papule  L distal med calf  Epidermal / dermal shaving  Lesion diameter (cm):  2.1 Informed consent: discussed and consent obtained   Timeout: patient name, date of birth, surgical site, and procedure verified   Procedure prep:  Patient was  prepped and draped in usual sterile fashion Prep type:  Isopropyl alcohol Anesthesia: the lesion was anesthetized in a standard fashion   Anesthetic:  1% lidocaine w/ epinephrine 1-100,000 buffered w/ 8.4% NaHCO3 Instrument used: flexible razor blade   Hemostasis achieved with: pressure, aluminum chloride and electrodesiccation   Outcome: patient tolerated procedure well   Post-procedure details: sterile dressing applied and wound care instructions given   Dressing type: bandage and petrolatum    Destruction of lesion Complexity: extensive   Destruction method: electrodesiccation and curettage   Informed consent: discussed and consent obtained   Timeout:  patient name, date of birth, surgical site, and procedure verified Procedure prep:  Patient was prepped and draped in usual sterile fashion Prep type:  Isopropyl alcohol Anesthesia: the lesion was anesthetized in a standard fashion   Anesthetic:  1% lidocaine w/ epinephrine 1-100,000 buffered w/ 8.4% NaHCO3 Curettage performed in three different directions: Yes   Electrodesiccation performed over the curetted area: Yes   Lesion length (cm):  2.1 Lesion width (cm):  2.1 Margin per side (cm):  0.2 Final wound size (cm):  2.5 Hemostasis achieved with:  pressure, aluminum chloride and electrodesiccation Outcome: patient tolerated procedure well with no complications   Post-procedure details: sterile dressing applied and wound care instructions given   Dressing type: bandage and petrolatum    Specimen 4 - Surgical pathology Differential Diagnosis: D48.5 r/o SCC ED&C today  Check Margins: No 2.1 cm hyperkeratotic papule  Start Mupirocin 2% ointment to aa's during healing process BID.  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  - Call if any new or changing lesions are noted between office visits  Actinic Damage - chronic, secondary to cumulative UV radiation exposure/sun exposure over time - diffuse scaly erythematous macules with underlying dyspigmentation - Recommend daily broad spectrum sunscreen SPF 30+ to sun-exposed areas, reapply every 2 hours as needed.  - Call for new or changing lesions.  Seborrheic Keratoses - Stuck-on, waxy, tan-brown papules and plaques  - Discussed benign etiology and prognosis. - Observe - Call for any changes  History of Basal Cell Carcinoma of the Skin - No evidence of recurrence today - Recommend regular full body skin exams - Recommend daily broad spectrum sunscreen SPF 30+ to sun-exposed areas, reapply every 2 hours as needed.  - Call if any new or changing lesions are noted between office visits  Return in about 6 weeks (around 08/08/2020).  Luther Redo, CMA, am acting as scribe for Sarina Ser, MD .  Documentation: I have reviewed the above documentation for accuracy and completeness, and I agree with the above.  Sarina Ser, MD

## 2020-07-02 ENCOUNTER — Telehealth: Payer: Self-pay

## 2020-07-02 NOTE — Telephone Encounter (Signed)
-----   Message from Ralene Bathe, MD sent at 07/02/2020 10:46 AM EST ----- Diagnosis 1. Skin , right mid to distal pretibial WELL DIFFERENTIATED SQUAMOUS CELL CARCINOMA 2. Skin , below the right med knee WELL DIFFERENTIATED SQUAMOUS CELL CARCINOMA 3. Skin , right proximal pretibial WELL DIFFERENTIATED SQUAMOUS CELL CARCINOMA 4. Skin , left distal med calf WELL DIFFERENTIATED SQUAMOUS CELL CARCINOMA WITH SUPERFICIAL INFILTRATION  1,2,3,4 -  all cancer - SCC All already treated Recheck next visit.

## 2020-07-02 NOTE — Telephone Encounter (Signed)
Patient informed of pathology results 

## 2020-07-09 ENCOUNTER — Ambulatory Visit (INDEPENDENT_AMBULATORY_CARE_PROVIDER_SITE_OTHER): Payer: PPO

## 2020-07-09 ENCOUNTER — Other Ambulatory Visit: Payer: Self-pay

## 2020-07-09 DIAGNOSIS — E538 Deficiency of other specified B group vitamins: Secondary | ICD-10-CM | POA: Diagnosis not present

## 2020-07-09 MED ORDER — CYANOCOBALAMIN 1000 MCG/ML IJ SOLN
1000.0000 ug | Freq: Once | INTRAMUSCULAR | Status: AC
  Administered 2020-07-09: 1000 ug via INTRAMUSCULAR

## 2020-07-11 ENCOUNTER — Other Ambulatory Visit: Payer: Self-pay | Admitting: Internal Medicine

## 2020-08-06 ENCOUNTER — Other Ambulatory Visit: Payer: Self-pay

## 2020-08-06 ENCOUNTER — Ambulatory Visit: Payer: PPO | Admitting: Dermatology

## 2020-08-06 DIAGNOSIS — L578 Other skin changes due to chronic exposure to nonionizing radiation: Secondary | ICD-10-CM

## 2020-08-06 DIAGNOSIS — Z85828 Personal history of other malignant neoplasm of skin: Secondary | ICD-10-CM

## 2020-08-06 DIAGNOSIS — D489 Neoplasm of uncertain behavior, unspecified: Secondary | ICD-10-CM

## 2020-08-06 DIAGNOSIS — C44629 Squamous cell carcinoma of skin of left upper limb, including shoulder: Secondary | ICD-10-CM | POA: Diagnosis not present

## 2020-08-06 DIAGNOSIS — L570 Actinic keratosis: Secondary | ICD-10-CM | POA: Diagnosis not present

## 2020-08-06 DIAGNOSIS — L820 Inflamed seborrheic keratosis: Secondary | ICD-10-CM

## 2020-08-06 NOTE — Patient Instructions (Addendum)
Cryotherapy Aftercare ? ?Wash gently with soap and water everyday.   ?Apply Vaseline and Band-Aid daily until healed.  ? ? ?Wound Care Instructions ? ?Cleanse wound gently with soap and water once a day then pat dry with clean gauze. Apply a thing coat of Petrolatum (petroleum jelly, "Vaseline") over the wound (unless you have an allergy to this). We recommend that you use a new, sterile tube of Vaseline. Do not pick or remove scabs. Do not remove the yellow or white "healing tissue" from the base of the wound. ? ?Cover the wound with fresh, clean, nonstick gauze and secure with paper tape. You may use Band-Aids in place of gauze and tape if the would is small enough, but would recommend trimming much of the tape off as there is often too much. Sometimes Band-Aids can irritate the skin. ? ?You should call the office for your biopsy report after 1 week if you have not already been contacted. ? ?If you experience any problems, such as abnormal amounts of bleeding, swelling, significant bruising, significant pain, or evidence of infection, please call the office immediately. ? ?FOR ADULT SURGERY PATIENTS: If you need something for pain relief you may take 1 extra strength Tylenol (acetaminophen) AND 2 Ibuprofen (200mg each) together every 4 hours as needed for pain. (do not take these if you are allergic to them or if you have a reason you should not take them.) Typically, you may only need pain medication for 1 to 3 days.  ? ? ?

## 2020-08-06 NOTE — Progress Notes (Signed)
Follow-Up Visit   Subjective  Joann Warren is a 74 y.o. female who presents for the following: 6 week follow up (Patient here today for follow up . Patient reports she has some areas on right pretibia and right shoulder she would like checked today. She states she noticed them 4 weeks ago. ).  The following portions of the chart were reviewed this encounter and updated as appropriate:  Tobacco  Allergies  Meds  Problems  Med Hx  Surg Hx  Fam Hx      Objective  Well appearing patient in no apparent distress; mood and affect are within normal limits.  A focused examination was performed including chest, arms, legs. Relevant physical exam findings are noted in the Assessment and Plan.  Objective  Left anterior deltoid: 0.8 cm hyperkeratotic papule   Objective  Chest, bilateral arms (17): Erythematous thin papules/macules with gritty scale.   Objective  chest, bilateral arms (23): Erythematous keratotic or waxy stuck-on papule or plaque.   Assessment & Plan  Neoplasm of uncertain behavior Left anterior deltoid  Epidermal / dermal shaving  Lesion diameter (cm):  0.8 Informed consent: discussed and consent obtained   Timeout: patient name, date of birth, surgical site, and procedure verified   Procedure prep:  Patient was prepped and draped in usual sterile fashion Prep type:  Isopropyl alcohol Anesthesia: the lesion was anesthetized in a standard fashion   Anesthetic:  1% lidocaine w/ epinephrine 1-100,000 buffered w/ 8.4% NaHCO3 Instrument used: flexible razor blade   Hemostasis achieved with: pressure, aluminum chloride and electrodesiccation   Outcome: patient tolerated procedure well   Post-procedure details: sterile dressing applied and wound care instructions given   Dressing type: bandage and petrolatum    Destruction of lesion Complexity: extensive   Destruction method: electrodesiccation and curettage   Informed consent: discussed and consent obtained    Timeout:  patient name, date of birth, surgical site, and procedure verified Procedure prep:  Patient was prepped and draped in usual sterile fashion Prep type:  Isopropyl alcohol Anesthesia: the lesion was anesthetized in a standard fashion   Anesthetic:  1% lidocaine w/ epinephrine 1-100,000 buffered w/ 8.4% NaHCO3 Curettage performed in three different directions: Yes   Electrodesiccation performed over the curetted area: Yes   Lesion length (cm):  0.8 Lesion width (cm):  0.8 Margin per side (cm):  0.2 Final wound size (cm):  1.2 Hemostasis achieved with:  pressure, aluminum chloride and electrodesiccation Outcome: patient tolerated procedure well with no complications   Post-procedure details: sterile dressing applied and wound care instructions given   Dressing type: bandage and petrolatum    Specimen 1 - Surgical pathology Differential Diagnosis: r/o scc  Check Margins: No 0.8 cm hyperkeratotic papule  Actinic keratosis (17) Chest, bilateral arms  Destruction of lesion - Chest, bilateral arms Complexity: simple   Destruction method: cryotherapy   Informed consent: discussed and consent obtained   Timeout:  patient name, date of birth, surgical site, and procedure verified Lesion destroyed using liquid nitrogen: Yes   Region frozen until ice ball extended beyond lesion: Yes   Outcome: patient tolerated procedure well with no complications   Post-procedure details: wound care instructions given    Inflamed seborrheic keratosis (23) chest, bilateral arms  Destruction of lesion - chest, bilateral arms Complexity: simple   Destruction method: cryotherapy   Informed consent: discussed and consent obtained   Timeout:  patient name, date of birth, surgical site, and procedure verified Lesion destroyed using liquid nitrogen:  Yes   Region frozen until ice ball extended beyond lesion: Yes   Outcome: patient tolerated procedure well with no complications   Post-procedure  details: wound care instructions given    Actinic Damage - chronic, secondary to cumulative UV radiation exposure/sun exposure over time - diffuse scaly erythematous macules with underlying dyspigmentation - Recommend daily broad spectrum sunscreen SPF 30+ to sun-exposed areas, reapply every 2 hours as needed.  - Call for new or changing lesions.  Return in about 3 months (around 11/04/2020) for ak followup.  IRuthell Rummage, CMA, am acting as scribe for Sarina Ser, MD.  Documentation: I have reviewed the above documentation for accuracy and completeness, and I agree with the above.  Sarina Ser, MD

## 2020-08-08 ENCOUNTER — Telehealth: Payer: Self-pay

## 2020-08-08 ENCOUNTER — Ambulatory Visit (INDEPENDENT_AMBULATORY_CARE_PROVIDER_SITE_OTHER): Payer: PPO

## 2020-08-08 ENCOUNTER — Other Ambulatory Visit: Payer: Self-pay

## 2020-08-08 ENCOUNTER — Encounter: Payer: Self-pay | Admitting: Dermatology

## 2020-08-08 DIAGNOSIS — E538 Deficiency of other specified B group vitamins: Secondary | ICD-10-CM | POA: Diagnosis not present

## 2020-08-08 MED ORDER — CYANOCOBALAMIN 1000 MCG/ML IJ SOLN
1000.0000 ug | Freq: Once | INTRAMUSCULAR | Status: AC
Start: 2020-08-08 — End: 2020-08-08
  Administered 2020-08-08: 1000 ug via INTRAMUSCULAR

## 2020-08-08 NOTE — Telephone Encounter (Signed)
Advised patient of results/hd  

## 2020-08-08 NOTE — Telephone Encounter (Signed)
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

## 2020-08-10 ENCOUNTER — Other Ambulatory Visit: Payer: Self-pay | Admitting: Internal Medicine

## 2020-08-10 DIAGNOSIS — M810 Age-related osteoporosis without current pathological fracture: Secondary | ICD-10-CM

## 2020-08-23 ENCOUNTER — Other Ambulatory Visit: Payer: Self-pay | Admitting: Dermatology

## 2020-08-28 ENCOUNTER — Ambulatory Visit: Payer: PPO | Admitting: Hospice and Palliative Medicine

## 2020-08-29 ENCOUNTER — Other Ambulatory Visit: Payer: Self-pay

## 2020-08-29 ENCOUNTER — Ambulatory Visit (INDEPENDENT_AMBULATORY_CARE_PROVIDER_SITE_OTHER): Payer: PPO | Admitting: Hospice and Palliative Medicine

## 2020-08-29 ENCOUNTER — Encounter: Payer: Self-pay | Admitting: Hospice and Palliative Medicine

## 2020-08-29 VITALS — BP 134/86 | HR 72 | Temp 97.5°F | Resp 16 | Ht 62.0 in | Wt 111.0 lb

## 2020-08-29 DIAGNOSIS — E785 Hyperlipidemia, unspecified: Secondary | ICD-10-CM

## 2020-08-29 DIAGNOSIS — H6123 Impacted cerumen, bilateral: Secondary | ICD-10-CM | POA: Diagnosis not present

## 2020-08-29 DIAGNOSIS — I10 Essential (primary) hypertension: Secondary | ICD-10-CM | POA: Diagnosis not present

## 2020-08-29 DIAGNOSIS — N1832 Chronic kidney disease, stage 3b: Secondary | ICD-10-CM

## 2020-08-29 MED ORDER — AMLODIPINE BESYLATE 5 MG PO TABS: 5.0000 mg | ORAL_TABLET | Freq: Every day | ORAL | 1 refills | Status: DC

## 2020-08-29 MED ORDER — SIMVASTATIN 20 MG PO TABS: 20.0000 mg | ORAL_TABLET | Freq: Every day | ORAL | 1 refills | Status: DC

## 2020-08-29 NOTE — Progress Notes (Signed)
[facility] [place], [place] [address]  Internal MEDICINE  Office Visit Note  Patient Name: Joann Warren  [id number]  [id number]  Date of Service: 08/29/2020  Chief Complaint  Patient presents with  . Acute Visit    Ear wax removal in both ears but more in left, sometimes pt hears a noise like a ringing, doesn't use q-tips  . Hyperlipidemia  . Hypertension  . Anemia  . Quality Metric Gaps    PNA    HPI Patient is here for a sick visit, her husband looked inside of her ears and felt as though they were clogged with wax She denies any symptoms, itching or irritation, no recent changes in her hearing  Reviewed her recent labs--elevated creatinine with decline in GFR, renal US--simple cyst to lower right kidney remains stable, renal vascular US not on file  Requesting a few refills today  Current Medication: Outpatient Encounter Medications as of 08/29/2020  Medication Sig  . alendronate (FOSAMAX) 70 MG tablet TAKE 1 TABLET EVERY 7 DAYS WITH A FULL GLASS OF WATER ON AN EMPTY STOMACH DO NOT LIE DOWN FOR AT LEAST 30 MIN  . bimatoprost (LUMIGAN) 0.03 % ophthalmic solution   . bisoprolol-hydrochlorothiazide (ZIAC) 2.5-6.25 MG tablet TAKE ONE TABLET EVERY MORNING  . hydrocortisone 2.5 % cream APPLY TO AFFECTED AREAS ON THE SKIN EVERYDAY FOR UP TO 5 DAYS PER WEEK  . hydrOXYzine (ATARAX/VISTARIL) 10 MG tablet TAKE 1 TABLET BY MOUTH ONCE TO TWICE DAILY AT NIGHT AS NEEDED FOR ITCHING  . mupirocin ointment (BACTROBAN) 2 % Apply topically daily.  Marland Kitchen triamcinolone cream (KENALOG) 0.1 % Apply 1 application topically daily as needed.  . [DISCONTINUED] amLODipine (NORVASC) 5 MG tablet TAKE 1 TABLET BY MOUTH DAILY  . [DISCONTINUED] simvastatin (ZOCOR) 20 MG tablet TAKE 1 TABLET AT BEDTIME  . [DISCONTINUED] simvastatin (ZOCOR) 20 MG tablet TAKE ONE TABLET AT BEDTIME  . amLODipine (NORVASC) 5 MG tablet Take 1 tablet (5 mg total) by mouth daily.  . simvastatin (ZOCOR)  20 MG tablet Take 1 tablet (20 mg total) by mouth at bedtime.   No facility-administered encounter medications on file as of 08/29/2020.    Surgical History: Past Surgical History:  Procedure Laterality Date  . BREAST EXCISIONAL BIOPSY Left 1972   neg  . BREAST EXCISIONAL BIOPSY Right 1980   neg  . COLONOSCOPY    . ESOPHAGOGASTRODUODENOSCOPY (EGD) WITH PROPOFOL N/A 04/09/2015   Procedure: ESOPHAGOGASTRODUODENOSCOPY (EGD) WITH PROPOFOL;  Surgeon: Manya Silvas, MD;  Location: [facility] ENDOSCOPY;  Service: Endoscopy;  Laterality: N/A;  . skin cancer removal      Medical History: Past Medical History:  Diagnosis Date  . Actinic keratosis   . Anemia   . Basal cell carcinoma 12/23/2007   Upper back.  . Basal cell carcinoma 05/17/2009   Right med lower leg  . Basal cell carcinoma 07/06/2014   Left medial breast  . Basal cell carcinoma 03/01/2015   Right medial mid pretibial  . Basal cell carcinoma 07/16/2015   Right superior breast  . Basal cell carcinoma 05/21/2016   Left inferior medial breast  . Basal cell carcinoma 02/08/2018   Left medial breast  . Basal cell carcinoma 11/01/2018   Left medial breast  . Basal cell carcinoma 03/30/2019   Left med inf breast  . Basal cell carcinoma 09/05/2019   Left proximal bicep  . Basal cell carcinoma 12/08/2019   Right forehead. Nodular pattern.  . Basal cell carcinoma 12/13/2019  Left med. breast inf. Nodular and infiltrative patterns. EDC  . Basal cell carcinoma 12/13/2019   Left med. breast. sup. Nodular pattern. EDC  . Basal cell carcinoma 05/14/2020   Right upper back. BCC with focal sclerosis. EDC.  . Cancer ([place])    squamous cell  . Glaucoma   . Hyperlipidemia   . Hypertension   . Osteoporosis   . Squamous cell carcinoma of skin 12/08/2007   Left lower leg. WD SCC  . Squamous cell carcinoma of skin 02/01/2008   Left distal med. pretibial. WD SCC  . Squamous cell carcinoma of skin 02/01/2008   Right mid pretibial. WD  SCC  . Squamous cell carcinoma of skin 02/01/2008   Right mid pretibal inferior. WD SCC with superficial infiltration.  . Squamous cell carcinoma of skin 04/05/2008   Left dorsum hand. WD SCC with superficial infiltration.  . Squamous cell carcinoma of skin 06/15/2008   Right lower leg inferior. WD SCC  . Squamous cell carcinoma of skin 06/15/2008   Right lower leg superior. SCCis hypertrophic  . Squamous cell carcinoma of skin 07/05/2008   Right lat. lower leg, Right lower leg, Left lower leg  . Squamous cell carcinoma of skin 08/04/2008   Right ant. lower leg, Left med. lower leg  . Squamous cell carcinoma of skin 09/01/2008   Right post. lower leg  . Squamous cell carcinoma of skin 10/04/2008   Left post leg  . Squamous cell carcinoma of skin 12/01/2008   Left lower leg  . Squamous cell carcinoma of skin 03/08/2009   Right ant. mid lower leg, Right lower leg  . Squamous cell carcinoma of skin 05/17/2009   Left lower post. leg superior  . Squamous cell carcinoma of skin 10/11/2009   Left lower leg. KA-pattern  . Squamous cell carcinoma of skin 03/28/2010   Right lower leg  . Squamous cell carcinoma of skin 03/25/2013   Right lower leg  . Squamous cell carcinoma of skin 06/23/2013   Right post. lower leg. Right medial lower leg. Left medial lower leg  . Squamous cell carcinoma of skin 09/09/2013   Left anterior upper arm. Right anterior lower leg. Right lat. lower leg. Right below knee lower leg  . Squamous cell carcinoma of skin 12/08/2013   Right posterior lower leg sup. Right forearm  . Squamous cell carcinoma of skin 03/03/2014   Right posterior leg. Right med. lower leg  . Squamous cell carcinoma of skin 06/22/2014   Right to of shoulder anterior. Right top of shoulder posterior  . Squamous cell carcinoma of skin 08/03/2014   Right anterior forearm  . Squamous cell carcinoma of skin 09/14/2014   Right chest  . Squamous cell carcinoma of skin 10/30/2014   Right calf   . Squamous cell carcinoma of skin 12/14/2014   Right med. distal pretibial sup. Right med distal pretibial inf. Left proximal bicep sup. Left proximal bicep inf.  . Squamous cell carcinoma of skin 03/01/2015   Left lat proximal pretibial near knee  . Squamous cell carcinoma of skin 03/29/2015   Left mid to prox. pretibial. Left mid. lat. pretibial.   . Squamous cell carcinoma of skin 04/19/2015   Right mid calf. Right distal lat. pretibial  . Squamous cell carcinoma of skin 05/31/2015   Right proximal med. pretibial ant. Right proximal med. pretibial posterior  . Squamous cell carcinoma of skin 07/16/2015   Right upper arm inf. deltoid  . Squamous cell carcinoma of skin 08/06/2015   Left  distal pretibial  . Squamous cell carcinoma of skin 09/12/2015   Left postauricular area  . Squamous cell carcinoma of skin 12/12/2015   Left proximal pretibial x4 sites  . Squamous cell carcinoma of skin 12/31/2015   Right med. lower leg above ankle sup. Right med. lower leg above ankle inf. Left inf. lat. calf med. Left inf. calf lat.  . Squamous cell carcinoma of skin 01/31/2016   Right med. sup. ankle area. Right med mid proximal calf. Left dorsum foot.   . Squamous cell carcinoma of skin 03/17/2016   Left post. distal calf lateral. Left post distal calf med. Left mid lat ant. thigh. Right mid lat. calf  . Squamous cell carcinoma of skin 05/21/2016   Right proximal medial pretibial. Right mid med. pretibial.  . Squamous cell carcinoma of skin 07/03/2016   Right mid med. pretibial. Right proximal med. pretibial  . Squamous cell carcinoma of skin 08/20/2016   Right medial calf. Left distal pretibial. Right upper back paraspinal  . Squamous cell carcinoma of skin 10/30/2016   Right medial calf x2  . Squamous cell carcinoma of skin 12/10/2016   left mid back 6.0cm lat to spine. Left sup. lat. calf. Left inf. post. calf  . Squamous cell carcinoma of skin 12/25/2016   Right medial above ankle.  Right mid pretibial. Right prox. pretibial. Right prox. pretibial med.   . Squamous cell carcinoma of skin 01/19/2017   Left forearm. Left ant. neck  . Squamous cell carcinoma of skin 03/23/2017   Left med. pretibial below knee  . Squamous cell carcinoma of skin 04/13/2017   Right lat. sup. ankle. Right medial ankle  . Squamous cell carcinoma of skin 04/27/2017   Left med. distal prox. calf. Left medial distal calf. Left lat. distal prox. calf. Left lat distal distal calf  . Squamous cell carcinoma of skin 05/14/2017   Left bicep. Left dorsum mid forearm  . Squamous cell carcinoma of skin 06/25/2017   Right med. distal calf x3  . Squamous cell carcinoma of skin 09/02/2017   Left med. infraclavicular  . Squamous cell carcinoma of skin 12/21/2017   Left prox. med. lower leg. Right forearm. Left forearm. Left bicep  . Squamous cell carcinoma of skin 01/07/2018   Left dorsum foot prox. Left dorsum foot distal. Left mid to distal calf. Right chest  . Squamous cell carcinoma of skin 03/08/2018   Left bicep. Right proximal dorsum forearm. Right distal med. pretibial sup. Right distal med pretibial inf.   . Squamous cell carcinoma of skin 04/05/2018   Right sup. med scapula  . Squamous cell carcinoma of skin 06/03/2018   Left med. ankle sup. Left med. ankle inf. Left lateral ankle.  Marland Kitchen Squamous cell carcinoma of skin 09/01/2018   Right mid lat. calf. Right lat. knee. Prox post calf  . Squamous cell carcinoma of skin 11/01/2018   Left lateral ankle  . Squamous cell carcinoma of skin 12/08/2018   Right lateral ankle lat malleolus  . Squamous cell carcinoma of skin 01/05/2019   Right lateral elbow. Left mid medial calf. Left prox. ant. thigh. Right distal lat tricep  . Squamous cell carcinoma of skin 01/19/2019   Right popliteal  . Squamous cell carcinoma of skin 02/03/2019   Left mid lat calf. Left mid calf. Left dorsum lat distal foot. Left dorsum distal foot  . Squamous cell carcinoma of  skin 03/30/2019   Right lower leg above med ankle ant. Right lower leg above ankle sup.  Right lower leg above the med ankle inf.  . Squamous cell carcinoma of skin 05/12/2019   Right chest medial. Left deltoid  . Squamous cell carcinoma of skin 06/23/2019   Right ant thigh distal above knee. Left med distal thigh. Left ant thigh above knee  . Squamous cell carcinoma of skin 07/20/2019   Right chest. Right dorsal hand.  . Squamous cell carcinoma of skin 08/03/2019   Left distal lat pretibial. Left distal med calf. Right prox. lat calf  . Squamous cell carcinoma of skin 09/05/2019   Right middle bicep. Right sup lat bicep. Right sup mid bicep. Right sup med bicep  . Squamous cell carcinoma of skin 09/15/2019   Right proximal med pretibial. Left distal lat thigh  . Squamous cell carcinoma of skin 04/02/2020   Right calf, bleow right medial knee sup, below right medial knee post, below right medial knee inf  . Squamous cell carcinoma of skin 05/14/2020   Right ant. shoulder. Mod. Diff. SCC. EDC  . Squamous cell carcinoma of skin 06/27/2020   R mid to distal pretibial - ED&C  . Squamous cell carcinoma of skin 06/27/2020   Below the R knee - ED&C   . Squamous cell carcinoma of skin 06/27/2020   R prox pretibial   . Squamous cell carcinoma of skin 06/27/2020   L distal med calf  . Squamous cell carcinoma of skin 08/06/2020   Left ant deltoid    Family History: Family History  Problem Relation Age of Onset  . Breast cancer Sister 48  . Atrial fibrillation Mother     Social History   Socioeconomic History  . Marital status: Married    Spouse name: Not on file  . Number of children: Not on file  . Years of education: Not on file  . Highest education level: Not on file  Occupational History  . Not on file  Tobacco Use  . Smoking status: Never Smoker  . Smokeless tobacco: Never Used  Vaping Use  . Vaping Use: Never used  Substance and Sexual Activity  . Alcohol use: No  .  Drug use: No  . Sexual activity: Not on file  Other Topics Concern  . Not on file  Social History Narrative  . Not on file   Social Determinants of Health   Financial Resource Strain: Not on file  Food Insecurity: Not on file  Transportation Needs: Not on file  Physical Activity: Not on file  Stress: Not on file  Social Connections: Not on file  Intimate Partner Violence: Not on file      Review of Systems  Constitutional: Negative for chills, diaphoresis and fatigue.  HENT: Negative for ear pain, postnasal drip and sinus pressure.   Eyes: Negative for photophobia, discharge, redness, itching and visual disturbance.  Respiratory: Negative for cough, shortness of breath and wheezing.   Cardiovascular: Negative for chest pain, palpitations and leg swelling.  Gastrointestinal: Negative for abdominal pain, constipation, diarrhea, nausea and vomiting.  Genitourinary: Negative for dysuria and flank pain.  Musculoskeletal: Negative for arthralgias, back pain, gait problem and neck pain.  Skin: Negative for color change.  Allergic/Immunologic: Negative for environmental allergies and food allergies.  Neurological: Negative for dizziness and headaches.  Hematological: Does not bruise/bleed easily.  Psychiatric/Behavioral: Negative for agitation, behavioral problems (depression) and hallucinations.    Vital Signs: BP 134/86   Pulse 72   Temp (!) 97.5 F (36.4 C)   Resp 16   Ht 5\' 2"  (1.575 m)  Wt 111 lb (50.3 kg)   SpO2 98%   BMI 20.30 kg/m    Physical Exam Vitals reviewed.  Constitutional:      Appearance: Normal appearance. She is normal weight.  HENT:     Ears:     Comments: Mild cerumen buildup to bilateral ears--no full impaction Cardiovascular:     Rate and Rhythm: Normal rate and regular rhythm.     Pulses: Normal pulses.     Heart sounds: Normal heart sounds.  Pulmonary:     Effort: Pulmonary effort is normal.     Breath sounds: Normal breath sounds.   Musculoskeletal:        General: Normal range of motion.     Cervical back: Normal range of motion.  Skin:    General: Skin is warm.  Neurological:     General: No focal deficit present.     Mental Status: She is alert and oriented to person, place, and time. Mental status is at baseline.  Psychiatric:        Mood and Affect: Mood normal.        Behavior: Behavior normal.        Thought Content: Thought content normal.        Judgment: Judgment normal.   Assessment/Plan: 1. Chronic kidney disease (CKD) stage G3b/A1, moderately decreased glomerular filtration rate (GFR) between 30-44 mL/min/1.73 square meter and albuminuria creatinine ratio less than 30 mg/g (HCC) GFR 44 in August, gradual decline since 2019, will review arterial US May need to consider d/c HCTZ, denies frequent use of NASIDs - VAS US RENAL ARTERY DUPLEX; Future  2. Hypertension, unspecified type BP and HR today well controlled, continue to monitor - amLODipine (NORVASC) 5 MG tablet; Take 1 tablet (5 mg total) by mouth daily.  Dispense: 90 tablet; Refill: 1 - Comprehensive Metabolic Panel (CMET)  3. Hyperlipidemia, unspecified hyperlipidemia type Continue statin therapy, lipid panel abnormal on last check, continue to monitor - simvastatin (ZOCOR) 20 MG tablet; Take 1 tablet (20 mg total) by mouth at bedtime.  Dispense: 90 tablet; Refill: 1  4. Excessive cerumen in both ear canals Easily removed with curet, no irrigation needed  General Counseling: Haydn verbalizes understanding of the findings of todays visit and agrees with plan of treatment. I have discussed any further diagnostic evaluation that may be needed or ordered today. We also reviewed her medications today. she has been encouraged to call the office with any questions or concerns that should arise related to todays visit.    Orders Placed This Encounter  Procedures  . Comprehensive Metabolic Panel (CMET)  . VAS US RENAL ARTERY DUPLEX    Meds  ordered this encounter  Medications  . amLODipine (NORVASC) 5 MG tablet    Sig: Take 1 tablet (5 mg total) by mouth daily.    Dispense:  90 tablet    Refill:  1    FOR FUTURE REFILLS  . simvastatin (ZOCOR) 20 MG tablet    Sig: Take 1 tablet (20 mg total) by mouth at bedtime.    Dispense:  90 tablet    Refill:  1    Time spent: 30 Minutes Time spent includes review of chart, medications, test results and follow-up plan with the patient.  This patient was seen by Theodoro Grist AGNP-C in Collaboration with Dr Lavera Guise as a part of collaborative care agreement     Tanna Furry. Jakaleb Payer AGNP-C Internal medicine

## 2020-09-07 ENCOUNTER — Other Ambulatory Visit: Payer: PPO

## 2020-09-07 ENCOUNTER — Ambulatory Visit: Payer: PPO

## 2020-09-10 DIAGNOSIS — H401112 Primary open-angle glaucoma, right eye, moderate stage: Secondary | ICD-10-CM | POA: Diagnosis not present

## 2020-09-11 DIAGNOSIS — H401112 Primary open-angle glaucoma, right eye, moderate stage: Secondary | ICD-10-CM | POA: Diagnosis not present

## 2020-09-12 ENCOUNTER — Encounter: Payer: Self-pay | Admitting: Radiation Oncology

## 2020-09-12 ENCOUNTER — Other Ambulatory Visit: Payer: Self-pay | Admitting: Licensed Clinical Social Worker

## 2020-09-12 ENCOUNTER — Ambulatory Visit
Admission: RE | Admit: 2020-09-12 | Discharge: 2020-09-12 | Disposition: A | Discharge status: Home / Self Care | Payer: PPO | Source: Ambulatory Visit | Attending: Radiation Oncology | Admitting: Radiation Oncology

## 2020-09-12 VITALS — BP 148/80 | HR 60 | Temp 96.8°F | Wt 110.0 lb

## 2020-09-12 DIAGNOSIS — C44721 Squamous cell carcinoma of skin of unspecified lower limb, including hip: Secondary | ICD-10-CM | POA: Diagnosis not present

## 2020-09-12 DIAGNOSIS — Z923 Personal history of irradiation: Secondary | ICD-10-CM | POA: Insufficient documentation

## 2020-09-12 MED ORDER — TRIAMCINOLONE ACETONIDE 0.1 % EX CREA: 1.0000 "application " | TOPICAL_CREAM | Freq: Every day | CUTANEOUS | 11 refills | Status: DC | PRN

## 2020-09-12 NOTE — Progress Notes (Signed)
Radiation Oncology Follow up Note  Name: Joann Warren   Date:   09/12/2020 MRN:  [id number] DOB: 05-Feb-1946    This 75 y.o. female presents to the clinic today for 2-year follow-up status post bilateral lower extremity electron-beam therapy for skin cancer.  REFERRING PROVIDER: Lavera Guise, MD  HPI: Patient is a 75 year old female now out 2 years having completed electron-beam therapy to her bilateral lower extremities for widespread areas of squamous cell carcinoma.  Seen today in routine follow-up she is doing well no areas of active skin cancer noted.  She is having no swelling or pain or discomfort in her lower extremities.  She is under close observation and treatment by dermatology. COMPLICATIONS OF TREATMENT: none  FOLLOW UP COMPLIANCE: keeps appointments   PHYSICAL EXAM:  BP (!) 148/80   Pulse 60   Temp (!) 96.8 F (36 C) (Tympanic)   Wt 110 lb (49.9 kg)   BMI 20.12 kg/m  Lower extremities show changes consistent with prior radiation no evidence of active skin cancer is noted no ulcerative lesions no lymphedema is noted.  Well-developed well-nourished patient in NAD. HEENT reveals PERLA, EOMI, discs not visualized.  Oral cavity is clear. No oral mucosal lesions are identified. Neck is clear without evidence of cervical or supraclavicular adenopathy. Lungs are clear to A&P. Cardiac examination is essentially unremarkable with regular rate and rhythm without murmur rub or thrill. Abdomen is benign with no organomegaly or masses noted. Motor sensory and DTR levels are equal and symmetric in the upper and lower extremities. Cranial nerves II through XII are grossly intact. Proprioception is intact. No peripheral adenopathy or edema is identified. No motor or sensory levels are noted. Crude visual fields are within normal range.  RADIOLOGY RESULTS: No current films for review  PLAN: Present time patient is treated excellent response to bilateral lower extremity electron-beam  therapy for her widespread squamous cell carcinomas.  I am going to turn follow-up care over to dermatology.  We will be happy to reevaluate and consult the patient again should she need my input.  Patient comprehends my recommendations well.  I would like to take this opportunity to thank you for allowing me to participate in the care of your patient.Noreene Filbert, MD

## 2020-09-14 ENCOUNTER — Other Ambulatory Visit: Payer: Self-pay | Admitting: Radiation Oncology

## 2020-09-14 DIAGNOSIS — I10 Essential (primary) hypertension: Secondary | ICD-10-CM | POA: Diagnosis not present

## 2020-09-14 DIAGNOSIS — C44721 Squamous cell carcinoma of skin of unspecified lower limb, including hip: Secondary | ICD-10-CM

## 2020-09-15 LAB — COMPREHENSIVE METABOLIC PANEL
ALT: 6 IU/L (ref 0–32)
AST: 15 IU/L (ref 0–40)
Albumin/Globulin Ratio: 1.8 (ref 1.2–2.2)
Albumin: 4.5 g/dL (ref 3.7–4.7)
Alkaline Phosphatase: 65 IU/L (ref 44–121)
BUN/Creatinine Ratio: 15 (ref 12–28)
BUN: 17 mg/dL (ref 8–27)
Bilirubin Total: 0.5 mg/dL (ref 0.0–1.2)
CO2: 25 mmol/L (ref 20–29)
Calcium: 9.4 mg/dL (ref 8.7–10.3)
Chloride: 100 mmol/L (ref 96–106)
Creatinine, Ser: 1.14 mg/dL — ABNORMAL HIGH (ref 0.57–1.00)
GFR calc Af Amer: 55 mL/min/{1.73_m2} — ABNORMAL LOW (ref >59)
GFR calc non Af Amer: 47 mL/min/{1.73_m2} — ABNORMAL LOW (ref >59)
Globulin, Total: 2.5 g/dL (ref 1.5–4.5)
Glucose: 102 mg/dL — ABNORMAL HIGH (ref 65–99)
Potassium: 4.4 mmol/L (ref 3.5–5.2)
Sodium: 143 mmol/L (ref 134–144)
Total Protein: 7 g/dL (ref 6.0–8.5)

## 2020-09-17 ENCOUNTER — Other Ambulatory Visit: Payer: Self-pay | Admitting: Radiation Oncology

## 2020-09-17 DIAGNOSIS — C44721 Squamous cell carcinoma of skin of unspecified lower limb, including hip: Secondary | ICD-10-CM

## 2020-09-18 ENCOUNTER — Other Ambulatory Visit: Payer: Self-pay | Admitting: *Deleted

## 2020-09-19 ENCOUNTER — Other Ambulatory Visit: Payer: PPO

## 2020-09-19 ENCOUNTER — Ambulatory Visit (INDEPENDENT_AMBULATORY_CARE_PROVIDER_SITE_OTHER): Payer: PPO

## 2020-09-19 DIAGNOSIS — E538 Deficiency of other specified B group vitamins: Secondary | ICD-10-CM

## 2020-09-19 MED ORDER — CYANOCOBALAMIN 1000 MCG/ML IJ SOLN
1000.0000 ug | Freq: Once | INTRAMUSCULAR | Status: AC
  Administered 2020-09-19: 1000 ug via INTRAMUSCULAR

## 2020-10-12 ENCOUNTER — Ambulatory Visit (INDEPENDENT_AMBULATORY_CARE_PROVIDER_SITE_OTHER): Payer: PPO

## 2020-10-12 ENCOUNTER — Other Ambulatory Visit: Payer: Self-pay

## 2020-10-12 DIAGNOSIS — E538 Deficiency of other specified B group vitamins: Secondary | ICD-10-CM | POA: Diagnosis not present

## 2020-10-12 MED ORDER — CYANOCOBALAMIN 1000 MCG/ML IJ SOLN
1000.0000 ug | Freq: Once | INTRAMUSCULAR | Status: AC
Start: 2020-10-12 — End: 2020-10-12
  Administered 2020-10-12: 1000 ug via INTRAMUSCULAR

## 2020-10-27 ENCOUNTER — Other Ambulatory Visit: Payer: Self-pay | Admitting: Dermatology

## 2020-10-30 ENCOUNTER — Ambulatory Visit: Payer: Self-pay | Admitting: Hospice and Palliative Medicine

## 2020-10-31 ENCOUNTER — Ambulatory Visit: Payer: PPO | Admitting: Dermatology

## 2020-11-09 ENCOUNTER — Ambulatory Visit (INDEPENDENT_AMBULATORY_CARE_PROVIDER_SITE_OTHER): Payer: PPO

## 2020-11-09 ENCOUNTER — Other Ambulatory Visit: Payer: Self-pay

## 2020-11-09 DIAGNOSIS — E538 Deficiency of other specified B group vitamins: Secondary | ICD-10-CM

## 2020-11-09 MED ORDER — CYANOCOBALAMIN 1000 MCG/ML IJ SOLN
1000.0000 ug | Freq: Once | INTRAMUSCULAR | Status: AC
Start: 2020-11-09 — End: 2020-11-09
  Administered 2020-11-09: 1000 ug via INTRAMUSCULAR

## 2020-11-13 ENCOUNTER — Ambulatory Visit: Payer: PPO | Admitting: Hospice and Palliative Medicine

## 2020-11-16 ENCOUNTER — Ambulatory Visit: Payer: PPO | Admitting: Hospice and Palliative Medicine

## 2020-11-26 ENCOUNTER — Other Ambulatory Visit: Payer: Self-pay | Admitting: Dermatology

## 2020-11-28 ENCOUNTER — Ambulatory Visit: Payer: PPO

## 2020-11-28 DIAGNOSIS — N1832 Chronic kidney disease, stage 3b: Secondary | ICD-10-CM

## 2020-11-29 NOTE — Progress Notes (Signed)
Normal [place], will discuss at upcoming visit.

## 2020-12-07 ENCOUNTER — Other Ambulatory Visit: Payer: Self-pay

## 2020-12-07 ENCOUNTER — Ambulatory Visit (INDEPENDENT_AMBULATORY_CARE_PROVIDER_SITE_OTHER): Payer: PPO | Admitting: Hospice and Palliative Medicine

## 2020-12-07 ENCOUNTER — Encounter: Payer: Self-pay | Admitting: Hospice and Palliative Medicine

## 2020-12-07 VITALS — BP 132/80 | HR 70 | Temp 98.6°F | Resp 16 | Ht 62.0 in | Wt 110.0 lb

## 2020-12-07 DIAGNOSIS — N1831 Chronic kidney disease, stage 3a: Secondary | ICD-10-CM | POA: Diagnosis not present

## 2020-12-07 DIAGNOSIS — I10 Essential (primary) hypertension: Secondary | ICD-10-CM

## 2020-12-07 DIAGNOSIS — E538 Deficiency of other specified B group vitamins: Secondary | ICD-10-CM

## 2020-12-07 DIAGNOSIS — M810 Age-related osteoporosis without current pathological fracture: Secondary | ICD-10-CM

## 2020-12-07 MED ORDER — CYANOCOBALAMIN 1000 MCG/ML IJ SOLN
1000.0000 ug | Freq: Once | INTRAMUSCULAR | Status: AC
  Administered 2020-12-07: 1000 ug via INTRAMUSCULAR

## 2020-12-07 NOTE — Progress Notes (Signed)
[facility] [place], [place] [address]  Internal MEDICINE  Office Visit Note  Patient Name: Joann Warren  [id number]  [id number]  Date of Service: 12/16/2020  Chief Complaint  Patient presents with  . Anemia  . Cancer  . Hyperlipidemia  . Follow-up    Review [place]     HPI Patient is here for routine follow-up Reviewed renal vascular US--normal exam, no evidence of stenosis Right renal cyst Will continue to closely monitor kidney function BP remains well controlled Encouraged to avoid use of NSAID's and may use acetaminophen for general aches and pains as needed  Feeling well today, leaving for beach vacation this weekend to spend with her family, no acute issues or complaints today  Current Medication: Outpatient Encounter Medications as of 12/07/2020  Medication Sig  . alendronate (FOSAMAX) 70 MG tablet TAKE 1 TABLET EVERY 7 DAYS WITH A FULL GLASS OF WATER ON AN EMPTY STOMACH DO NOT LIE DOWN FOR AT LEAST 30 MIN  . amLODipine (NORVASC) 5 MG tablet Take 1 tablet (5 mg total) by mouth daily.  . bimatoprost (LUMIGAN) 0.03 % ophthalmic solution   . bisoprolol-hydrochlorothiazide (ZIAC) 2.5-6.25 MG tablet TAKE ONE TABLET EVERY MORNING  . hydrocortisone 2.5 % cream APPLY TO AFFECTED AREAS ON THE SKIN EVERYDAY FOR UP TO 5 DAYS PER WEEK  . hydrOXYzine (ATARAX/VISTARIL) 10 MG tablet TAKE 1 TABLET BY MOUTH ONCE TO TWICE DAILY AT NIGHT AS NEEDED FOR ITCHING  . mupirocin ointment (BACTROBAN) 2 % Apply topically daily.  . simvastatin (ZOCOR) 20 MG tablet Take 1 tablet (20 mg total) by mouth at bedtime.  . triamcinolone (KENALOG) 0.1 % APPLY ONCE DAILY AS NEEDED  . [EXPIRED] cyanocobalamin ((VITAMIN B-12)) injection 1,000 mcg    No facility-administered encounter medications on file as of 12/07/2020.    Surgical History: Past Surgical History:  Procedure Laterality Date  . BREAST EXCISIONAL BIOPSY Left 1972   neg  . BREAST EXCISIONAL BIOPSY Right 1980    neg  . COLONOSCOPY    . ESOPHAGOGASTRODUODENOSCOPY (EGD) WITH PROPOFOL N/A 04/09/2015   Procedure: ESOPHAGOGASTRODUODENOSCOPY (EGD) WITH PROPOFOL;  Surgeon: Manya Silvas, MD;  Location: [facility] ENDOSCOPY;  Service: Endoscopy;  Laterality: N/A;  . skin cancer removal      Medical History: Past Medical History:  Diagnosis Date  . Actinic keratosis   . Anemia   . Basal cell carcinoma 12/23/2007   Upper back.  . Basal cell carcinoma 05/17/2009   Right med lower leg  . Basal cell carcinoma 07/06/2014   Left medial breast  . Basal cell carcinoma 03/01/2015   Right medial mid pretibial  . Basal cell carcinoma 07/16/2015   Right superior breast  . Basal cell carcinoma 05/21/2016   Left inferior medial breast  . Basal cell carcinoma 02/08/2018   Left medial breast  . Basal cell carcinoma 11/01/2018   Left medial breast  . Basal cell carcinoma 03/30/2019   Left med inf breast  . Basal cell carcinoma 09/05/2019   Left proximal bicep  . Basal cell carcinoma 12/08/2019   Right forehead. Nodular pattern.  . Basal cell carcinoma 12/13/2019   Left med. breast inf. Nodular and infiltrative patterns. EDC  . Basal cell carcinoma 12/13/2019   Left med. breast. sup. Nodular pattern. EDC  . Basal cell carcinoma 05/14/2020   Right upper back. BCC with focal sclerosis. EDC.  . Cancer ([place])    squamous cell  . Glaucoma   . Hyperlipidemia   . Hypertension   .  Osteoporosis   . Squamous cell carcinoma of skin 12/08/2007   Left lower leg. WD SCC  . Squamous cell carcinoma of skin 02/01/2008   Left distal med. pretibial. WD SCC  . Squamous cell carcinoma of skin 02/01/2008   Right mid pretibial. WD SCC  . Squamous cell carcinoma of skin 02/01/2008   Right mid pretibal inferior. WD SCC with superficial infiltration.  . Squamous cell carcinoma of skin 04/05/2008   Left dorsum hand. WD SCC with superficial infiltration.  . Squamous cell carcinoma of skin 06/15/2008   Right lower leg inferior.  WD SCC  . Squamous cell carcinoma of skin 06/15/2008   Right lower leg superior. SCCis hypertrophic  . Squamous cell carcinoma of skin 07/05/2008   Right lat. lower leg, Right lower leg, Left lower leg  . Squamous cell carcinoma of skin 08/04/2008   Right ant. lower leg, Left med. lower leg  . Squamous cell carcinoma of skin 09/01/2008   Right post. lower leg  . Squamous cell carcinoma of skin 10/04/2008   Left post leg  . Squamous cell carcinoma of skin 12/01/2008   Left lower leg  . Squamous cell carcinoma of skin 03/08/2009   Right ant. mid lower leg, Right lower leg  . Squamous cell carcinoma of skin 05/17/2009   Left lower post. leg superior  . Squamous cell carcinoma of skin 10/11/2009   Left lower leg. KA-pattern  . Squamous cell carcinoma of skin 03/28/2010   Right lower leg  . Squamous cell carcinoma of skin 03/25/2013   Right lower leg  . Squamous cell carcinoma of skin 06/23/2013   Right post. lower leg. Right medial lower leg. Left medial lower leg  . Squamous cell carcinoma of skin 09/09/2013   Left anterior upper arm. Right anterior lower leg. Right lat. lower leg. Right below knee lower leg  . Squamous cell carcinoma of skin 12/08/2013   Right posterior lower leg sup. Right forearm  . Squamous cell carcinoma of skin 03/03/2014   Right posterior leg. Right med. lower leg  . Squamous cell carcinoma of skin 06/22/2014   Right to of shoulder anterior. Right top of shoulder posterior  . Squamous cell carcinoma of skin 08/03/2014   Right anterior forearm  . Squamous cell carcinoma of skin 09/14/2014   Right chest  . Squamous cell carcinoma of skin 10/30/2014   Right calf  . Squamous cell carcinoma of skin 12/14/2014   Right med. distal pretibial sup. Right med distal pretibial inf. Left proximal bicep sup. Left proximal bicep inf.  . Squamous cell carcinoma of skin 03/01/2015   Left lat proximal pretibial near knee  . Squamous cell carcinoma of skin 03/29/2015    Left mid to prox. pretibial. Left mid. lat. pretibial.   . Squamous cell carcinoma of skin 04/19/2015   Right mid calf. Right distal lat. pretibial  . Squamous cell carcinoma of skin 05/31/2015   Right proximal med. pretibial ant. Right proximal med. pretibial posterior  . Squamous cell carcinoma of skin 07/16/2015   Right upper arm inf. deltoid  . Squamous cell carcinoma of skin 08/06/2015   Left distal pretibial  . Squamous cell carcinoma of skin 09/12/2015   Left postauricular area  . Squamous cell carcinoma of skin 12/12/2015   Left proximal pretibial x4 sites  . Squamous cell carcinoma of skin 12/31/2015   Right med. lower leg above ankle sup. Right med. lower leg above ankle inf. Left inf. lat. calf med. Left inf. calf lat.  Marland Kitchen  Squamous cell carcinoma of skin 01/31/2016   Right med. sup. ankle area. Right med mid proximal calf. Left dorsum foot.   . Squamous cell carcinoma of skin 03/17/2016   Left post. distal calf lateral. Left post distal calf med. Left mid lat ant. thigh. Right mid lat. calf  . Squamous cell carcinoma of skin 05/21/2016   Right proximal medial pretibial. Right mid med. pretibial.  . Squamous cell carcinoma of skin 07/03/2016   Right mid med. pretibial. Right proximal med. pretibial  . Squamous cell carcinoma of skin 08/20/2016   Right medial calf. Left distal pretibial. Right upper back paraspinal  . Squamous cell carcinoma of skin 10/30/2016   Right medial calf x2  . Squamous cell carcinoma of skin 12/10/2016   left mid back 6.0cm lat to spine. Left sup. lat. calf. Left inf. post. calf  . Squamous cell carcinoma of skin 12/25/2016   Right medial above ankle. Right mid pretibial. Right prox. pretibial. Right prox. pretibial med.   . Squamous cell carcinoma of skin 01/19/2017   Left forearm. Left ant. neck  . Squamous cell carcinoma of skin 03/23/2017   Left med. pretibial below knee  . Squamous cell carcinoma of skin 04/13/2017   Right lat. sup. ankle.  Right medial ankle  . Squamous cell carcinoma of skin 04/27/2017   Left med. distal prox. calf. Left medial distal calf. Left lat. distal prox. calf. Left lat distal distal calf  . Squamous cell carcinoma of skin 05/14/2017   Left bicep. Left dorsum mid forearm  . Squamous cell carcinoma of skin 06/25/2017   Right med. distal calf x3  . Squamous cell carcinoma of skin 09/02/2017   Left med. infraclavicular  . Squamous cell carcinoma of skin 12/21/2017   Left prox. med. lower leg. Right forearm. Left forearm. Left bicep  . Squamous cell carcinoma of skin 01/07/2018   Left dorsum foot prox. Left dorsum foot distal. Left mid to distal calf. Right chest  . Squamous cell carcinoma of skin 03/08/2018   Left bicep. Right proximal dorsum forearm. Right distal med. pretibial sup. Right distal med pretibial inf.   . Squamous cell carcinoma of skin 04/05/2018   Right sup. med scapula  . Squamous cell carcinoma of skin 06/03/2018   Left med. ankle sup. Left med. ankle inf. Left lateral ankle.  Marland Kitchen Squamous cell carcinoma of skin 09/01/2018   Right mid lat. calf. Right lat. knee. Prox post calf  . Squamous cell carcinoma of skin 11/01/2018   Left lateral ankle  . Squamous cell carcinoma of skin 12/08/2018   Right lateral ankle lat malleolus  . Squamous cell carcinoma of skin 01/05/2019   Right lateral elbow. Left mid medial calf. Left prox. ant. thigh. Right distal lat tricep  . Squamous cell carcinoma of skin 01/19/2019   Right popliteal  . Squamous cell carcinoma of skin 02/03/2019   Left mid lat calf. Left mid calf. Left dorsum lat distal foot. Left dorsum distal foot  . Squamous cell carcinoma of skin 03/30/2019   Right lower leg above med ankle ant. Right lower leg above ankle sup. Right lower leg above the med ankle inf.  . Squamous cell carcinoma of skin 05/12/2019   Right chest medial. Left deltoid  . Squamous cell carcinoma of skin 06/23/2019   Right ant thigh distal above knee. Left  med distal thigh. Left ant thigh above knee  . Squamous cell carcinoma of skin 07/20/2019   Right chest. Right dorsal hand.  Marland Kitchen  Squamous cell carcinoma of skin 08/03/2019   Left distal lat pretibial. Left distal med calf. Right prox. lat calf  . Squamous cell carcinoma of skin 09/05/2019   Right middle bicep. Right sup lat bicep. Right sup mid bicep. Right sup med bicep  . Squamous cell carcinoma of skin 09/15/2019   Right proximal med pretibial. Left distal lat thigh  . Squamous cell carcinoma of skin 04/02/2020   Right calf, bleow right medial knee sup, below right medial knee post, below right medial knee inf  . Squamous cell carcinoma of skin 05/14/2020   Right ant. shoulder. Mod. Diff. SCC. EDC  . Squamous cell carcinoma of skin 06/27/2020   R mid to distal pretibial - ED&C  . Squamous cell carcinoma of skin 06/27/2020   Below the R knee - ED&C   . Squamous cell carcinoma of skin 06/27/2020   R prox pretibial   . Squamous cell carcinoma of skin 06/27/2020   L distal med calf  . Squamous cell carcinoma of skin 08/06/2020   Left ant deltoid    Family History: Family History  Problem Relation Age of Onset  . Breast cancer Sister 108  . Atrial fibrillation Mother     Social History   Socioeconomic History  . Marital status: Married    Spouse name: Not on file  . Number of children: Not on file  . Years of education: Not on file  . Highest education level: Not on file  Occupational History  . Not on file  Tobacco Use  . Smoking status: Never Smoker  . Smokeless tobacco: Never Used  Vaping Use  . Vaping Use: Never used  Substance and Sexual Activity  . Alcohol use: No  . Drug use: No  . Sexual activity: Not on file  Other Topics Concern  . Not on file  Social History Narrative  . Not on file   Social Determinants of Health   Financial Resource Strain: Not on file  Food Insecurity: Not on file  Transportation Needs: Not on file  Physical Activity: Not on file   Stress: Not on file  Social Connections: Not on file  Intimate Partner Violence: Not on file      Review of Systems  Constitutional: Negative for chills, diaphoresis and fatigue.  HENT: Negative for ear pain, postnasal drip and sinus pressure.   Eyes: Negative for photophobia, discharge, redness, itching and visual disturbance.  Respiratory: Negative for cough, shortness of breath and wheezing.   Cardiovascular: Negative for chest pain, palpitations and leg swelling.  Gastrointestinal: Negative for abdominal pain, constipation, diarrhea, nausea and vomiting.  Genitourinary: Negative for dysuria and flank pain.  Musculoskeletal: Negative for arthralgias, back pain, gait problem and neck pain.  Skin: Negative for color change.  Allergic/Immunologic: Negative for environmental allergies and food allergies.  Neurological: Negative for dizziness and headaches.  Hematological: Does not bruise/bleed easily.  Psychiatric/Behavioral: Negative for agitation, behavioral problems (depression) and hallucinations.    Vital Signs: BP 132/80   Pulse 70   Temp 98.6 F (37 C)   Resp 16   Ht 5\' 2"  (1.575 m)   Wt 110 lb (49.9 kg)   SpO2 99%   BMI 20.12 kg/m    Physical Exam Vitals reviewed.  Constitutional:      Appearance: Normal appearance. She is normal weight.  Cardiovascular:     Rate and Rhythm: Normal rate and regular rhythm.     Pulses: Normal pulses.     Heart sounds: Normal heart  sounds.  Pulmonary:     Effort: Pulmonary effort is normal.     Breath sounds: Normal breath sounds.  Abdominal:     General: Abdomen is flat.     Palpations: Abdomen is soft.  Musculoskeletal:        General: Normal range of motion.     Cervical back: Normal range of motion.  Skin:    General: Skin is warm.  Neurological:     General: No focal deficit present.     Mental Status: She is alert and oriented to person, place, and time. Mental status is at baseline.  Psychiatric:        Mood  and Affect: Mood normal.        Behavior: Behavior normal.        Thought Content: Thought content normal.        Judgment: Judgment normal.    Assessment/Plan: 1. Chronic renal failure, stage 3a ([place]) Renal vascular US no evidence of stenosis Will repeat labs at next visit, closely monitor kidney function Consider adding Farxiga at next visit  2. Essential hypertension BP and HR well controlled, will need tight BP control due to decline in kidney function  3. Senile osteoporosis Will need updated BMD when mammogram due this summer  4. B12 deficiency - cyanocobalamin ((VITAMIN B-12)) injection 1,000 mcg  General Counseling: Ryley verbalizes understanding of the findings of todays visit and agrees with plan of treatment. I have discussed any further diagnostic evaluation that may be needed or ordered today. We also reviewed her medications today. she has been encouraged to call the office with any questions or concerns that should arise related to todays visit.  Meds ordered this encounter  Medications  . cyanocobalamin ((VITAMIN B-12)) injection 1,000 mcg    Time spent: 30 Minutes Time spent includes review of chart, medications, test results and follow-up plan with the patient.  This patient was seen by Theodoro Grist AGNP-C in Collaboration with Dr Lavera Guise as a part of collaborative care agreement     Tanna Furry. Damonique Brunelle AGNP-C Internal medicine

## 2020-12-11 ENCOUNTER — Other Ambulatory Visit: Payer: Self-pay | Admitting: Hospice and Palliative Medicine

## 2020-12-13 ENCOUNTER — Ambulatory Visit: Payer: PPO

## 2020-12-16 ENCOUNTER — Encounter: Payer: Self-pay | Admitting: Hospice and Palliative Medicine

## 2020-12-24 ENCOUNTER — Other Ambulatory Visit: Payer: Self-pay | Admitting: Dermatology

## 2020-12-25 ENCOUNTER — Other Ambulatory Visit: Payer: Self-pay

## 2020-12-25 ENCOUNTER — Ambulatory Visit: Payer: PPO | Admitting: Dermatology

## 2020-12-25 DIAGNOSIS — L570 Actinic keratosis: Secondary | ICD-10-CM | POA: Diagnosis not present

## 2020-12-25 DIAGNOSIS — C44729 Squamous cell carcinoma of skin of left lower limb, including hip: Secondary | ICD-10-CM | POA: Diagnosis not present

## 2020-12-25 DIAGNOSIS — Z85828 Personal history of other malignant neoplasm of skin: Secondary | ICD-10-CM | POA: Diagnosis not present

## 2020-12-25 DIAGNOSIS — D485 Neoplasm of uncertain behavior of skin: Secondary | ICD-10-CM

## 2020-12-25 DIAGNOSIS — C44511 Basal cell carcinoma of skin of breast: Secondary | ICD-10-CM | POA: Diagnosis not present

## 2020-12-25 NOTE — Patient Instructions (Addendum)
If you have any questions or concerns for your doctor, please call our main line at [phone] and press option 4 to reach your doctor's medical assistant. If no one answers, please leave a voicemail as directed and we will return your call as soon as possible. Messages left after 4 pm will be answered the following business day.   You may also send [place] a message via [place]. We typically respond to MyChart messages within 1-2 business days.  For prescription refills, please ask your pharmacy to contact our office. Our fax number is [phone].  If you have an urgent issue when the clinic is closed that cannot wait until the next business day, you can page your doctor at the number below.    Please note that while we do our best to be available for urgent issues outside of office hours, we are not available 24/7.   If you have an urgent issue and are unable to reach [place], you may choose to seek medical care at your doctor's office, retail clinic, urgent care center, or emergency room.  If you have a medical emergency, please immediately call 911 or go to the emergency department.  Pager Numbers  - Dr. Nehemiah Massed: [phone]  - Dr. Laurence Ferrari: [phone]  - Dr. Nicole Kindred: [phone]  In the event of inclement weather, please call our main line at [phone] for an update on the status of any delays or closures.  Dermatology Medication Tips: Please keep the boxes that topical medications come in in order to help keep track of the instructions about where and how to use these. Pharmacies typically print the medication instructions only on the boxes and not directly on the medication tubes.   If your medication is too expensive, please contact our office at [phone] option 4 or send [place] a message through [place].   We are unable to tell what your co-pay for medications will be in advance as this is different depending on your insurance coverage. However, we may be able to find a substitute  medication at lower cost or fill out paperwork to get insurance to cover a needed medication.   If a prior authorization is required to get your medication covered by your insurance company, please allow [place] 1-2 business days to complete this process.  Drug prices often vary depending on where the prescription is filled and some pharmacies may offer cheaper prices.  The website www.goodrx.com contains coupons for medications through different pharmacies. The prices here do not account for what the cost may be with help from insurance (it may be cheaper with your insurance), but the website can give you the price if you did not use any insurance.  Wound Care Instructions  1. Cleanse wound gently with soap and water once a day then pat dry with clean gauze. Apply a thing coat of Petrolatum (petroleum jelly, "Vaseline") over the wound (unless you have an allergy to this). We recommend that you use a new, sterile tube of Vaseline. Do not pick or remove scabs. Do not remove the yellow or white "healing tissue" from the base of the wound.  2. Cover the wound with fresh, clean, nonstick gauze and secure with paper tape. You may use Band-Aids in place of gauze and tape if the would is small enough, but would recommend trimming much of the tape off as there is often too much. Sometimes Band-Aids can irritate the skin.  3. You should call the office for your biopsy report after 1 week if  you have not already been contacted.  4. If you experience any problems, such as abnormal amounts of bleeding, swelling, significant bruising, significant pain, or evidence of infection, please call the office immediately.  5. FOR ADULT SURGERY PATIENTS: If you need something for pain relief you may take 1 extra strength Tylenol (acetaminophen) AND 2 Ibuprofen (200mg  each) together every 4 hours as needed for pain. (do not take these if you are allergic to them or if you have a reason you should not take them.) Typically, you  may only need pain medication for 1 to 3 days.    - You can print the associated coupon and take it with your prescription to the pharmacy.  - You may also stop by our office during regular business hours and pick up a GoodRx coupon card.  - If you need your prescription sent electronically to a different pharmacy, notify our office through [facility] or by phone at [phone] option 4.

## 2020-12-25 NOTE — Progress Notes (Signed)
Follow-Up Visit   Subjective  Joann Warren is a 75 y.o. female who presents for the following: Actinic Keratosis (Recheck previously treated lesions on the chest and arms. Patient has other similar lesions on the legs). Patient has a recurrent lesion on her L breast that has been bleeding. Previously bx showed Deltana which was treated with an [facility] in April of last year. She also has a lesion on her L lower leg that is new and irregular, patient is concerned and would like it checked.   The following portions of the chart were reviewed this encounter and updated as appropriate:   Tobacco  Allergies  Meds  Problems  Med Hx  Surg Hx  Fam Hx     Review of Systems:  No other skin or systemic complaints except as noted in HPI or Assessment and Plan.  Objective  Well appearing patient in no apparent distress; mood and affect are within normal limits.  A focused examination was performed including the trunk, arms, legs, and face. Relevant physical exam findings are noted in the Assessment and Plan.  Objective  L lat ankle: 1.5 cm hyperkeratotic papule   Objective  L inf med breast: 1.5 cm pink patch   Objective  B/L legs (20): Erythematous thin papules/macules with gritty scale.    Assessment & Plan  Neoplasm of uncertain behavior of skin (2) L lat ankle  Epidermal / dermal shaving  Lesion diameter (cm):  1.5 Informed consent: discussed and consent obtained   Timeout: patient name, date of birth, surgical site, and procedure verified   Procedure prep:  Patient was prepped and draped in usual sterile fashion Prep type:  Isopropyl alcohol Anesthesia: the lesion was anesthetized in a standard fashion   Anesthetic:  1% lidocaine w/ epinephrine 1-100,000 buffered w/ 8.4% NaHCO3 Instrument used: flexible razor blade   Hemostasis achieved with: pressure, aluminum chloride and electrodesiccation   Outcome: patient tolerated procedure well   Post-procedure details: sterile  dressing applied and wound care instructions given   Dressing type: bandage and petrolatum    Destruction of lesion Complexity: extensive   Destruction method: electrodesiccation and curettage   Informed consent: discussed and consent obtained   Timeout:  patient name, date of birth, surgical site, and procedure verified Procedure prep:  Patient was prepped and draped in usual sterile fashion Prep type:  Isopropyl alcohol Anesthesia: the lesion was anesthetized in a standard fashion   Anesthetic:  1% lidocaine w/ epinephrine 1-100,000 buffered w/ 8.4% NaHCO3 Curettage performed in three different directions: Yes   Electrodesiccation performed over the curetted area: Yes   Lesion length (cm):  1.5 Lesion width (cm):  1.5 Margin per side (cm):  0.2 Final wound size (cm):  1.9 Hemostasis achieved with:  pressure, aluminum chloride and electrodesiccation Outcome: patient tolerated procedure well with no complications   Post-procedure details: sterile dressing applied and wound care instructions given   Dressing type: bandage and petrolatum    Specimen 1 - Surgical pathology Differential Diagnosis: D48.5 r/o SCC  ED&C today  Check Margins: No 1.5 cm hyperkeratotic papule  L inf med breast  Epidermal / dermal shaving  Lesion diameter (cm):  1.5 Informed consent: discussed and consent obtained   Timeout: patient name, date of birth, surgical site, and procedure verified   Procedure prep:  Patient was prepped and draped in usual sterile fashion Prep type:  Isopropyl alcohol Anesthesia: the lesion was anesthetized in a standard fashion   Anesthetic:  1% lidocaine w/ epinephrine  1-100,000 buffered w/ 8.4% NaHCO3 Instrument used: flexible razor blade   Hemostasis achieved with: pressure, aluminum chloride and electrodesiccation   Outcome: patient tolerated procedure well   Post-procedure details: sterile dressing applied and wound care instructions given   Dressing type: bandage and  petrolatum    Destruction of lesion Complexity: extensive   Destruction method: electrodesiccation and curettage   Informed consent: discussed and consent obtained   Timeout:  patient name, date of birth, surgical site, and procedure verified Procedure prep:  Patient was prepped and draped in usual sterile fashion Prep type:  Isopropyl alcohol Anesthesia: the lesion was anesthetized in a standard fashion   Anesthetic:  1% lidocaine w/ epinephrine 1-100,000 buffered w/ 8.4% NaHCO3 Curettage performed in three different directions: Yes   Electrodesiccation performed over the curetted area: Yes   Lesion length (cm):  1.5 Lesion width (cm):  1.5 Margin per side (cm):  0.2 Final wound size (cm):  1.9 Hemostasis achieved with:  pressure, aluminum chloride and electrodesiccation Outcome: patient tolerated procedure well with no complications   Post-procedure details: sterile dressing applied and wound care instructions given   Dressing type: bandage and petrolatum    Specimen 2 - Surgical pathology Differential Diagnosis: D48.5 r/o recurrent BCC  ED&C today  [id number] Check Margins: No 1.5 cm pink patch  Other Related Medications mupirocin ointment (BACTROBAN) 2 %  AK (actinic keratosis) (20) B/L legs  Hypertrophic -   Destruction of lesion - B/L legs Complexity: simple   Destruction method: cryotherapy   Informed consent: discussed and consent obtained   Timeout:  patient name, date of birth, surgical site, and procedure verified Lesion destroyed using liquid nitrogen: Yes   Region frozen until ice ball extended beyond lesion: Yes   Outcome: patient tolerated procedure well with no complications   Post-procedure details: wound care instructions given    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  These are surely sun damage related but may have HPV component too.  We have done "field treatments" in past with Photodynamic therapy with Levulan as well as "5-FU Chemowraps".  Because of the persistent issue, I sent patient for evaluation to [facility] - Oncology to see if she may be a candidate for CAPECITABINE oral (5-FU) chemotherapy - or other agent that patient may benefit from.  She saw Dr Tasia Catchings on 04/10/20 who did not recommend any systemic chemotherapy such as Capecitabine, but advise if she had cancers that were refractory or metastatic, she could be a candidate for immunotherapy such as Cemiplimab. She discussed oral retinoids and oral nicotinamide options. She also discussed possible screening for immunocompromised state with testing for HIV; hepatitis and possible genetic counseling for genetic syndromes predisposing to SCC formation.  - No evidence of recurrence today - No lymphadenopathy - Recommend regular full body skin exams - Recommend daily broad spectrum sunscreen SPF 30+ to sun-exposed areas, reapply every 2 hours as needed.  - Call if any new or changing lesions are noted between office visits  Return in about 3 months (around 03/27/2021).  Luther Redo, CMA, am acting as scribe for Sarina Ser, MD .  Documentation: I have reviewed the above  documentation for accuracy and completeness, and I agree with the above.  Sarina Ser, MD

## 2020-12-31 ENCOUNTER — Telehealth: Payer: Self-pay

## 2020-12-31 NOTE — Telephone Encounter (Signed)
Advised patient's husband of results/hd °

## 2020-12-31 NOTE — Telephone Encounter (Signed)
-----   Message from Ralene Bathe, MD sent at 12/31/2020 12:36 PM EDT ----- Diagnosis 1. Skin , left lateral ankle WELL DIFFERENTIATED SQUAMOUS CELL CARCINOMA, BASE INVOLVED 2. 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

## 2021-01-01 ENCOUNTER — Encounter: Payer: Self-pay | Admitting: Dermatology

## 2021-01-07 ENCOUNTER — Ambulatory Visit (INDEPENDENT_AMBULATORY_CARE_PROVIDER_SITE_OTHER): Payer: PPO

## 2021-01-07 ENCOUNTER — Other Ambulatory Visit: Payer: Self-pay

## 2021-01-07 DIAGNOSIS — E538 Deficiency of other specified B group vitamins: Secondary | ICD-10-CM

## 2021-01-07 MED ORDER — CYANOCOBALAMIN 1000 MCG/ML IJ SOLN
1000.0000 ug | Freq: Once | INTRAMUSCULAR | Status: AC
  Administered 2021-01-07: 1000 ug via INTRAMUSCULAR

## 2021-01-08 ENCOUNTER — Other Ambulatory Visit: Payer: Self-pay | Admitting: Internal Medicine

## 2021-02-07 ENCOUNTER — Other Ambulatory Visit: Payer: Self-pay

## 2021-02-07 ENCOUNTER — Ambulatory Visit: Payer: PPO

## 2021-02-07 ENCOUNTER — Ambulatory Visit (INDEPENDENT_AMBULATORY_CARE_PROVIDER_SITE_OTHER): Payer: PPO

## 2021-02-07 DIAGNOSIS — E538 Deficiency of other specified B group vitamins: Secondary | ICD-10-CM

## 2021-02-07 MED ORDER — CYANOCOBALAMIN 1000 MCG/ML IJ SOLN
1000.0000 ug | Freq: Once | INTRAMUSCULAR | Status: AC
  Administered 2021-02-07: 1000 ug via INTRAMUSCULAR

## 2021-02-25 ENCOUNTER — Other Ambulatory Visit: Payer: Self-pay | Admitting: Internal Medicine

## 2021-02-25 DIAGNOSIS — Z1231 Encounter for screening mammogram for malignant neoplasm of breast: Secondary | ICD-10-CM

## 2021-03-11 DIAGNOSIS — H401112 Primary open-angle glaucoma, right eye, moderate stage: Secondary | ICD-10-CM | POA: Diagnosis not present

## 2021-03-12 ENCOUNTER — Ambulatory Visit (INDEPENDENT_AMBULATORY_CARE_PROVIDER_SITE_OTHER): Payer: PPO

## 2021-03-12 DIAGNOSIS — E538 Deficiency of other specified B group vitamins: Secondary | ICD-10-CM

## 2021-03-12 MED ORDER — CYANOCOBALAMIN 1000 MCG/ML IJ SOLN
1000.0000 ug | Freq: Once | INTRAMUSCULAR | Status: AC
Start: 2021-03-12 — End: 2021-03-12
  Administered 2021-03-12: 1000 ug via INTRAMUSCULAR

## 2021-03-27 ENCOUNTER — Other Ambulatory Visit: Payer: Self-pay

## 2021-03-27 ENCOUNTER — Ambulatory Visit: Payer: PPO | Admitting: Dermatology

## 2021-03-27 DIAGNOSIS — C4492 Squamous cell carcinoma of skin, unspecified: Secondary | ICD-10-CM

## 2021-03-27 DIAGNOSIS — C44729 Squamous cell carcinoma of skin of left lower limb, including hip: Secondary | ICD-10-CM

## 2021-03-27 DIAGNOSIS — L578 Other skin changes due to chronic exposure to nonionizing radiation: Secondary | ICD-10-CM

## 2021-03-27 DIAGNOSIS — D485 Neoplasm of uncertain behavior of skin: Secondary | ICD-10-CM

## 2021-03-27 DIAGNOSIS — D492 Neoplasm of unspecified behavior of bone, soft tissue, and skin: Secondary | ICD-10-CM

## 2021-03-27 DIAGNOSIS — L299 Pruritus, unspecified: Secondary | ICD-10-CM

## 2021-03-27 HISTORY — DX: Squamous cell carcinoma of skin, unspecified: C44.92

## 2021-03-27 MED ORDER — HYDROXYZINE HCL 10 MG PO TABS: ORAL_TABLET | ORAL | 4 refills | Status: DC

## 2021-03-27 MED ORDER — MUPIROCIN 2 % EX OINT: TOPICAL_OINTMENT | CUTANEOUS | 0 refills | Status: DC

## 2021-03-27 NOTE — Progress Notes (Signed)
Follow-Up Visit   Subjective  Joann Warren is a 75 y.o. female who presents for the following: Skin Problem (Pt c/o  growths on her left leg growing and changing she would like removed today ). Refill Hydroxyzine tablets for itching.   The following portions of the chart were reviewed this encounter and updated as appropriate:   Tobacco  Allergies  Meds  Problems  Med Hx  Surg Hx  Fam Hx     Review of Systems:  No other skin or systemic complaints except as noted in HPI or Assessment and Plan.  Objective  Well appearing patient in no apparent distress; mood and affect are within normal limits.  A focused examination was performed including face, left leg. Relevant physical exam findings are noted in the Assessment and Plan.  left knee 1.2 keratotic papule   Left lateral lower leg 1.8 cm keratotic papule   arms, legs Excoriations    Assessment & Plan  Neoplasm of skin (2) left knee  Epidermal / dermal shaving  Lesion diameter (cm):  1.2 Informed consent: discussed and consent obtained   Timeout: patient name, date of birth, surgical site, and procedure verified   Procedure prep:  Patient was prepped and draped in usual sterile fashion Prep type:  Isopropyl alcohol Anesthesia: the lesion was anesthetized in a standard fashion   Anesthetic:  1% lidocaine w/ epinephrine 1-100,000 buffered w/ 8.4% NaHCO3 Hemostasis achieved with: pressure, aluminum chloride and electrodesiccation   Outcome: patient tolerated procedure well   Post-procedure details: sterile dressing applied and wound care instructions given   Dressing type: bandage and petrolatum    Destruction of lesion Complexity: extensive   Destruction method: electrodesiccation and curettage   Informed consent: discussed and consent obtained   Timeout:  patient name, date of birth, surgical site, and procedure verified Procedure prep:  Patient was prepped and draped in usual sterile fashion Prep type:   Isopropyl alcohol Anesthesia: the lesion was anesthetized in a standard fashion   Anesthetic:  1% lidocaine w/ epinephrine 1-100,000 buffered w/ 8.4% NaHCO3 Curettage performed in three different directions: Yes   Electrodesiccation performed over the curetted area: Yes   Lesion length (cm):  1.2 Lesion width (cm):  1.2 Margin per side (cm):  0.2 Final wound size (cm):  1.6 Hemostasis achieved with:  pressure, aluminum chloride and electrodesiccation Outcome: patient tolerated procedure well with no complications   Post-procedure details: sterile dressing applied and wound care instructions given   Dressing type: bandage and petrolatum    Specimen 1 - Surgical pathology Differential Diagnosis: R/O SCC  Check Margins: No  Left lateral lower leg  Epidermal / dermal shaving  Lesion diameter (cm):  1.8 Informed consent: discussed and consent obtained   Timeout: patient name, date of birth, surgical site, and procedure verified   Procedure prep:  Patient was prepped and draped in usual sterile fashion Prep type:  Isopropyl alcohol Anesthesia: the lesion was anesthetized in a standard fashion   Anesthetic:  1% lidocaine w/ epinephrine 1-100,000 buffered w/ 8.4% NaHCO3 Hemostasis achieved with: pressure, aluminum chloride and electrodesiccation   Outcome: patient tolerated procedure well   Post-procedure details: sterile dressing applied and wound care instructions given   Dressing type: bandage and petrolatum    Destruction of lesion  Destruction method: electrodesiccation and curettage   Informed consent: discussed and consent obtained   Timeout:  patient name, date of birth, surgical site, and procedure verified Anesthesia: the lesion was anesthetized in a standard fashion  Anesthetic:  1% lidocaine w/ epinephrine 1-100,000 buffered w/ 8.4% NaHCO3 Curettage performed in three different directions: Yes   Electrodesiccation performed over the curetted area: Yes   Curettage  cycles:  3 Lesion length (cm):  1.8 Lesion width (cm):  1.8 Margin per side (cm):  0.2 Final wound size (cm):  2.2 Hemostasis achieved with:  electrodesiccation Outcome: patient tolerated procedure well with no complications   Post-procedure details: sterile dressing applied and wound care instructions given   Dressing type: petrolatum    Specimen 2 - Surgical pathology Differential Diagnosis: R/O SCC  Check Margins: No  Related Medications mupirocin ointment (BACTROBAN) 2 % Apply to skin qd-bid  Neoplasm of uncertain behavior of skin Related Medications mupirocin ointment (BACTROBAN) 2 % Apply topically daily.  Pruritus arms, legs Chronic and persistent  Cont Hydroxyzine 10 mg tablets at bedtime prn itching  Related Medications hydrOXYzine (ATARAX/VISTARIL) 10 MG tablet TAKE 1 TABLET BY MOUTH ONCE TO TWICE DAILY AT NIGHT AS NEEDED FOR ITCHING  Actinic Damage - chronic, secondary to cumulative UV radiation exposure/sun exposure over time - diffuse scaly erythematous macules with underlying dyspigmentation - Recommend daily broad spectrum sunscreen SPF 30+ to sun-exposed areas, reapply every 2 hours as needed.  - Recommend staying in the shade or wearing long sleeves, sun glasses (UVA+UVB protection) and wide brim hats (4-inch brim around the entire circumference of the hat). - Call for new or changing lesions.  Return in about 3 months (around 06/27/2021) for hx of skin cancer .  IMarye Round, CMA, am acting as scribe for Sarina Ser, MD .  Documentation: I have reviewed the above documentation for accuracy and completeness, and I agree with the above.  Sarina Ser, MD

## 2021-03-27 NOTE — Patient Instructions (Addendum)
Wound Care Instructions  Cleanse wound gently with soap and water once a day then pat dry with clean gauze. Apply a thing coat of Petrolatum (petroleum jelly, "Vaseline") over the wound (unless you have an allergy to this). We recommend that you use a new, sterile tube of Vaseline. Do not pick or remove scabs. Do not remove the yellow or white "healing tissue" from the base of the wound.  Cover the wound with fresh, clean, nonstick gauze and secure with paper tape. You may use Band-Aids in place of gauze and tape if the would is small enough, but would recommend trimming much of the tape off as there is often too much. Sometimes Band-Aids can irritate the skin.  You should call the office for your biopsy report after 1 week if you have not already been contacted.  If you experience any problems, such as abnormal amounts of bleeding, swelling, significant bruising, significant pain, or evidence of infection, please call the office immediately.  FOR ADULT SURGERY PATIENTS: If you need something for pain relief you may take 1 extra strength Tylenol (acetaminophen) AND 2 Ibuprofen (200mg each) together every 4 hours as needed for pain. (do not take these if you are allergic to them or if you have a reason you should not take them.) Typically, you may only need pain medication for 1 to 3 days.   If you have any questions or concerns for your doctor, please call our main line at [phone] and press option 4 to reach your doctor's medical assistant. If no one answers, please leave a voicemail as directed and we will return your call as soon as possible. Messages left after 4 pm will be answered the following business day.   You may also send us a message via MyChart. We typically respond to MyChart messages within 1-2 business days.  For prescription refills, please ask your pharmacy to contact our office. Our fax number is [phone].  If you have an urgent issue when the clinic is closed that  cannot wait until the next business day, you can page your doctor at the number below.    Please note that while we do our best to be available for urgent issues outside of office hours, we are not available 24/7.   If you have an urgent issue and are unable to reach us, you may choose to seek medical care at your doctor's office, retail clinic, urgent care center, or emergency room.  If you have a medical emergency, please immediately call 911 or go to the emergency department.  Pager Numbers  - Dr. Kowalski: [phone]  - Dr. Moye: [phone]  - Dr. Stewart: [phone]  In the event of inclement weather, please call our main line at [phone] for an update on the status of any delays or closures.  Dermatology Medication Tips: Please keep the boxes that topical medications come in in order to help keep track of the instructions about where and how to use these. Pharmacies typically print the medication instructions only on the boxes and not directly on the medication tubes.   If your medication is too expensive, please contact our office at [phone] option 4 or send us a message through MyChart.   We are unable to tell what your co-pay for medications will be in advance as this is different depending on your insurance coverage. However, we may be able to find a substitute medication at lower cost or fill out paperwork to get insurance to cover a needed   medication.   If a prior authorization is required to get your medication covered by your insurance company, please allow us 1-2 business days to complete this process.  Drug prices often vary depending on where the prescription is filled and some pharmacies may offer cheaper prices.  The website www.goodrx.com contains coupons for medications through different pharmacies. The prices here do not account for what the cost may be with help from insurance (it may be cheaper with your insurance), but the website can give you the  price if you did not use any insurance.  - You can print the associated coupon and take it with your prescription to the pharmacy.  - You may also stop by our office during regular business hours and pick up a GoodRx coupon card.  - If you need your prescription sent electronically to a different pharmacy, notify our office through [facility] MyChart or by phone at [phone] option 4.   

## 2021-03-28 ENCOUNTER — Encounter: Payer: Self-pay | Admitting: Physician Assistant

## 2021-03-28 ENCOUNTER — Telehealth: Payer: Self-pay

## 2021-03-28 ENCOUNTER — Telehealth (INDEPENDENT_AMBULATORY_CARE_PROVIDER_SITE_OTHER): Payer: PPO | Admitting: Physician Assistant

## 2021-03-28 VITALS — Temp 97.7°F | Resp 16 | Ht 62.0 in | Wt 106.0 lb

## 2021-03-28 DIAGNOSIS — Z23 Encounter for immunization: Secondary | ICD-10-CM

## 2021-03-28 DIAGNOSIS — U071 COVID-19: Secondary | ICD-10-CM

## 2021-03-28 MED ORDER — PREDNISONE 10 MG PO TABS: ORAL_TABLET | ORAL | 0 refills | Status: DC

## 2021-03-28 MED ORDER — AZITHROMYCIN 250 MG PO TABS: ORAL_TABLET | ORAL | 0 refills | Status: DC

## 2021-03-28 MED ORDER — TETANUS-DIPHTH-ACELL PERTUSSIS 5-2.5-18.5 LF-MCG/0.5 IM SUSP: 0.5000 mL | Freq: Once | INTRAMUSCULAR | 0 refills | Status: AC

## 2021-03-28 NOTE — Telephone Encounter (Signed)
Pt called that she positive for covid  yesterday make her virtual appt for today with lauren

## 2021-03-28 NOTE — Progress Notes (Signed)
[facility] [place], [place] [address]  Internal MEDICINE  Telephone Visit  Patient Name: Joann Warren  [id number]  [id number]  Date of Service: 03/28/2021  I connected with the patient at 10:07 by telephone and verified the patients identity using two identifiers.   I discussed the limitations, risks, security and privacy concerns of performing an evaluation and management service by telephone and the availability of in person appointments. I also discussed with the patient that there may be a patient responsible charge related to the service.  The patient expressed understanding and agrees to proceed.    Chief Complaint  Patient presents with   Follow-up    Covid pos. Yesterday, last night sore throat, fever, runny nose, cough with phlegm, and fatigued   Telephone Assessment    Phone call   Telephone Screen    [phone]    HPI Pt is here for a virtual sick visit -She tested positive for covid yesterday -Symptoms started with sore throat on Tuesday. Both her and her husband felt like they had a cold yesterday with fatigue, body aches, cough, and both tested positive. States her husband is feeling worse than she is. Denies nay SOB or wheezing. -Has tried tylenol as needed -Discussed treatment options and the importance of getting good rest and staying well hydrated. -She received her 4th covid shot and her PNA vaccine last week.  Current Medication: Outpatient Encounter Medications as of 03/28/2021  Medication Sig   alendronate (FOSAMAX) 70 MG tablet TAKE 1 TABLET EVERY 7 DAYS WITH A FULL GLASS OF WATER ON AN EMPTY STOMACH DO NOT LIE DOWN FOR AT LEAST 30 MIN   amLODipine (NORVASC) 5 MG tablet Take 1 tablet (5 mg total) by mouth daily.   azithromycin (ZITHROMAX) 250 MG tablet Take one tab a day for 10 days for uri   bimatoprost (LUMIGAN) 0.03 % ophthalmic solution    bisoprolol-hydrochlorothiazide (ZIAC) 2.5-6.25 MG tablet TAKE ONE TABLET EVERY  MORNING   hydrocortisone 2.5 % cream APPLY TO AFFECTED AREAS ON THE SKIN EVERYDAY FOR UP TO 5 DAYS PER WEEK   hydrOXYzine (ATARAX/VISTARIL) 10 MG tablet TAKE 1 TABLET BY MOUTH ONCE TO TWICE DAILY AT NIGHT AS NEEDED FOR ITCHING   mupirocin ointment (BACTROBAN) 2 % Apply topically daily.   mupirocin ointment (BACTROBAN) 2 % Apply to skin qd-bid   predniSONE (DELTASONE) 10 MG tablet Take one tab 3 x day for 3 days, then take one tab 2 x a day for 3 days and then take one tab a day for 3 days for copd   simvastatin (ZOCOR) 20 MG tablet Take 1 tablet (20 mg total) by mouth at bedtime.   triamcinolone (KENALOG) 0.1 % APPLY ONCE DAILY AS NEEDED   [DISCONTINUED] Tdap (BOOSTRIX) 5-2.5-18.5 LF-MCG/0.5 injection Inject 0.5 mLs into the muscle once.   Tdap (BOOSTRIX) 5-2.5-18.5 LF-MCG/0.5 injection Inject 0.5 mLs into the muscle once for 1 dose.   No facility-administered encounter medications on file as of 03/28/2021.    Surgical History: Past Surgical History:  Procedure Laterality Date   BREAST EXCISIONAL BIOPSY Left 1972   neg   BREAST EXCISIONAL BIOPSY Right 1980   neg   COLONOSCOPY     ESOPHAGOGASTRODUODENOSCOPY (EGD) WITH PROPOFOL N/A 04/09/2015   Procedure: ESOPHAGOGASTRODUODENOSCOPY (EGD) WITH PROPOFOL;  Surgeon: Manya Silvas, MD;  Location: [facility];  Service: Endoscopy;  Laterality: N/A;   skin cancer removal      Medical History: Past Medical History:  Diagnosis Date  Actinic keratosis    Anemia    Basal cell carcinoma 12/23/2007   Upper back.   Basal cell carcinoma 05/17/2009   Right med lower leg   Basal cell carcinoma 07/06/2014   Left medial breast   Basal cell carcinoma 03/01/2015   Right medial mid pretibial   Basal cell carcinoma 07/16/2015   Right superior breast   Basal cell carcinoma 05/21/2016   Left inferior medial breast   Basal cell carcinoma 02/08/2018   Left medial breast   Basal cell carcinoma 11/01/2018   Left medial breast   Basal cell  carcinoma 03/30/2019   Left med inf breast   Basal cell carcinoma 09/05/2019   Left proximal bicep   Basal cell carcinoma 12/08/2019   Right forehead. Nodular pattern.   Basal cell carcinoma 12/13/2019   Left med. breast inf. Nodular and infiltrative patterns. EDC   Basal cell carcinoma 12/13/2019   Left med. breast. sup. Nodular pattern. EDC   Basal cell carcinoma 05/14/2020   Right upper back. BCC with focal sclerosis. EDC.   Basal cell carcinoma 12/25/2020   Left inf med breast EDC   Cancer ([place])    squamous cell   Glaucoma    Hyperlipidemia    Hypertension    Osteoporosis    Squamous cell carcinoma of skin 12/08/2007   Left lower leg. WD SCC   Squamous cell carcinoma of skin 02/01/2008   Left distal med. pretibial. WD SCC   Squamous cell carcinoma of skin 02/01/2008   Right mid pretibial. WD SCC   Squamous cell carcinoma of skin 02/01/2008   Right mid pretibal inferior. WD SCC with superficial infiltration.   Squamous cell carcinoma of skin 04/05/2008   Left dorsum hand. WD SCC with superficial infiltration.   Squamous cell carcinoma of skin 06/15/2008   Right lower leg inferior. WD SCC   Squamous cell carcinoma of skin 06/15/2008   Right lower leg superior. SCCis hypertrophic   Squamous cell carcinoma of skin 07/05/2008   Right lat. lower leg, Right lower leg, Left lower leg   Squamous cell carcinoma of skin 08/04/2008   Right ant. lower leg, Left med. lower leg   Squamous cell carcinoma of skin 09/01/2008   Right post. lower leg   Squamous cell carcinoma of skin 10/04/2008   Left post leg   Squamous cell carcinoma of skin 12/01/2008   Left lower leg   Squamous cell carcinoma of skin 03/08/2009   Right ant. mid lower leg, Right lower leg   Squamous cell carcinoma of skin 05/17/2009   Left lower post. leg superior   Squamous cell carcinoma of skin 10/11/2009   Left lower leg. KA-pattern   Squamous cell carcinoma of skin 03/28/2010   Right lower leg   Squamous  cell carcinoma of skin 03/25/2013   Right lower leg   Squamous cell carcinoma of skin 06/23/2013   Right post. lower leg. Right medial lower leg. Left medial lower leg   Squamous cell carcinoma of skin 09/09/2013   Left anterior upper arm. Right anterior lower leg. Right lat. lower leg. Right below knee lower leg   Squamous cell carcinoma of skin 12/08/2013   Right posterior lower leg sup. Right forearm   Squamous cell carcinoma of skin 03/03/2014   Right posterior leg. Right med. lower leg   Squamous cell carcinoma of skin 06/22/2014   Right to of shoulder anterior. Right top of shoulder posterior   Squamous cell carcinoma of skin 08/03/2014   Right  anterior forearm   Squamous cell carcinoma of skin 09/14/2014   Right chest   Squamous cell carcinoma of skin 10/30/2014   Right calf   Squamous cell carcinoma of skin 12/14/2014   Right med. distal pretibial sup. Right med distal pretibial inf. Left proximal bicep sup. Left proximal bicep inf.   Squamous cell carcinoma of skin 03/01/2015   Left lat proximal pretibial near knee   Squamous cell carcinoma of skin 03/29/2015   Left mid to prox. pretibial. Left mid. lat. pretibial.    Squamous cell carcinoma of skin 04/19/2015   Right mid calf. Right distal lat. pretibial   Squamous cell carcinoma of skin 05/31/2015   Right proximal med. pretibial ant. Right proximal med. pretibial posterior   Squamous cell carcinoma of skin 07/16/2015   Right upper arm inf. deltoid   Squamous cell carcinoma of skin 08/06/2015   Left distal pretibial   Squamous cell carcinoma of skin 09/12/2015   Left postauricular area   Squamous cell carcinoma of skin 12/12/2015   Left proximal pretibial x4 sites   Squamous cell carcinoma of skin 12/31/2015   Right med. lower leg above ankle sup. Right med. lower leg above ankle inf. Left inf. lat. calf med. Left inf. calf lat.   Squamous cell carcinoma of skin 01/31/2016   Right med. sup. ankle area. Right med mid  proximal calf. Left dorsum foot.    Squamous cell carcinoma of skin 03/17/2016   Left post. distal calf lateral. Left post distal calf med. Left mid lat ant. thigh. Right mid lat. calf   Squamous cell carcinoma of skin 05/21/2016   Right proximal medial pretibial. Right mid med. pretibial.   Squamous cell carcinoma of skin 07/03/2016   Right mid med. pretibial. Right proximal med. pretibial   Squamous cell carcinoma of skin 08/20/2016   Right medial calf. Left distal pretibial. Right upper back paraspinal   Squamous cell carcinoma of skin 10/30/2016   Right medial calf x2   Squamous cell carcinoma of skin 12/10/2016   left mid back 6.0cm lat to spine. Left sup. lat. calf. Left inf. post. calf   Squamous cell carcinoma of skin 12/25/2016   Right medial above ankle. Right mid pretibial. Right prox. pretibial. Right prox. pretibial med.    Squamous cell carcinoma of skin 01/19/2017   Left forearm. Left ant. neck   Squamous cell carcinoma of skin 03/23/2017   Left med. pretibial below knee   Squamous cell carcinoma of skin 04/13/2017   Right lat. sup. ankle. Right medial ankle   Squamous cell carcinoma of skin 04/27/2017   Left med. distal prox. calf. Left medial distal calf. Left lat. distal prox. calf. Left lat distal distal calf   Squamous cell carcinoma of skin 05/14/2017   Left bicep. Left dorsum mid forearm   Squamous cell carcinoma of skin 06/25/2017   Right med. distal calf x3   Squamous cell carcinoma of skin 09/02/2017   Left med. infraclavicular   Squamous cell carcinoma of skin 12/21/2017   Left prox. med. lower leg. Right forearm. Left forearm. Left bicep   Squamous cell carcinoma of skin 01/07/2018   Left dorsum foot prox. Left dorsum foot distal. Left mid to distal calf. Right chest   Squamous cell carcinoma of skin 03/08/2018   Left bicep. Right proximal dorsum forearm. Right distal med. pretibial sup. Right distal med pretibial inf.    Squamous cell carcinoma of skin  04/05/2018   Right sup. med scapula  Squamous cell carcinoma of skin 06/03/2018   Left med. ankle sup. Left med. ankle inf. Left lateral ankle.   Squamous cell carcinoma of skin 09/01/2018   Right mid lat. calf. Right lat. knee. Prox post calf   Squamous cell carcinoma of skin 11/01/2018   Left lateral ankle   Squamous cell carcinoma of skin 12/08/2018   Right lateral ankle lat malleolus   Squamous cell carcinoma of skin 01/05/2019   Right lateral elbow. Left mid medial calf. Left prox. ant. thigh. Right distal lat tricep   Squamous cell carcinoma of skin 01/19/2019   Right popliteal   Squamous cell carcinoma of skin 02/03/2019   Left mid lat calf. Left mid calf. Left dorsum lat distal foot. Left dorsum distal foot   Squamous cell carcinoma of skin 03/30/2019   Right lower leg above med ankle ant. Right lower leg above ankle sup. Right lower leg above the med ankle inf.   Squamous cell carcinoma of skin 05/12/2019   Right chest medial. Left deltoid   Squamous cell carcinoma of skin 06/23/2019   Right ant thigh distal above knee. Left med distal thigh. Left ant thigh above knee   Squamous cell carcinoma of skin 07/20/2019   Right chest. Right dorsal hand.   Squamous cell carcinoma of skin 08/03/2019   Left distal lat pretibial. Left distal med calf. Right prox. lat calf   Squamous cell carcinoma of skin 09/05/2019   Right middle bicep. Right sup lat bicep. Right sup mid bicep. Right sup med bicep   Squamous cell carcinoma of skin 09/15/2019   Right proximal med pretibial. Left distal lat thigh   Squamous cell carcinoma of skin 04/02/2020   Right calf, bleow right medial knee sup, below right medial knee post, below right medial knee inf   Squamous cell carcinoma of skin 05/14/2020   Right ant. shoulder. Mod. Diff. SCC. EDC   Squamous cell carcinoma of skin 06/27/2020   R mid to distal pretibial - ED&C   Squamous cell carcinoma of skin 06/27/2020   Below the R knee - ED&C     Squamous cell carcinoma of skin 06/27/2020   R prox pretibial    Squamous cell carcinoma of skin 06/27/2020   L distal med calf   Squamous cell carcinoma of skin 08/06/2020   Left ant deltoid   Squamous cell carcinoma of skin 12/25/2020   Left lat ankle EDC    Family History: Family History  Problem Relation Age of Onset   Breast cancer Sister 62   Atrial fibrillation Mother     Social History   Socioeconomic History   Marital status: Married    Spouse name: Not on file   Number of children: Not on file   Years of education: Not on file   Highest education level: Not on file  Occupational History   Not on file  Tobacco Use   Smoking status: Never   Smokeless tobacco: Never  Vaping Use   Vaping Use: Never used  Substance and Sexual Activity   Alcohol use: No   Drug use: No   Sexual activity: Not on file  Other Topics Concern   Not on file  Social History Narrative   Not on file   Social Determinants of Health   Financial Resource Strain: Not on file  Food Insecurity: Not on file  Transportation Needs: Not on file  Physical Activity: Not on file  Stress: Not on file  Social Connections: Not on file  Intimate Partner Violence: Not on file      Review of Systems  Constitutional:  Positive for fatigue and fever.  HENT:  Positive for postnasal drip. Negative for congestion and mouth sores.   Respiratory:  Positive for cough. Negative for shortness of breath and wheezing.   Cardiovascular:  Negative for chest pain.  Genitourinary:  Negative for flank pain.  Musculoskeletal:  Positive for myalgias.  Psychiatric/Behavioral: Negative.     Vital Signs: Temp 97.7 F (36.5 C)   Resp 16   Ht '5\' 2"'$  (1.575 m)   Wt 106 lb (48.1 kg)   BMI 19.39 kg/m    Observation/Objective:  Pt is  able to carry out conversation   Assessment/Plan: 1. Acute COVID-19 Discussed that patient may take prednisone taper and zpak to help with lessening symptoms. Since patient  states she is actually feeling ok and does not have any hx of COPD or lung concerns she may hold off on starting medication unless she doesn't feel better/starts feeling worse. Discussed that should she start the medications to take the entire course and to not hesitate to take them if any worsening. Educated to stay well hydrated and rest. - predniSONE (DELTASONE) 10 MG tablet; Take one tab 3 x day for 3 days, then take one tab 2 x a day for 3 days and then take one tab a day for 3 days for copd  Dispense: 18 tablet; Refill: 0 - azithromycin (ZITHROMAX) 250 MG tablet; Take one tab a day for 10 days for uri  Dispense: 10 tablet; Refill: 0  2. Need for tetanus booster - Tdap (BOOSTRIX) 5-2.5-18.5 LF-MCG/0.5 injection; Inject 0.5 mLs into the muscle once for 1 dose.  Dispense: 0.5 mL; Refill: 0   General Counseling: Cai verbalizes understanding of the findings of today's phone visit and agrees with plan of treatment. I have discussed any further diagnostic evaluation that may be needed or ordered today. We also reviewed her medications today. she has been encouraged to call the office with any questions or concerns that should arise related to todays visit.    No orders of the defined types were placed in this encounter.   Meds ordered this encounter  Medications   Tdap (BOOSTRIX) 5-2.5-18.5 LF-MCG/0.5 injection    Sig: Inject 0.5 mLs into the muscle once for 1 dose.    Dispense:  0.5 mL    Refill:  0   predniSONE (DELTASONE) 10 MG tablet    Sig: Take one tab 3 x day for 3 days, then take one tab 2 x a day for 3 days and then take one tab a day for 3 days for copd    Dispense:  18 tablet    Refill:  0   azithromycin (ZITHROMAX) 250 MG tablet    Sig: Take one tab a day for 10 days for uri    Dispense:  10 tablet    Refill:  0    Time spent:30 Minutes    Dr Lavera Guise Internal medicine

## 2021-04-01 ENCOUNTER — Ambulatory Visit: Payer: PPO | Admitting: Nurse Practitioner

## 2021-04-02 ENCOUNTER — Telehealth: Payer: Self-pay

## 2021-04-02 ENCOUNTER — Encounter: Payer: Self-pay | Admitting: Dermatology

## 2021-04-02 NOTE — Telephone Encounter (Signed)
Discussed biopsy results with pt  °

## 2021-04-02 NOTE — Telephone Encounter (Signed)
-----   Message from Ralene Bathe, MD sent at 04/02/2021  9:40 AM EDT ----- Diagnosis 1. Skin , left knee WELL DIFFERENTIATED SQUAMOUS CELL CARCINOMA, BASE INVOLVED 2. Skin , left lateral lower leg WELL DIFFERENTIATED SQUAMOUS CELL CARCINOMA, BASE INVOLVED  1&2 - both Cancer - SCC Both already treated Recheck next visit

## 2021-04-12 ENCOUNTER — Ambulatory Visit (INDEPENDENT_AMBULATORY_CARE_PROVIDER_SITE_OTHER): Payer: PPO

## 2021-04-12 ENCOUNTER — Other Ambulatory Visit: Payer: Self-pay

## 2021-04-12 DIAGNOSIS — E538 Deficiency of other specified B group vitamins: Secondary | ICD-10-CM

## 2021-04-12 MED ORDER — CYANOCOBALAMIN 1000 MCG/ML IJ SOLN
1000.0000 ug | Freq: Once | INTRAMUSCULAR | Status: AC
Start: 2021-04-12 — End: 2021-04-12
  Administered 2021-04-12: 1000 ug via INTRAMUSCULAR

## 2021-04-26 ENCOUNTER — Other Ambulatory Visit: Payer: Self-pay

## 2021-04-26 ENCOUNTER — Encounter: Payer: Self-pay | Admitting: Nurse Practitioner

## 2021-04-26 ENCOUNTER — Ambulatory Visit (INDEPENDENT_AMBULATORY_CARE_PROVIDER_SITE_OTHER): Payer: PPO | Admitting: Nurse Practitioner

## 2021-04-26 VITALS — BP 120/72 | HR 66 | Temp 98.3°F | Resp 16 | Ht 62.0 in | Wt 107.2 lb

## 2021-04-26 DIAGNOSIS — R300 Dysuria: Secondary | ICD-10-CM | POA: Diagnosis not present

## 2021-04-26 DIAGNOSIS — M810 Age-related osteoporosis without current pathological fracture: Secondary | ICD-10-CM | POA: Diagnosis not present

## 2021-04-26 DIAGNOSIS — E538 Deficiency of other specified B group vitamins: Secondary | ICD-10-CM | POA: Diagnosis not present

## 2021-04-26 DIAGNOSIS — Z0001 Encounter for general adult medical examination with abnormal findings: Secondary | ICD-10-CM

## 2021-04-26 DIAGNOSIS — N1832 Chronic kidney disease, stage 3b: Secondary | ICD-10-CM

## 2021-04-26 DIAGNOSIS — Z23 Encounter for immunization: Secondary | ICD-10-CM

## 2021-04-26 DIAGNOSIS — I10 Essential (primary) hypertension: Secondary | ICD-10-CM

## 2021-04-26 DIAGNOSIS — E785 Hyperlipidemia, unspecified: Secondary | ICD-10-CM | POA: Diagnosis not present

## 2021-04-26 DIAGNOSIS — E782 Mixed hyperlipidemia: Secondary | ICD-10-CM | POA: Diagnosis not present

## 2021-04-26 MED ORDER — PREVNAR 20 0.5 ML IM SUSY: 0.5000 mL | PREFILLED_SYRINGE | Freq: Once | INTRAMUSCULAR | 0 refills | Status: AC

## 2021-04-26 MED ORDER — TETANUS-DIPHTH-ACELL PERTUSSIS 5-2.5-18.5 LF-MCG/0.5 IM SUSP: 0.5000 mL | Freq: Once | INTRAMUSCULAR | 0 refills | Status: AC

## 2021-04-26 NOTE — Progress Notes (Signed)
[facility] [place], [place] [address]  Internal MEDICINE  Office Visit Note  Patient Name: Joann Warren  [id number]  [id number]  Date of Service: 04/26/2021  Chief Complaint  Patient presents with   Medicare Wellness   Hypertension   Hyperlipidemia   Anemia    HPI Myleen presents for an annual well visit and physical exam. she has a history of anemia, glaucoma, osteoporosis, hyperlipidemia, hypertension, and many squamous cell and basal cell carcinoma lesioms that have been removed. She has received 4 doses of the covid vaccine. She has her colonoscopy in 2020. She is due for her mammogram and she had her BMD screening in 2020. Her mammogram is scheduled for next Tuesday. She lives at hme with her husband. She is a retired Barista.  She denies any pain. For her osteoporosis, she has been taking fosamax for 4 years. The recommended treatment time is 5 years with fosamax.  She denies any pain, and has no other questions or concerns today.    Current Medication: Outpatient Encounter Medications as of 04/26/2021  Medication Sig   alendronate (FOSAMAX) 70 MG tablet TAKE 1 TABLET EVERY 7 DAYS WITH A FULL GLASS OF WATER ON AN EMPTY STOMACH DO NOT LIE DOWN FOR AT LEAST 30 MIN   amLODipine (NORVASC) 5 MG tablet Take 1 tablet (5 mg total) by mouth daily.   bimatoprost (LUMIGAN) 0.03 % ophthalmic solution    bisoprolol-hydrochlorothiazide (ZIAC) 2.5-6.25 MG tablet TAKE ONE TABLET EVERY MORNING   hydrOXYzine (ATARAX/VISTARIL) 10 MG tablet TAKE 1 TABLET BY MOUTH ONCE TO TWICE DAILY AT NIGHT AS NEEDED FOR ITCHING   mupirocin ointment (BACTROBAN) 2 % Apply topically daily.   mupirocin ointment (BACTROBAN) 2 % Apply to skin qd-bid   [EXPIRED] pneumococcal 20-Val Conj Vacc (PREVNAR 20) 0.5 ML injection Inject 0.5 mLs into the muscle once for 1 dose.   simvastatin (ZOCOR) 20 MG tablet Take 1 tablet (20 mg total) by mouth at bedtime.   triamcinolone (KENALOG)  0.1 % APPLY ONCE DAILY AS NEEDED   Zoster Vaccine Adjuvanted [facility]) injection Inject 0.5 mLs into the muscle once.   [DISCONTINUED] Tdap (BOOSTRIX) 5-2.5-18.5 LF-MCG/0.5 injection Inject 0.5 mLs into the muscle once.   [EXPIRED] Tdap (BOOSTRIX) 5-2.5-18.5 LF-MCG/0.5 injection Inject 0.5 mLs into the muscle once for 1 dose.   [DISCONTINUED] azithromycin (ZITHROMAX) 250 MG tablet Take one tab a day for 10 days for uri (Patient not taking: Reported on 04/26/2021)   [DISCONTINUED] hydrocortisone 2.5 % cream APPLY TO AFFECTED AREAS ON THE SKIN EVERYDAY FOR UP TO 5 DAYS PER WEEK (Patient not taking: Reported on 04/26/2021)   [DISCONTINUED] predniSONE (DELTASONE) 10 MG tablet Take one tab 3 x day for 3 days, then take one tab 2 x a day for 3 days and then take one tab a day for 3 days for copd (Patient not taking: Reported on 04/26/2021)   No facility-administered encounter medications on file as of 04/26/2021.    Surgical History: Past Surgical History:  Procedure Laterality Date   BREAST EXCISIONAL BIOPSY Left 1972   neg   BREAST EXCISIONAL BIOPSY Right 1980   neg   COLONOSCOPY     ESOPHAGOGASTRODUODENOSCOPY (EGD) WITH PROPOFOL N/A 04/09/2015   Procedure: ESOPHAGOGASTRODUODENOSCOPY (EGD) WITH PROPOFOL;  Surgeon: Manya Silvas, MD;  Location: [facility];  Service: Endoscopy;  Laterality: N/A;   skin cancer removal      Medical History: Past Medical History:  Diagnosis Date   Actinic keratosis  Anemia    Basal cell carcinoma 12/23/2007   Upper back.   Basal cell carcinoma 05/17/2009   Right med lower leg   Basal cell carcinoma 07/06/2014   Left medial breast   Basal cell carcinoma 03/01/2015   Right medial mid pretibial   Basal cell carcinoma 07/16/2015   Right superior breast   Basal cell carcinoma 05/21/2016   Left inferior medial breast   Basal cell carcinoma 02/08/2018   Left medial breast   Basal cell carcinoma 11/01/2018   Left medial breast   Basal cell carcinoma  03/30/2019   Left med inf breast   Basal cell carcinoma 09/05/2019   Left proximal bicep   Basal cell carcinoma 12/08/2019   Right forehead. Nodular pattern.   Basal cell carcinoma 12/13/2019   Left med. breast inf. Nodular and infiltrative patterns. EDC   Basal cell carcinoma 12/13/2019   Left med. breast. sup. Nodular pattern. EDC   Basal cell carcinoma 05/14/2020   Right upper back. BCC with focal sclerosis. EDC.   Basal cell carcinoma 12/25/2020   Left inf med breast EDC   Cancer ([place])    squamous cell   Glaucoma    Hyperlipidemia    Hypertension    Osteoporosis    Squamous cell carcinoma of skin 12/08/2007   Left lower leg. WD SCC   Squamous cell carcinoma of skin 02/01/2008   Left distal med. pretibial. WD SCC   Squamous cell carcinoma of skin 02/01/2008   Right mid pretibial. WD SCC   Squamous cell carcinoma of skin 02/01/2008   Right mid pretibal inferior. WD SCC with superficial infiltration.   Squamous cell carcinoma of skin 04/05/2008   Left dorsum hand. WD SCC with superficial infiltration.   Squamous cell carcinoma of skin 06/15/2008   Right lower leg inferior. WD SCC   Squamous cell carcinoma of skin 06/15/2008   Right lower leg superior. SCCis hypertrophic   Squamous cell carcinoma of skin 07/05/2008   Right lat. lower leg, Right lower leg, Left lower leg   Squamous cell carcinoma of skin 08/04/2008   Right ant. lower leg, Left med. lower leg   Squamous cell carcinoma of skin 09/01/2008   Right post. lower leg   Squamous cell carcinoma of skin 10/04/2008   Left post leg   Squamous cell carcinoma of skin 12/01/2008   Left lower leg   Squamous cell carcinoma of skin 03/08/2009   Right ant. mid lower leg, Right lower leg   Squamous cell carcinoma of skin 05/17/2009   Left lower post. leg superior   Squamous cell carcinoma of skin 10/11/2009   Left lower leg. KA-pattern   Squamous cell carcinoma of skin 03/28/2010   Right lower leg   Squamous cell  carcinoma of skin 03/25/2013   Right lower leg   Squamous cell carcinoma of skin 06/23/2013   Right post. lower leg. Right medial lower leg. Left medial lower leg   Squamous cell carcinoma of skin 09/09/2013   Left anterior upper arm. Right anterior lower leg. Right lat. lower leg. Right below knee lower leg   Squamous cell carcinoma of skin 12/08/2013   Right posterior lower leg sup. Right forearm   Squamous cell carcinoma of skin 03/03/2014   Right posterior leg. Right med. lower leg   Squamous cell carcinoma of skin 06/22/2014   Right to of shoulder anterior. Right top of shoulder posterior   Squamous cell carcinoma of skin 08/03/2014   Right anterior forearm   Squamous  cell carcinoma of skin 09/14/2014   Right chest   Squamous cell carcinoma of skin 10/30/2014   Right calf   Squamous cell carcinoma of skin 12/14/2014   Right med. distal pretibial sup. Right med distal pretibial inf. Left proximal bicep sup. Left proximal bicep inf.   Squamous cell carcinoma of skin 03/01/2015   Left lat proximal pretibial near knee   Squamous cell carcinoma of skin 03/29/2015   Left mid to prox. pretibial. Left mid. lat. pretibial.    Squamous cell carcinoma of skin 04/19/2015   Right mid calf. Right distal lat. pretibial   Squamous cell carcinoma of skin 05/31/2015   Right proximal med. pretibial ant. Right proximal med. pretibial posterior   Squamous cell carcinoma of skin 07/16/2015   Right upper arm inf. deltoid   Squamous cell carcinoma of skin 08/06/2015   Left distal pretibial   Squamous cell carcinoma of skin 09/12/2015   Left postauricular area   Squamous cell carcinoma of skin 12/12/2015   Left proximal pretibial x4 sites   Squamous cell carcinoma of skin 12/31/2015   Right med. lower leg above ankle sup. Right med. lower leg above ankle inf. Left inf. lat. calf med. Left inf. calf lat.   Squamous cell carcinoma of skin 01/31/2016   Right med. sup. ankle area. Right med mid  proximal calf. Left dorsum foot.    Squamous cell carcinoma of skin 03/17/2016   Left post. distal calf lateral. Left post distal calf med. Left mid lat ant. thigh. Right mid lat. calf   Squamous cell carcinoma of skin 05/21/2016   Right proximal medial pretibial. Right mid med. pretibial.   Squamous cell carcinoma of skin 07/03/2016   Right mid med. pretibial. Right proximal med. pretibial   Squamous cell carcinoma of skin 08/20/2016   Right medial calf. Left distal pretibial. Right upper back paraspinal   Squamous cell carcinoma of skin 10/30/2016   Right medial calf x2   Squamous cell carcinoma of skin 12/10/2016   left mid back 6.0cm lat to spine. Left sup. lat. calf. Left inf. post. calf   Squamous cell carcinoma of skin 12/25/2016   Right medial above ankle. Right mid pretibial. Right prox. pretibial. Right prox. pretibial med.    Squamous cell carcinoma of skin 01/19/2017   Left forearm. Left ant. neck   Squamous cell carcinoma of skin 03/23/2017   Left med. pretibial below knee   Squamous cell carcinoma of skin 04/13/2017   Right lat. sup. ankle. Right medial ankle   Squamous cell carcinoma of skin 04/27/2017   Left med. distal prox. calf. Left medial distal calf. Left lat. distal prox. calf. Left lat distal distal calf   Squamous cell carcinoma of skin 05/14/2017   Left bicep. Left dorsum mid forearm   Squamous cell carcinoma of skin 06/25/2017   Right med. distal calf x3   Squamous cell carcinoma of skin 09/02/2017   Left med. infraclavicular   Squamous cell carcinoma of skin 12/21/2017   Left prox. med. lower leg. Right forearm. Left forearm. Left bicep   Squamous cell carcinoma of skin 01/07/2018   Left dorsum foot prox. Left dorsum foot distal. Left mid to distal calf. Right chest   Squamous cell carcinoma of skin 03/08/2018   Left bicep. Right proximal dorsum forearm. Right distal med. pretibial sup. Right distal med pretibial inf.    Squamous cell carcinoma of skin  04/05/2018   Right sup. med scapula   Squamous cell carcinoma of skin  06/03/2018   Left med. ankle sup. Left med. ankle inf. Left lateral ankle.   Squamous cell carcinoma of skin 09/01/2018   Right mid lat. calf. Right lat. knee. Prox post calf   Squamous cell carcinoma of skin 11/01/2018   Left lateral ankle   Squamous cell carcinoma of skin 12/08/2018   Right lateral ankle lat malleolus   Squamous cell carcinoma of skin 01/05/2019   Right lateral elbow. Left mid medial calf. Left prox. ant. thigh. Right distal lat tricep   Squamous cell carcinoma of skin 01/19/2019   Right popliteal   Squamous cell carcinoma of skin 02/03/2019   Left mid lat calf. Left mid calf. Left dorsum lat distal foot. Left dorsum distal foot   Squamous cell carcinoma of skin 03/30/2019   Right lower leg above med ankle ant. Right lower leg above ankle sup. Right lower leg above the med ankle inf.   Squamous cell carcinoma of skin 05/12/2019   Right chest medial. Left deltoid   Squamous cell carcinoma of skin 06/23/2019   Right ant thigh distal above knee. Left med distal thigh. Left ant thigh above knee   Squamous cell carcinoma of skin 07/20/2019   Right chest. Right dorsal hand.   Squamous cell carcinoma of skin 08/03/2019   Left distal lat pretibial. Left distal med calf. Right prox. lat calf   Squamous cell carcinoma of skin 09/05/2019   Right middle bicep. Right sup lat bicep. Right sup mid bicep. Right sup med bicep   Squamous cell carcinoma of skin 09/15/2019   Right proximal med pretibial. Left distal lat thigh   Squamous cell carcinoma of skin 04/02/2020   Right calf, bleow right medial knee sup, below right medial knee post, below right medial knee inf   Squamous cell carcinoma of skin 05/14/2020   Right ant. shoulder. Mod. Diff. SCC. EDC   Squamous cell carcinoma of skin 06/27/2020   R mid to distal pretibial - ED&C   Squamous cell carcinoma of skin 06/27/2020   Below the R knee - ED&C     Squamous cell carcinoma of skin 06/27/2020   R prox pretibial    Squamous cell carcinoma of skin 06/27/2020   L distal med calf   Squamous cell carcinoma of skin 08/06/2020   Left ant deltoid   Squamous cell carcinoma of skin 12/25/2020   Left lat ankle EDC   Squamous cell carcinoma of skin 03/27/2021   left knee, EDC   Squamous cell skin cancer 03/27/2021   left lateral leg, EDC    Family History: Family History  Problem Relation Age of Onset   Breast cancer Sister 63   Atrial fibrillation Mother     Social History   Socioeconomic History   Marital status: Married    Spouse name: Not on file   Number of children: Not on file   Years of education: Not on file   Highest education level: Not on file  Occupational History   Not on file  Tobacco Use   Smoking status: Never   Smokeless tobacco: Never  Vaping Use   Vaping Use: Never used  Substance and Sexual Activity   Alcohol use: No   Drug use: No   Sexual activity: Not on file  Other Topics Concern   Not on file  Social History Narrative   Not on file   Social Determinants of Health   Financial Resource Strain: Not on file  Food Insecurity: Not on file  Transportation Needs:  Not on file  Physical Activity: Not on file  Stress: Not on file  Social Connections: Not on file  Intimate Partner Violence: Not on file      Review of Systems  Constitutional:  Negative for activity change, appetite change, chills, fatigue, fever and unexpected weight change.  HENT: Negative.  Negative for congestion, ear pain, rhinorrhea, sore throat and trouble swallowing.   Eyes: Negative.   Respiratory: Negative.  Negative for cough, chest tightness, shortness of breath and wheezing.   Cardiovascular: Negative.  Negative for chest pain.  Gastrointestinal: Negative.  Negative for abdominal pain, blood in stool, constipation, diarrhea, nausea and vomiting.  Endocrine: Negative.   Genitourinary: Negative.  Negative for difficulty  urinating, dysuria, frequency, hematuria and urgency.  Musculoskeletal: Negative.  Negative for arthralgias, back pain, joint swelling, myalgias and neck pain.  Skin: Negative.  Negative for rash and wound.  Allergic/Immunologic: Negative.  Negative for immunocompromised state.  Neurological: Negative.  Negative for dizziness, seizures, numbness and headaches.  Hematological: Negative.   Psychiatric/Behavioral: Negative.  Negative for behavioral problems, self-injury and suicidal ideas. The patient is not nervous/anxious.    Vital Signs: BP 120/72   Pulse 66   Temp 98.3 F (36.8 C)   Resp 16   Ht _0  (1.575 m)   Wt 107 lb 3.2 oz (48.6 kg)   SpO2 99%   BMI 19.61 kg/m    Physical Exam Constitutional:      General: She is awake. She is not in acute distress.    Appearance: Normal appearance. She is well-developed, well-groomed and normal weight. She is not ill-appearing or diaphoretic.  HENT:     Head: Normocephalic and atraumatic.     Right Ear: Tympanic membrane, ear canal and external ear normal.     Left Ear: Tympanic membrane, ear canal and external ear normal.     Nose: Nose normal. No congestion or rhinorrhea.     Mouth/Throat:     Lips: Pink.     Mouth: Mucous membranes are moist.     Pharynx: Oropharynx is clear. Uvula midline. No oropharyngeal exudate or posterior oropharyngeal erythema.  Eyes:     General: Lids are normal. Vision grossly intact. Gaze aligned appropriately. No scleral icterus.       Right eye: No discharge.        Left eye: No discharge.     Extraocular Movements: Extraocular movements intact.     Conjunctiva/sclera: Conjunctivae normal.     Pupils: Pupils are equal, round, and reactive to light.     Funduscopic exam:    Right eye: Red reflex present.        Left eye: Red reflex present. Neck:     Thyroid: No thyromegaly.     Vascular: No JVD.     Trachea: Trachea and phonation normal. No tracheal deviation.  Cardiovascular:     Rate and  Rhythm: Normal rate and regular rhythm.     Pulses: Normal pulses.     Heart sounds: Normal heart sounds, S1 normal and S2 normal. No murmur heard.   No friction rub. No gallop.  Pulmonary:     Effort: Pulmonary effort is normal. No accessory muscle usage or respiratory distress.     Breath sounds: Normal breath sounds. No stridor. No wheezing or rales.  Chest:     Chest wall: No tenderness.  Abdominal:     General: Bowel sounds are normal. There is no distension.     Palpations: Abdomen is soft. There  is no mass.     Tenderness: There is no abdominal tenderness. There is no guarding or rebound.  Musculoskeletal:        General: No tenderness or deformity. Normal range of motion.     Cervical back: Normal range of motion and neck supple.     Right lower leg: No edema.     Left lower leg: No edema.  Lymphadenopathy:     Cervical: No cervical adenopathy.  Skin:    General: Skin is warm and dry.     Capillary Refill: Capillary refill takes less than 2 seconds.     Coloration: Skin is not pale.     Findings: No erythema or rash.  Neurological:     Mental Status: She is alert and oriented to person, place, and time.     Cranial Nerves: No cranial nerve deficit.     Motor: No abnormal muscle tone.     Coordination: Coordination normal.     Gait: Gait normal.     Deep Tendon Reflexes: Reflexes are normal and symmetric.  Psychiatric:        Mood and Affect: Mood and affect normal.        Behavior: Behavior normal. Behavior is cooperative.        Thought Content: Thought content normal.        Judgment: Judgment normal.       Assessment/Plan: 1. Encounter for general adult medical examination with abnormal findings Age-appropriate preventive screenings and vaccinations discussed, annual physical exam completed. Routine labs for health maintenance ordered see below. PHM updated. Mammogram due.   2. Essential hypertension Stable, lab ordered - Lipid Profile  3. Chronic kidney  disease (CKD) stage G3b/A1, moderately decreased glomerular filtration rate (GFR) between 30-44 mL/min/1.73 square meter and albuminuria creatinine ratio less than 30 mg/g (HCC) Lab ordered.  - CMP14+EGFR  4. Senile osteoporosis Lab ordered, BMD done in 2020. Takes fosamax - Vitamin D (25 hydroxy)  5. Mixed hyperlipidemia Lab ordered, repeat lipid panel. Taking simvastatin.  - TSH + free T4 -Lipid profile  6. B12 deficiency Labs ordered - CBC with Differential/Platelet - B12 and Folate Panel - Iron, TIBC and Ferritin Panel  7. Dysuria Routine urinalysis done - UA/M w/rflx Culture, Routine - Microscopic Examination  8. Need for vaccination - Zoster Vaccine Adjuvanted [facility]) injection; Inject 0.5 mLs into the muscle once. - Tdap (BOOSTRIX) 5-2.5-18.5 LF-MCG/0.5 injection; Inject 0.5 mLs into the muscle once for 1 dose.  Dispense: 0.5 mL; Refill: 0 - pneumococcal 20-Val Conj Vacc (PREVNAR 20) 0.5 ML injection; Inject 0.5 mLs into the muscle once for 1 dose.  Dispense: 0.5 mL; Refill: 0    General Counseling: Babs verbalizes understanding of the findings of todays visit and agrees with plan of treatment. I have discussed any further diagnostic evaluation that may be needed or ordered today. We also reviewed her medications today. she has been encouraged to call the office with any questions or concerns that should arise related to todays visit.    Orders Placed This Encounter  Procedures   Microscopic Examination   UA/M w/rflx Culture, Routine   CBC with Differential/Platelet   CMP14+EGFR   Lipid Profile   TSH + free T4   Vitamin D (25 hydroxy)   B12 and Folate Panel   Iron, TIBC and Ferritin Panel    Meds ordered this encounter  Medications   Tdap (BOOSTRIX) 5-2.5-18.5 LF-MCG/0.5 injection    Sig: Inject 0.5 mLs into the muscle once for 1  dose.    Dispense:  0.5 mL    Refill:  0   pneumococcal 20-Val Conj Vacc (PREVNAR 20) 0.5 ML injection    Sig: Inject 0.5  mLs into the muscle once for 1 dose.    Dispense:  0.5 mL    Refill:  0    Return in about 1 year (around 04/26/2022) for CPE, Ayat Drenning PCP.   Total time spent:30 Minutes Time spent includes review of chart, medications, test results, and follow up plan with the patient.   [place] Controlled Substance Database was reviewed by me.  This patient was seen by Jonetta Osgood, FNP-C in collaboration with Dr. [facility] Bigness as a part of collaborative care agreement.  Vandora Jaskulski R. Valetta Fuller, MSN, FNP-C Internal medicine

## 2021-04-27 LAB — MICROSCOPIC EXAMINATION
Bacteria, UA: NONE SEEN
Casts: NONE SEEN /lpf
Epithelial Cells (non renal): NONE SEEN /hpf (ref 0–10)
RBC, Urine: NONE SEEN /hpf (ref 0–2)
WBC, UA: NONE SEEN /hpf (ref 0–5)

## 2021-04-27 LAB — CBC WITH DIFFERENTIAL/PLATELET
Basophils Absolute: 0.1 10*3/uL (ref 0.0–0.2)
Basos: 1 %
EOS (ABSOLUTE): 0.2 10*3/uL (ref 0.0–0.4)
Eos: 2 %
Hematocrit: 38 % (ref 34.0–46.6)
Hemoglobin: 12.2 g/dL (ref 11.1–15.9)
Immature Grans (Abs): 0.1 10*3/uL (ref 0.0–0.1)
Immature Granulocytes: 1 %
Lymphocytes Absolute: 1.7 10*3/uL (ref 0.7–3.1)
Lymphs: 20 %
MCH: 28.3 pg (ref 26.6–33.0)
MCHC: 32.1 g/dL (ref 31.5–35.7)
MCV: 88 fL (ref 79–97)
Monocytes Absolute: 0.5 10*3/uL (ref 0.1–0.9)
Monocytes: 6 %
Neutrophils Absolute: 5.9 10*3/uL (ref 1.4–7.0)
Neutrophils: 70 %
Platelets: 234 10*3/uL (ref 150–450)
RBC: 4.31 x10E6/uL (ref 3.77–5.28)
RDW: 13.3 % (ref 11.7–15.4)
WBC: 8.3 10*3/uL (ref 3.4–10.8)

## 2021-04-27 LAB — CMP14+EGFR
ALT: 6 IU/L (ref 0–32)
AST: 19 IU/L (ref 0–40)
Albumin/Globulin Ratio: 2.1 (ref 1.2–2.2)
Albumin: 4.6 g/dL (ref 3.7–4.7)
Alkaline Phosphatase: 55 IU/L (ref 44–121)
BUN/Creatinine Ratio: 14 (ref 12–28)
BUN: 18 mg/dL (ref 8–27)
Bilirubin Total: 0.5 mg/dL (ref 0.0–1.2)
CO2: 27 mmol/L (ref 20–29)
Calcium: 9.5 mg/dL (ref 8.7–10.3)
Chloride: 99 mmol/L (ref 96–106)
Creatinine, Ser: 1.26 mg/dL — ABNORMAL HIGH (ref 0.57–1.00)
Globulin, Total: 2.2 g/dL (ref 1.5–4.5)
Glucose: 95 mg/dL (ref 65–99)
Potassium: 4.6 mmol/L (ref 3.5–5.2)
Sodium: 141 mmol/L (ref 134–144)
Total Protein: 6.8 g/dL (ref 6.0–8.5)
eGFR: 45 mL/min/{1.73_m2} — ABNORMAL LOW (ref >59)

## 2021-04-27 LAB — IRON,TIBC AND FERRITIN PANEL
Ferritin: 166 ng/mL — ABNORMAL HIGH (ref 15–150)
Iron Saturation: 27 % (ref 15–55)
Iron: 68 ug/dL (ref 27–139)
Total Iron Binding Capacity: 253 ug/dL (ref 250–450)
UIBC: 185 ug/dL (ref 118–369)

## 2021-04-27 LAB — LIPID PANEL
Chol/HDL Ratio: 2.9 ratio (ref 0.0–4.4)
Cholesterol, Total: 191 mg/dL (ref 100–199)
HDL: 67 mg/dL (ref >39)
LDL Chol Calc (NIH): 109 mg/dL — ABNORMAL HIGH (ref 0–99)
Triglycerides: 85 mg/dL (ref 0–149)
VLDL Cholesterol Cal: 15 mg/dL (ref 5–40)

## 2021-04-27 LAB — TSH+FREE T4
Free T4: 1.21 ng/dL (ref 0.82–1.77)
TSH: 3.81 u[IU]/mL (ref 0.450–4.500)

## 2021-04-27 LAB — UA/M W/RFLX CULTURE, ROUTINE
Bilirubin, UA: NEGATIVE
Glucose, UA: NEGATIVE
Ketones, UA: NEGATIVE
Leukocytes,UA: NEGATIVE
Nitrite, UA: NEGATIVE
Protein,UA: NEGATIVE
RBC, UA: NEGATIVE
Specific Gravity, UA: 1.014 (ref 1.005–1.030)
Urobilinogen, Ur: 0.2 mg/dL (ref 0.2–1.0)
pH, UA: 6.5 (ref 5.0–7.5)

## 2021-04-27 LAB — B12 AND FOLATE PANEL
Folate: 4.9 ng/mL (ref >3.0)
Vitamin B-12: 961 pg/mL (ref 232–1245)

## 2021-04-27 LAB — VITAMIN D 25 HYDROXY (VIT D DEFICIENCY, FRACTURES): Vit D, 25-Hydroxy: 60.6 ng/mL (ref 30.0–100.0)

## 2021-04-30 ENCOUNTER — Ambulatory Visit
Admission: RE | Admit: 2021-04-30 | Discharge: 2021-04-30 | Disposition: A | Discharge status: Home / Self Care | Payer: PPO | Source: Ambulatory Visit | Attending: Internal Medicine | Admitting: Internal Medicine

## 2021-04-30 ENCOUNTER — Other Ambulatory Visit: Payer: Self-pay

## 2021-04-30 DIAGNOSIS — Z1231 Encounter for screening mammogram for malignant neoplasm of breast: Secondary | ICD-10-CM | POA: Diagnosis not present

## 2021-05-13 ENCOUNTER — Ambulatory Visit: Payer: PPO

## 2021-05-14 ENCOUNTER — Ambulatory Visit (INDEPENDENT_AMBULATORY_CARE_PROVIDER_SITE_OTHER): Payer: PPO

## 2021-05-14 ENCOUNTER — Other Ambulatory Visit: Payer: Self-pay

## 2021-05-14 DIAGNOSIS — E538 Deficiency of other specified B group vitamins: Secondary | ICD-10-CM

## 2021-05-14 MED ORDER — CYANOCOBALAMIN 1000 MCG/ML IJ SOLN
1000.0000 ug | Freq: Once | INTRAMUSCULAR | Status: AC
  Administered 2021-05-14: 1000 ug via INTRAMUSCULAR

## 2021-06-13 ENCOUNTER — Other Ambulatory Visit: Payer: Self-pay

## 2021-06-13 ENCOUNTER — Ambulatory Visit (INDEPENDENT_AMBULATORY_CARE_PROVIDER_SITE_OTHER): Payer: PPO

## 2021-06-13 DIAGNOSIS — E538 Deficiency of other specified B group vitamins: Secondary | ICD-10-CM | POA: Diagnosis not present

## 2021-06-13 MED ORDER — CYANOCOBALAMIN 1000 MCG/ML IJ SOLN
1000.0000 ug | Freq: Once | INTRAMUSCULAR | Status: AC
  Administered 2021-06-13: 1000 ug via INTRAMUSCULAR

## 2021-06-26 IMAGING — MG DIGITAL SCREENING BILAT W/ TOMO W/ CAD
8 series · 9 of 24 positions shown · non-contrast
Comparison: Previous exam(s).

CLINICAL DATA: Screening.

EXAM:
DIGITAL SCREENING BILATERAL MAMMOGRAM WITH TOMO AND CAD

[L CC synth-2D]
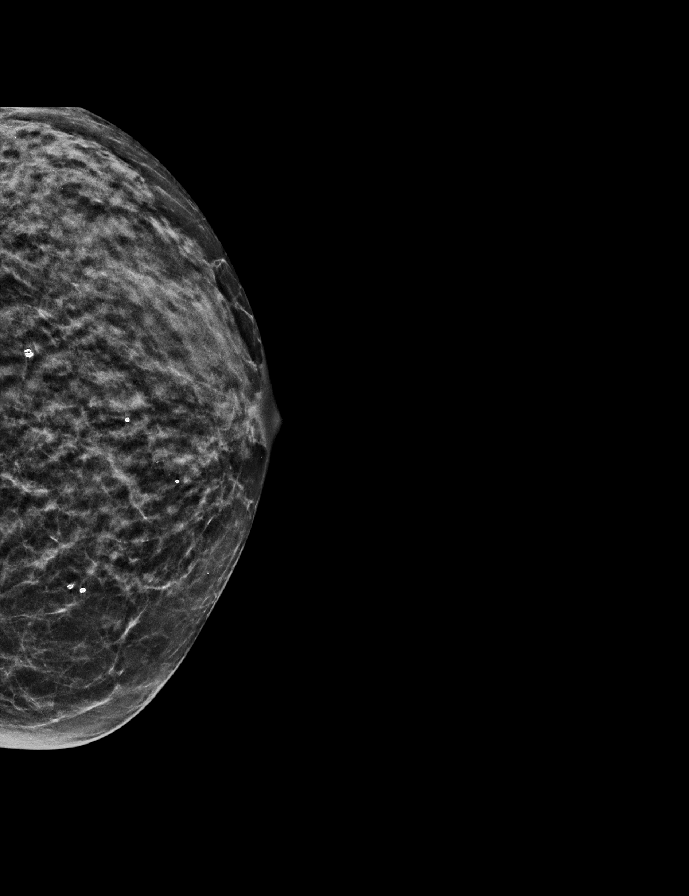

[R CC synth-2D]
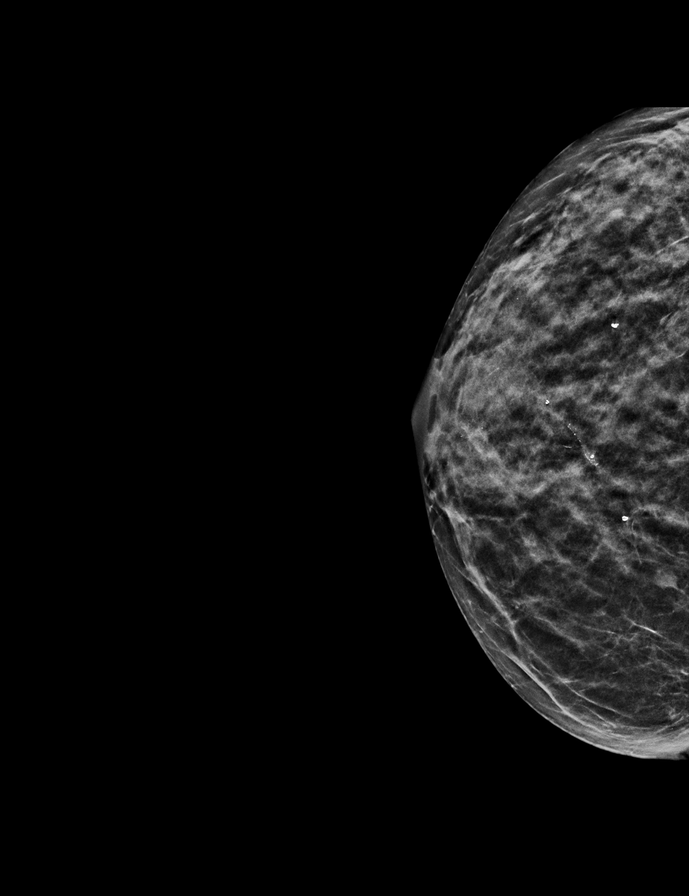

[L MLO synth-2D]
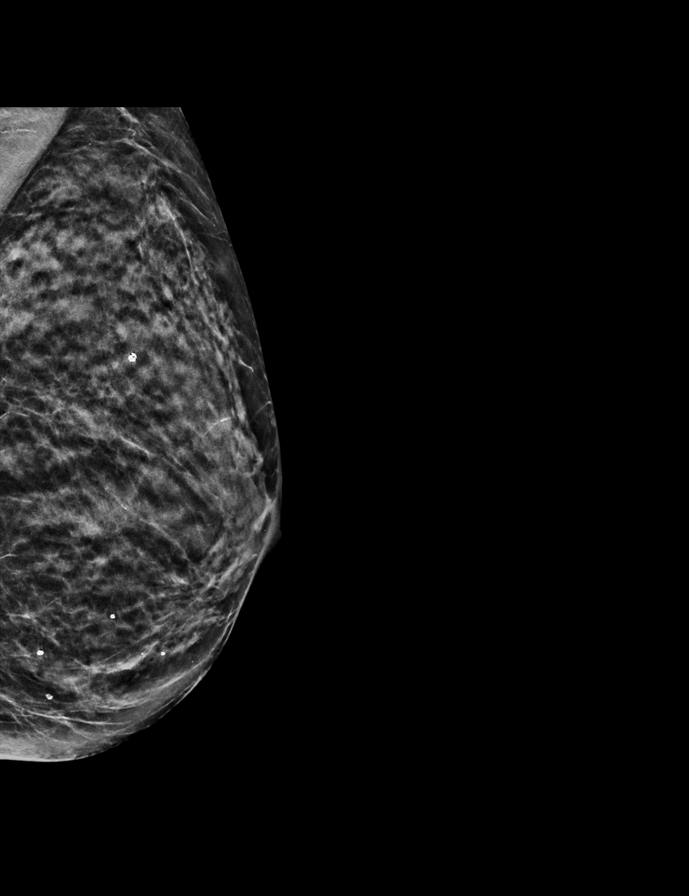

[R MLO synth-2D]
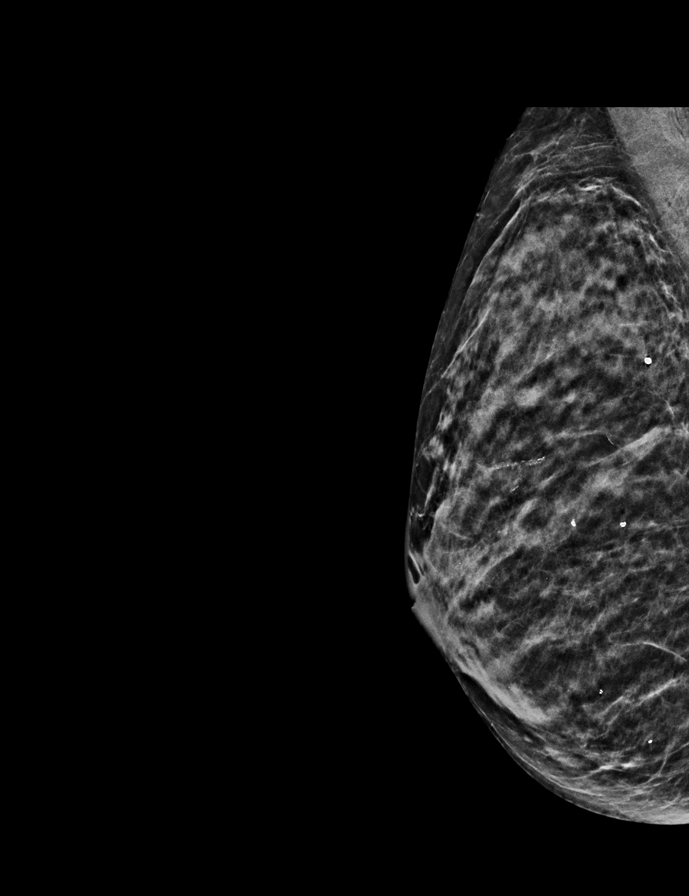

[R CC tomo · 2 of 41 frames shown]
[frame 14/41]
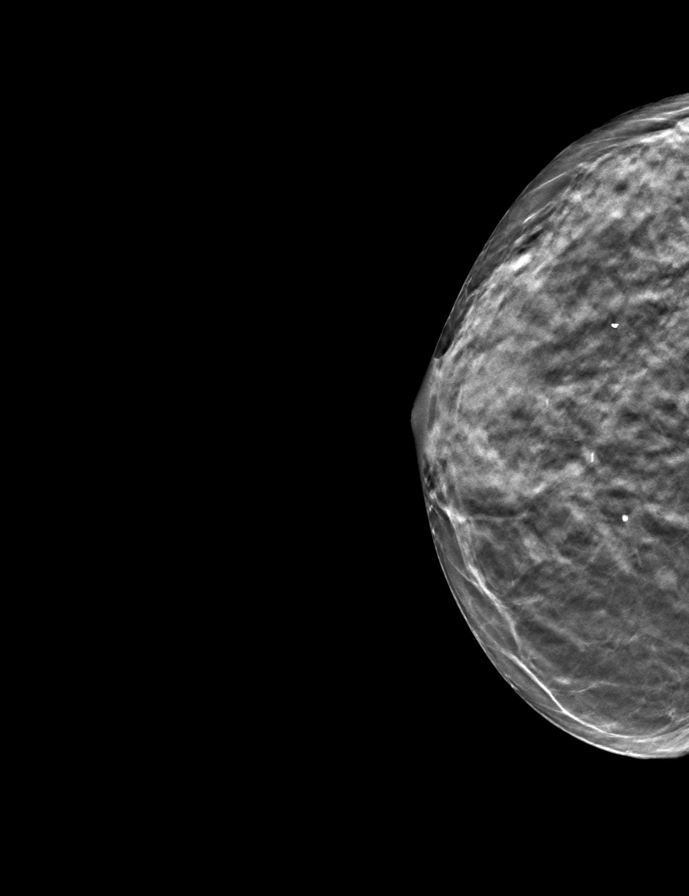
[frame 21/41]
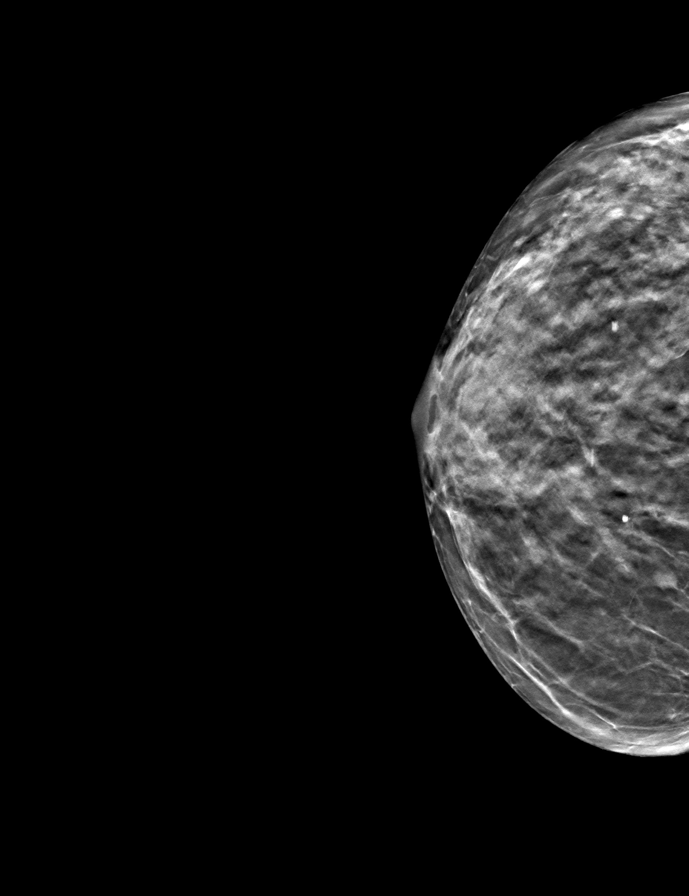

[R MLO tomo · tomo slice 21/41.0]
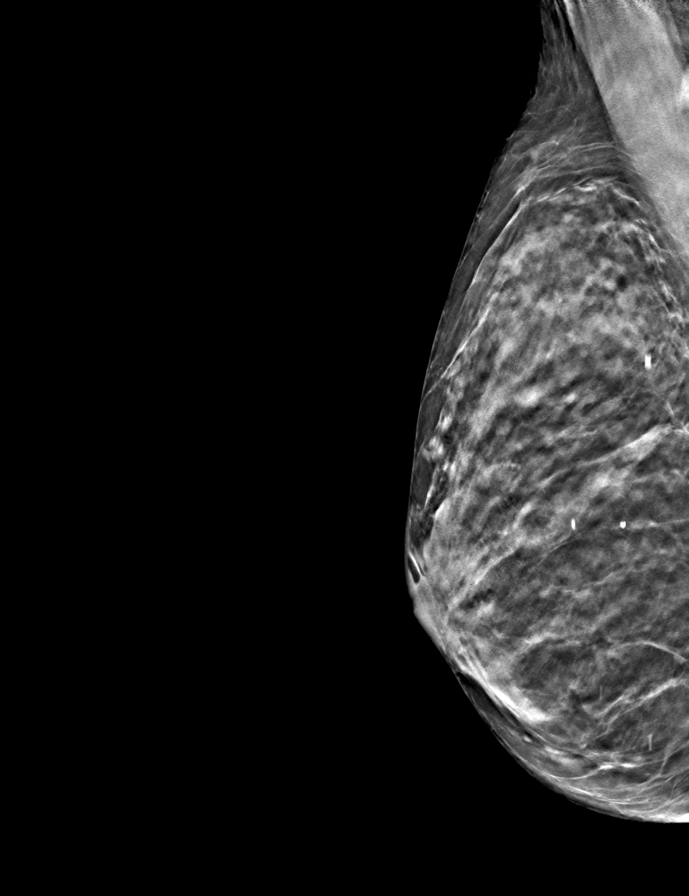

[L CC tomo · tomo slice 21/40.0]
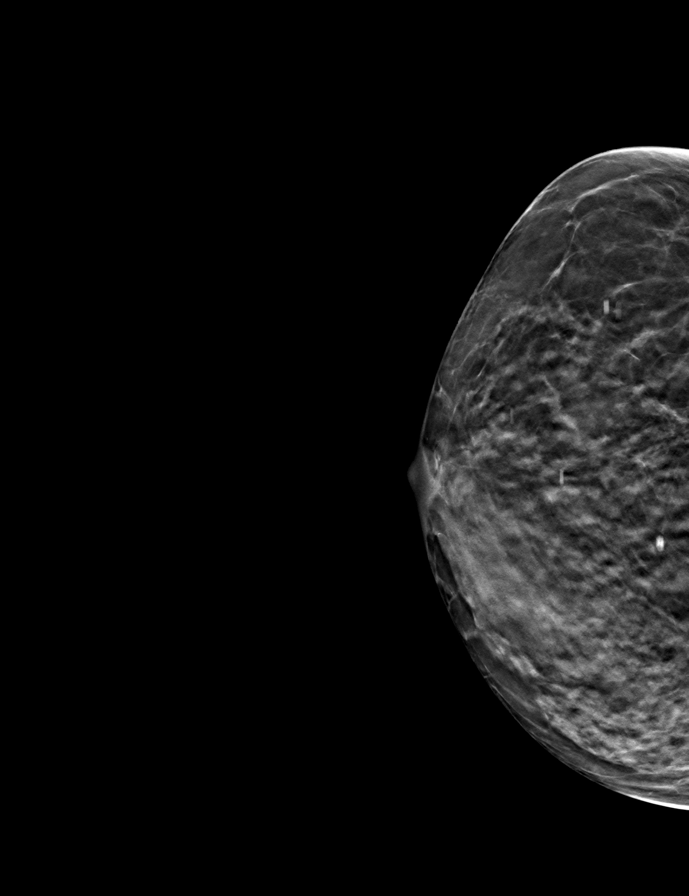

[L MLO tomo · tomo slice 21/42.0]
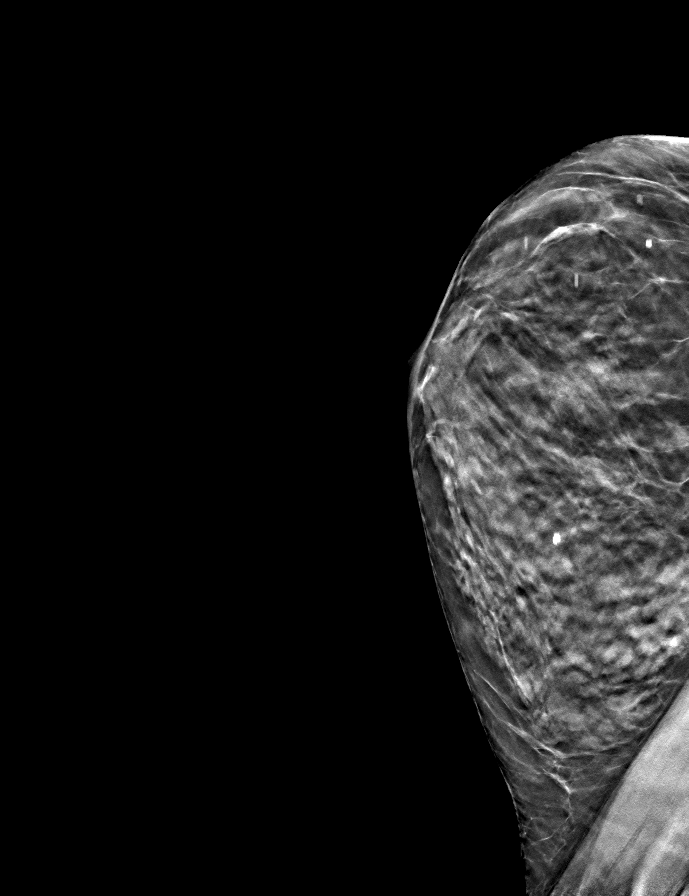

[9 of 24 positions shown; findings below may reference images not displayed]

ACR Breast Density Category c: The breast tissue is heterogeneously
dense, which may obscure small masses.
FINDINGS: There are no findings suspicious for malignancy. Images were
processed with CAD.
IMPRESSION: No mammographic evidence of malignancy. A result letter of this
screening mammogram will be mailed directly to the patient.

RECOMMENDATION:
Screening mammogram in one year. (Code:FT-U-LHB)

BI-RADS CATEGORY  1: Negative.

## 2021-06-28 ENCOUNTER — Other Ambulatory Visit: Payer: Self-pay | Admitting: Internal Medicine

## 2021-07-03 ENCOUNTER — Ambulatory Visit: Payer: PPO | Admitting: Dermatology

## 2021-07-03 ENCOUNTER — Other Ambulatory Visit: Payer: Self-pay

## 2021-07-03 DIAGNOSIS — C44612 Basal cell carcinoma of skin of right upper limb, including shoulder: Secondary | ICD-10-CM | POA: Diagnosis not present

## 2021-07-03 DIAGNOSIS — C4442 Squamous cell carcinoma of skin of scalp and neck: Secondary | ICD-10-CM | POA: Diagnosis not present

## 2021-07-03 DIAGNOSIS — D492 Neoplasm of unspecified behavior of bone, soft tissue, and skin: Secondary | ICD-10-CM

## 2021-07-03 DIAGNOSIS — L578 Other skin changes due to chronic exposure to nonionizing radiation: Secondary | ICD-10-CM | POA: Diagnosis not present

## 2021-07-03 DIAGNOSIS — Z85828 Personal history of other malignant neoplasm of skin: Secondary | ICD-10-CM | POA: Diagnosis not present

## 2021-07-03 DIAGNOSIS — C44722 Squamous cell carcinoma of skin of right lower limb, including hip: Secondary | ICD-10-CM

## 2021-07-03 NOTE — Progress Notes (Signed)
Follow-Up Visit   Subjective  Joann Warren is a 75 y.o. female who presents for the following: check spots (L post neck, R upper arm, R ant thigh, new areas).  The following portions of the chart were reviewed this encounter and updated as appropriate:   Tobacco  Allergies  Meds  Problems  Med Hx  Surg Hx  Fam Hx     Review of Systems:  No other skin or systemic complaints except as noted in HPI or Assessment and Plan.  Objective  Well appearing patient in no apparent distress; mood and affect are within normal limits.  A focused examination was performed including neck, R upper arm, bil legs. Relevant physical exam findings are noted in the Assessment and Plan.  L lat neck 0.8cm hyperkeratotic pap     R bicep 1.1cm crusted pap     R ant thigh 1.2cm hyperkeratotic pap     R proximal lat pretibial 1.2cm hyperkeratotic pap      Assessment & Plan  Neoplasm of skin (4) L lat neck Epidermal / dermal shaving Lesion diameter (cm):  0.8 Informed consent: discussed and consent obtained   Timeout: patient name, date of birth, surgical site, and procedure verified   Procedure prep:  Patient was prepped and draped in usual sterile fashion Prep type:  Isopropyl alcohol Anesthesia: the lesion was anesthetized in a standard fashion   Anesthetic:  1% lidocaine w/ epinephrine 1-100,000 buffered w/ 8.4% NaHCO3 Instrument used: flexible razor blade   Hemostasis achieved with: pressure, aluminum chloride and electrodesiccation   Outcome: patient tolerated procedure well   Post-procedure details: sterile dressing applied and wound care instructions given   Dressing type: bandage and bacitracin    Destruction of lesion Complexity: extensive   Destruction method: electrodesiccation and curettage   Informed consent: discussed and consent obtained   Timeout:  patient name, date of birth, surgical site, and procedure verified Procedure prep:  Patient was prepped and  draped in usual sterile fashion Prep type:  Isopropyl alcohol Anesthesia: the lesion was anesthetized in a standard fashion   Anesthetic:  1% lidocaine w/ epinephrine 1-100,000 buffered w/ 8.4% NaHCO3 Curettage performed in three different directions: Yes   Electrodesiccation performed over the curetted area: Yes   Lesion length (cm):  0.8 Lesion width (cm):  0.8 Margin per side (cm):  0.2 Final wound size (cm):  1.2 Hemostasis achieved with:  pressure, aluminum chloride and electrodesiccation Outcome: patient tolerated procedure well with no complications   Post-procedure details: sterile dressing applied and wound care instructions given   Dressing type: bandage and petrolatum    Specimen 1 - Surgical pathology Differential Diagnosis: D48.5 R/O SCC Check Margins: No 0.8cm hyperkeratotic pap EDC today  R bicep Epidermal / dermal shaving Lesion diameter (cm):  1.1 Informed consent: discussed and consent obtained   Timeout: patient name, date of birth, surgical site, and procedure verified   Procedure prep:  Patient was prepped and draped in usual sterile fashion Prep type:  Isopropyl alcohol Anesthesia: the lesion was anesthetized in a standard fashion   Anesthetic:  1% lidocaine w/ epinephrine 1-100,000 buffered w/ 8.4% NaHCO3 Instrument used: flexible razor blade   Hemostasis achieved with: pressure, aluminum chloride and electrodesiccation   Outcome: patient tolerated procedure well   Post-procedure details: sterile dressing applied and wound care instructions given   Dressing type: bandage and petrolatum    Destruction of lesion Complexity: extensive   Destruction method: electrodesiccation and curettage   Informed consent: discussed  and consent obtained   Timeout:  patient name, date of birth, surgical site, and procedure verified Procedure prep:  Patient was prepped and draped in usual sterile fashion Prep type:  Isopropyl alcohol Anesthesia: the lesion was  anesthetized in a standard fashion   Anesthetic:  1% lidocaine w/ epinephrine 1-100,000 buffered w/ 8.4% NaHCO3 Curettage performed in three different directions: Yes   Electrodesiccation performed over the curetted area: Yes   Lesion length (cm):  1.1 Lesion width (cm):  1.1 Margin per side (cm):  0.2 Final wound size (cm):  1.5 Hemostasis achieved with:  pressure, aluminum chloride and electrodesiccation Outcome: patient tolerated procedure well with no complications   Post-procedure details: sterile dressing applied and wound care instructions given   Dressing type: bandage and petrolatum    Specimen 2 - Surgical pathology Differential Diagnosis: D48.5 R/O BCC Check Margins: No 1.1cm crusted pap EDC today  R ant thigh Epidermal / dermal shaving Lesion diameter (cm):  1.2 Informed consent: discussed and consent obtained   Timeout: patient name, date of birth, surgical site, and procedure verified   Procedure prep:  Patient was prepped and draped in usual sterile fashion Prep type:  Isopropyl alcohol Anesthesia: the lesion was anesthetized in a standard fashion   Anesthetic:  1% lidocaine w/ epinephrine 1-100,000 buffered w/ 8.4% NaHCO3 Instrument used: flexible razor blade   Hemostasis achieved with: pressure, aluminum chloride and electrodesiccation   Outcome: patient tolerated procedure well   Post-procedure details: sterile dressing applied and wound care instructions given   Dressing type: bandage and bacitracin    Destruction of lesion Complexity: extensive   Destruction method: electrodesiccation and curettage   Informed consent: discussed and consent obtained   Timeout:  patient name, date of birth, surgical site, and procedure verified Procedure prep:  Patient was prepped and draped in usual sterile fashion Prep type:  Isopropyl alcohol Anesthesia: the lesion was anesthetized in a standard fashion   Anesthetic:  1% lidocaine w/ epinephrine 1-100,000 buffered w/  8.4% NaHCO3 Curettage performed in three different directions: Yes   Electrodesiccation performed over the curetted area: Yes   Lesion length (cm):  1.2 Lesion width (cm):  1.2 Margin per side (cm):  0.2 Final wound size (cm):  1.6 Hemostasis achieved with:  pressure, aluminum chloride and electrodesiccation Outcome: patient tolerated procedure well with no complications   Post-procedure details: sterile dressing applied and wound care instructions given   Dressing type: bandage and bacitracin    Specimen 3 - Surgical pathology Differential Diagnosis: D48.5 R/O SCC Check Margins: No 1.2cm hyperkeratotic pap EDC today  R proximal lat pretibial Epidermal / dermal shaving Lesion diameter (cm):  1.2 Informed consent: discussed and consent obtained   Timeout: patient name, date of birth, surgical site, and procedure verified   Procedure prep:  Patient was prepped and draped in usual sterile fashion Prep type:  Isopropyl alcohol Anesthesia: the lesion was anesthetized in a standard fashion   Anesthetic:  1% lidocaine w/ epinephrine 1-100,000 buffered w/ 8.4% NaHCO3 Instrument used: flexible razor blade   Hemostasis achieved with: pressure, aluminum chloride and electrodesiccation   Outcome: patient tolerated procedure well   Post-procedure details: sterile dressing applied and wound care instructions given   Dressing type: bandage and bacitracin    Destruction of lesion Complexity: extensive   Destruction method: electrodesiccation and curettage   Informed consent: discussed and consent obtained   Timeout:  patient name, date of birth, surgical site, and procedure verified Procedure prep:  Patient was prepped  and draped in usual sterile fashion Prep type:  Isopropyl alcohol Anesthesia: the lesion was anesthetized in a standard fashion   Anesthetic:  1% lidocaine w/ epinephrine 1-100,000 buffered w/ 8.4% NaHCO3 Curettage performed in three different directions: Yes    Electrodesiccation performed over the curetted area: Yes   Lesion length (cm):  1.2 Lesion width (cm):  1.2 Margin per side (cm):  0.2 Final wound size (cm):  1.6 Hemostasis achieved with:  pressure, aluminum chloride and electrodesiccation Outcome: patient tolerated procedure well with no complications   Post-procedure details: sterile dressing applied and wound care instructions given   Dressing type: bandage and bacitracin    Specimen 4 - Surgical pathology Differential Diagnosis: D48.5 R/O SCC Check Margins: No 1.2cm hyperkeratotic pap EDC today Related Medications mupirocin ointment (BACTROBAN) 2 % Apply to skin qd-bid  Actinic Damage - Severe, confluent actinic changes with pre-cancerous actinic keratoses  - Severe, chronic, not at goal, secondary to cumulative UV radiation exposure over time - diffuse scaly erythematous macules and papules with underlying dyspigmentation - Discussed Prescription "Field Treatment" for Severe, Chronic Confluent Actinic Changes with Pre-Cancerous Actinic Keratoses Field treatment involves treatment of an entire area of skin that has confluent Actinic Changes (Sun/ Ultraviolet light damage) and PreCancerous Actinic Keratoses by method of PhotoDynamic Therapy (PDT) and/or prescription Topical Chemotherapy agents such as 5-fluorouracil, 5-fluorouracil/calcipotriene, and/or imiquimod.  The purpose is to decrease the number of clinically evident and subclinical PreCancerous lesions to prevent progression to development of skin cancer by chemically destroying early precancer changes that may or may not be visible.  It has been shown to reduce the risk of developing skin cancer in the treated area. As a result of treatment, redness, scaling, crusting, and open sores may occur during treatment course. One or more than one of these methods may be used and may have to be used several times to control, suppress and eliminate the PreCancerous changes. Discussed  treatment course, expected reaction, and possible side effects. - Recommend daily broad spectrum sunscreen SPF 30+ to sun-exposed areas, reapply every 2 hours as needed.  - Staying in the shade or wearing long sleeves, sun glasses (UVA+UVB protection) and wide brim hats (4-inch brim around the entire circumference of the hat) are also recommended. - Call for new or changing lesions.  - Start 5-fluorouracil/calcipotriene cream twice a day for 7 days to affected areas including bil lower legs from knees to ankles. Prescription sent to [facility]. Patient advised they will receive an email to purchase the medication online and have it sent to their home. Patient provided with handout reviewing treatment course and side effects and advised to call or message [place] on MyChart with any concerns.    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  These are surely sun damage related but may have HPV component too.  We have done "field treatments" in past with Photodynamic therapy with Levulan as well as "5-FU Chemowraps".   Because of the persistent issue, I sent patient for evaluation to [facility] to see if she may be a candidate for CAPECITABINE oral (5-FU) chemotherapy - or other agent that patient may benefit  from.  She saw Dr Tasia Catchings on 04/10/20 who did not recommend any systemic chemotherapy such as Capecitabine, but advise if she had cancers that were refractory or metastatic, she could be a candidate for immunotherapy such as Cemiplimab. She discussed oral retinoids and oral nicotinamide options. She also discussed possible screening for immunocompromised  state with testing for HIV; hepatitis and possible genetic counseling for genetic syndromes predisposing to SCC formation.   - No evidence of recurrence today - No lymphadenopathy - Recommend regular full body skin exams - Recommend daily broad spectrum sunscreen SPF 30+ to sun-exposed areas, reapply every 2 hours as needed.  - Call if any new or changing lesions are noted between office visits  Return in about 3 months (around 10/03/2021) for hx of SCC f/u.  I, Othelia Pulling, RMA, am acting as scribe for Sarina Ser, MD . Documentation: I have reviewed the above documentation for accuracy and completeness, and I agree with the above.  Sarina Ser, MD

## 2021-07-03 NOTE — Patient Instructions (Addendum)
If You Need Anything After Your Visit  If you have any questions or concerns for your doctor, please call our main line at [phone] and press option 4 to reach your doctor's medical assistant. If no one answers, please leave a voicemail as directed and we will return your call as soon as possible. Messages left after 4 pm will be answered the following business day.   You may also send us a message via MyChart. We typically respond to MyChart messages within 1-2 business days.  For prescription refills, please ask your pharmacy to contact our office. Our fax number is [phone].  If you have an urgent issue when the clinic is closed that cannot wait until the next business day, you can page your doctor at the number below.    Please note that while we do our best to be available for urgent issues outside of office hours, we are not available 24/7.   If you have an urgent issue and are unable to reach us, you may choose to seek medical care at your doctor's office, retail clinic, urgent care center, or emergency room.  If you have a medical emergency, please immediately call 911 or go to the emergency department.  Pager Numbers  - Dr. Kowalski: [phone]  - Dr. Moye: [phone]  - Dr. Stewart: [phone]  In the event of inclement weather, please call our main line at [phone] for an update on the status of any delays or closures.  Dermatology Medication Tips: Please keep the boxes that topical medications come in in order to help keep track of the instructions about where and how to use these. Pharmacies typically print the medication instructions only on the boxes and not directly on the medication tubes.   If your medication is too expensive, please contact our office at [phone] option 4 or send us a message through MyChart.   We are unable to tell what your co-pay for medications will be in advance as this is different depending on your insurance coverage.  However, we may be able to find a substitute medication at lower cost or fill out paperwork to get insurance to cover a needed medication.   If a prior authorization is required to get your medication covered by your insurance company, please allow us 1-2 business days to complete this process.  Drug prices often vary depending on where the prescription is filled and some pharmacies may offer cheaper prices.  The website www.goodrx.com contains coupons for medications through different pharmacies. The prices here do not account for what the cost may be with help from insurance (it may be cheaper with your insurance), but the website can give you the price if you did not use any insurance.  - You can print the associated coupon and take it with your prescription to the pharmacy.  - You may also stop by our office during regular business hours and pick up a GoodRx coupon card.  - If you need your prescription sent electronically to a different pharmacy, notify our office through [place] MyChart or by phone at [phone] option 4.     Si Usted Necesita Algo Despus de Su Visita  Tambin puede enviarnos un mensaje a travs de MyChart. Por lo general respondemos a los mensajes de MyChart en el transcurso de 1 a 2 das hbiles.  Para renovar recetas, por favor pida a su farmacia que se ponga en contacto con nuestra oficina. Nuestro nmero de fax es el [phone].  Si tiene   un asunto urgente cuando la clnica est cerrada y que no puede esperar hasta el siguiente da hbil, puede llamar/localizar a su doctor(a) al nmero que aparece a continuacin.   Por favor, tenga en cuenta que aunque hacemos todo lo posible para estar disponibles para asuntos urgentes fuera del horario de [place], no estamos disponibles las 24 horas del da, los 7 das de la [place].   Si tiene un problema urgente y no puede comunicarse con nosotros, puede optar por buscar atencin mdica  en el consultorio de su  doctor(a), en una clnica privada, en un centro de atencin urgente o en una sala de emergencias.  Si tiene Engineering geologist, por favor llame inmediatamente al 911 o vaya a la sala de emergencias.  Nmeros de bper  - Dr. Nehemiah Massed: [phone]  - Dra. Moye: [phone]  - Dra. Nicole Kindred: [phone]  En caso de inclemencias del [place], por favor llame a Johnsie Kindred principal al [phone] para una actualizacin sobre el [place] de cualquier retraso o cierre.  Consejos para la medicacin en dermatologa: Por favor, guarde las cajas en las que vienen los medicamentos de uso tpico para ayudarle a seguir las instrucciones sobre dnde y cmo usarlos. Las farmacias generalmente imprimen las instrucciones del medicamento slo en las cajas y no directamente en los tubos del [place].   Si su medicamento es muy caro, por favor, pngase en contacto con Zigmund Daniel llamando al [phone] y presione la opcin 4 o envenos un mensaje a travs de Pharmacist, community.   No podemos decirle cul ser su copago por los medicamentos por adelantado ya que esto es diferente dependiendo de la cobertura de su seguro. Sin embargo, es posible que podamos encontrar un medicamento sustituto a Electrical engineer un formulario para que el seguro cubra el medicamento que se considera necesario.   Si se requiere una autorizacin previa para que su compaa de seguros [place] su medicamento, por favor permtanos de 1 a 2 das hbiles para completar este proceso.  Los precios de los medicamentos varan con frecuencia dependiendo del Environmental consultant de dnde se surte la receta y alguna farmacias pueden ofrecer precios ms baratos.  El sitio web www.goodrx.com tiene cupones para medicamentos de Airline pilot. Los precios aqu no tienen en cuenta lo que podra costar con la ayuda del seguro (puede ser ms barato con su seguro), pero el sitio web puede darle el precio si no utiliz Research scientist (physical sciences).  - Puede imprimir el cupn  correspondiente y llevarlo con su receta a la farmacia.  - Tambin puede pasar por nuestra oficina durante el horario de atencin regular y Charity fundraiser una tarjeta de cupones de GoodRx.  - Si necesita que su receta se enve electrnicamente a una farmacia diferente, informe a nuestra oficina a travs de MyChart de [place] o por telfono llamando al [phone] y presione la opcin 4.  Wound Care Instructions  Cleanse wound gently with soap and water once a day then pat dry with clean gauze. Apply a thing coat of Petrolatum (petroleum jelly, "Vaseline") over the wound (unless you have an allergy to this). We recommend that you use a new, sterile tube of Vaseline. Do not pick or remove scabs. Do not remove the yellow or white "healing tissue" from the base of the wound.  Cover the wound with fresh, clean, nonstick gauze and secure with paper tape. You may use Band-Aids in place of gauze and tape if the would is small enough, but would recommend trimming much of the  tape off as there is often too much. Sometimes Band-Aids can irritate the skin.  You should call the office for your biopsy report after 1 week if you have not already been contacted.  If you experience any problems, such as abnormal amounts of bleeding, swelling, significant bruising, significant pain, or evidence of infection, please call the office immediately.  FOR ADULT SURGERY PATIENTS: If you need something for pain relief you may take 1 extra strength Tylenol (acetaminophen) AND 2 Ibuprofen (200mg  each) together every 4 hours as needed for pain. (do not take these if you are allergic to them or if you have a reason you should not take them.) Typically, you may only need pain medication for 1 to 3 days.    Instructions for [facility]  One or more of your medications was sent to the [facility]. You will receive an email from them and can purchase the medicine through that link.  It will then be mailed to your home at the address you confirmed. If for any reason you do not receive an email from them, please check your spam folder. If you still do not find the email, please let us know. [facility] phone number is [phone].

## 2021-07-07 ENCOUNTER — Encounter: Payer: Self-pay | Admitting: Dermatology

## 2021-07-09 ENCOUNTER — Telehealth: Payer: Self-pay

## 2021-07-09 NOTE — Telephone Encounter (Signed)
Advised patient of results/hd  

## 2021-07-09 NOTE — Telephone Encounter (Signed)
-----   Message from Ralene Bathe, MD sent at 07/06/2021  2:24 PM EST ----- Diagnosis 1. Skin , left lat neck WELL DIFFERENTIATED SQUAMOUS CELL CARCINOMA 2. 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 Skin , right ant thigh WELL DIFFERENTIATED SQUAMOUS CELL CARCINOMA, ACANTHOLYTIC (ADENOID) VARIANT, CRUSTED 4. Skin , right proximal lat pretibial WELL DIFFERENTIATED SQUAMOUS CELL CARCINOMA  1,3,4 - All three showed Cancer - SCC All already treated Recheck next visit 2- Cancer - BCC Already treated Recheck next visit

## 2021-07-15 ENCOUNTER — Ambulatory Visit (INDEPENDENT_AMBULATORY_CARE_PROVIDER_SITE_OTHER): Payer: PPO

## 2021-07-15 ENCOUNTER — Other Ambulatory Visit: Payer: Self-pay

## 2021-07-15 DIAGNOSIS — E538 Deficiency of other specified B group vitamins: Secondary | ICD-10-CM

## 2021-07-15 MED ORDER — CYANOCOBALAMIN 1000 MCG/ML IJ SOLN
1000.0000 ug | Freq: Once | INTRAMUSCULAR | Status: AC
  Administered 2021-07-15: 10:00:00 1000 ug via INTRAMUSCULAR

## 2021-07-22 ENCOUNTER — Other Ambulatory Visit: Payer: Self-pay | Admitting: Dermatology

## 2021-07-22 DIAGNOSIS — L299 Pruritus, unspecified: Secondary | ICD-10-CM

## 2021-08-13 ENCOUNTER — Ambulatory Visit: Payer: PPO

## 2021-08-14 ENCOUNTER — Other Ambulatory Visit: Payer: Self-pay

## 2021-08-14 ENCOUNTER — Ambulatory Visit (INDEPENDENT_AMBULATORY_CARE_PROVIDER_SITE_OTHER): Payer: PPO

## 2021-08-14 DIAGNOSIS — E538 Deficiency of other specified B group vitamins: Secondary | ICD-10-CM

## 2021-08-14 MED ORDER — CYANOCOBALAMIN 1000 MCG/ML IJ SOLN
1000.0000 ug | Freq: Once | INTRAMUSCULAR | Status: AC
  Administered 2021-08-14: 11:00:00 1000 ug via INTRAMUSCULAR

## 2021-08-20 ENCOUNTER — Telehealth: Payer: Self-pay | Admitting: Internal Medicine

## 2021-08-20 ENCOUNTER — Other Ambulatory Visit: Payer: Self-pay | Admitting: Internal Medicine

## 2021-08-20 DIAGNOSIS — E785 Hyperlipidemia, unspecified: Secondary | ICD-10-CM

## 2021-08-20 DIAGNOSIS — I10 Essential (primary) hypertension: Secondary | ICD-10-CM

## 2021-08-20 NOTE — Chronic Care Management (AMB) (Signed)
°  Chronic Care Management   Outreach Note  08/20/2021 Name: Joann Warren MRN: [id number] DOB: Jun 12, 1946  Referred by: Lavera Guise, MD Reason for referral : No chief complaint on file.   An unsuccessful telephone outreach was attempted today. The patient was referred to the pharmacist for assistance with care management and care coordination.   Follow Up Plan:   Tatjana Dellinger Upstream Scheduler

## 2021-09-09 DIAGNOSIS — H401112 Primary open-angle glaucoma, right eye, moderate stage: Secondary | ICD-10-CM | POA: Diagnosis not present

## 2021-09-11 ENCOUNTER — Ambulatory Visit: Payer: PPO

## 2021-09-12 ENCOUNTER — Ambulatory Visit: Payer: PPO

## 2021-09-12 ENCOUNTER — Other Ambulatory Visit: Payer: Self-pay

## 2021-09-12 DIAGNOSIS — E538 Deficiency of other specified B group vitamins: Secondary | ICD-10-CM

## 2021-09-12 MED ORDER — CYANOCOBALAMIN 1000 MCG/ML IJ SOLN
1000.0000 ug | Freq: Once | INTRAMUSCULAR | Status: AC
  Administered 2021-09-12: 1000 ug via INTRAMUSCULAR

## 2021-09-12 NOTE — Progress Notes (Signed)
B12 injection completed

## 2021-09-16 DIAGNOSIS — H401112 Primary open-angle glaucoma, right eye, moderate stage: Secondary | ICD-10-CM | POA: Diagnosis not present

## 2021-10-10 ENCOUNTER — Ambulatory Visit (INDEPENDENT_AMBULATORY_CARE_PROVIDER_SITE_OTHER): Payer: PPO

## 2021-10-10 ENCOUNTER — Other Ambulatory Visit: Payer: Self-pay

## 2021-10-10 DIAGNOSIS — E538 Deficiency of other specified B group vitamins: Secondary | ICD-10-CM | POA: Diagnosis not present

## 2021-10-10 MED ORDER — CYANOCOBALAMIN 1000 MCG/ML IJ SOLN
1000.0000 ug | Freq: Once | INTRAMUSCULAR | Status: AC
  Administered 2021-10-10: 1000 ug via INTRAMUSCULAR

## 2021-10-16 ENCOUNTER — Other Ambulatory Visit: Payer: Self-pay

## 2021-10-16 ENCOUNTER — Ambulatory Visit: Payer: PPO | Admitting: Dermatology

## 2021-10-16 DIAGNOSIS — D485 Neoplasm of uncertain behavior of skin: Secondary | ICD-10-CM

## 2021-10-16 DIAGNOSIS — L570 Actinic keratosis: Secondary | ICD-10-CM

## 2021-10-16 DIAGNOSIS — C44722 Squamous cell carcinoma of skin of right lower limb, including hip: Secondary | ICD-10-CM

## 2021-10-16 DIAGNOSIS — Z85828 Personal history of other malignant neoplasm of skin: Secondary | ICD-10-CM

## 2021-10-16 DIAGNOSIS — C44729 Squamous cell carcinoma of skin of left lower limb, including hip: Secondary | ICD-10-CM | POA: Diagnosis not present

## 2021-10-16 DIAGNOSIS — L578 Other skin changes due to chronic exposure to nonionizing radiation: Secondary | ICD-10-CM

## 2021-10-16 DIAGNOSIS — D492 Neoplasm of unspecified behavior of bone, soft tissue, and skin: Secondary | ICD-10-CM

## 2021-10-16 MED ORDER — MUPIROCIN 2 % EX OINT: TOPICAL_OINTMENT | Freq: Every day | CUTANEOUS | 2 refills | Status: DC

## 2021-10-16 NOTE — Patient Instructions (Signed)

## 2021-10-16 NOTE — Progress Notes (Signed)
Follow-Up Visit   Subjective  Joann Warren is a 76 y.o. female who presents for the following: Follow-up (The patient has spots, moles and lesions to be evaluated, some may be new or changing and the patient has concerns that these could be cancer. ).  The following portions of the chart were reviewed this encounter and updated as appropriate:   Tobacco   Allergies   Meds   Problems   Med Hx   Surg Hx   Fam Hx      Review of Systems:  No other skin or systemic complaints except as noted in HPI or Assessment and Plan.  Objective  Well appearing patient in no apparent distress; mood and affect are within normal limits.  A focused examination was performed including upper extremities, including the arms, hands, fingers, and fingernails and lower. Relevant physical exam findings are noted in the Assessment and Plan.  left distal lateral pretibial x 1 Erythematous thin papules/macules with gritty scale.   left distal pretibial       left medial prox pretibial     right med mid calf        Assessment & Plan  AK (actinic keratosis) left distal lateral pretibial x 1 Hypertrophic Ak  Actinic keratoses are precancerous spots that appear secondary to cumulative UV radiation exposure/sun exposure over time. They are chronic with expected duration over 1 year. A portion of actinic keratoses will progress to squamous cell carcinoma of the skin. It is not possible to reliably predict which spots will progress to skin cancer and so treatment is recommended to prevent development of skin cancer.  Recommend daily broad spectrum sunscreen SPF 30+ to sun-exposed areas, reapply every 2 hours as needed.  Recommend staying in the shade or wearing long sleeves, sun glasses (UVA+UVB protection) and wide brim hats (4-inch brim around the entire circumference of the hat). Call for new or changing lesions.   Recheck at next office visit   Prior to procedure, discussed risks of blister  formation, small wound, skin dyspigmentation, or rare scar following cryotherapy. Recommend Vaseline ointment to treated areas while healing.   Destruction of lesion - left distal lateral pretibial x 1 Complexity: simple   Destruction method: cryotherapy   Informed consent: discussed and consent obtained   Timeout:  patient name, date of birth, surgical site, and procedure verified Lesion destroyed using liquid nitrogen: Yes   Region frozen until ice ball extended beyond lesion: Yes   Outcome: patient tolerated procedure well with no complications   Post-procedure details: wound care instructions given    Neoplasm of skin (3) left distal pretibial Epidermal / dermal shaving  Lesion diameter (cm):  1.5 Informed consent: discussed and consent obtained   Anesthesia: the lesion was anesthetized in a standard fashion   Anesthetic:  1% lidocaine w/ epinephrine 1-100,000 buffered w/ 8.4% NaHCO3 Instrument used: scissors   Hemostasis achieved with: pressure, aluminum chloride and electrodesiccation   Outcome: patient tolerated procedure well   Post-procedure details: wound care instructions given    Destruction of lesion  Destruction method: electrodesiccation and curettage   Informed consent: discussed and consent obtained   Timeout:  patient name, date of birth, surgical site, and procedure verified Anesthesia: the lesion was anesthetized in a standard fashion   Anesthetic:  1% lidocaine w/ epinephrine 1-100,000 buffered w/ 8.4% NaHCO3 Curettage performed in three different directions: Yes   Electrodesiccation performed over the curetted area: Yes   Curettage cycles:  3 Lesion length (cm):  1.5 Lesion width (cm):  1.5 Margin per side (cm):  0.2 Final wound size (cm):  1.9 Hemostasis achieved with:  electrodesiccation Outcome: patient tolerated procedure well with no complications   Post-procedure details: sterile dressing applied and wound care instructions given   Dressing type:  petrolatum    Specimen 1 - Surgical pathology Differential Diagnosis: R/O SCC Check Margins: No  left medial prox pretibial Epidermal / dermal shaving  Lesion diameter (cm):  1.1 Informed consent: discussed and consent obtained   Timeout: patient name, date of birth, surgical site, and procedure verified   Procedure prep:  Patient was prepped and draped in usual sterile fashion Prep type:  Isopropyl alcohol Anesthesia: the lesion was anesthetized in a standard fashion   Anesthetic:  1% lidocaine w/ epinephrine 1-100,000 buffered w/ 8.4% NaHCO3 Hemostasis achieved with: pressure, aluminum chloride and electrodesiccation   Outcome: patient tolerated procedure well   Post-procedure details: sterile dressing applied and wound care instructions given   Dressing type: bandage and petrolatum    Destruction of lesion  Destruction method: electrodesiccation and curettage   Informed consent: discussed and consent obtained   Timeout:  patient name, date of birth, surgical site, and procedure verified Anesthesia: the lesion was anesthetized in a standard fashion   Anesthetic:  1% lidocaine w/ epinephrine 1-100,000 buffered w/ 8.4% NaHCO3 Curettage performed in three different directions: Yes   Electrodesiccation performed over the curetted area: Yes   Curettage cycles:  3 Lesion length (cm):  1.1 Lesion width (cm):  1.1 Margin per side (cm):  0.2 Final wound size (cm):  1.5 Hemostasis achieved with:  electrodesiccation Outcome: patient tolerated procedure well with no complications   Post-procedure details: sterile dressing applied and wound care instructions given   Dressing type: petrolatum    Specimen 2 - Surgical pathology Differential Diagnosis: R/O SCC Check Margins: No  right med mid calf Epidermal / dermal shaving  Lesion diameter (cm):  0.7 Informed consent: discussed and consent obtained   Timeout: patient name, date of birth, surgical site, and procedure verified    Procedure prep:  Patient was prepped and draped in usual sterile fashion Prep type:  Isopropyl alcohol Anesthesia: the lesion was anesthetized in a standard fashion   Anesthetic:  1% lidocaine w/ epinephrine 1-100,000 buffered w/ 8.4% NaHCO3 Hemostasis achieved with: pressure, aluminum chloride and electrodesiccation   Outcome: patient tolerated procedure well   Post-procedure details: sterile dressing applied and wound care instructions given   Dressing type: bandage and petrolatum    Destruction of lesion  Destruction method: electrodesiccation and curettage   Informed consent: discussed and consent obtained   Timeout:  patient name, date of birth, surgical site, and procedure verified Anesthesia: the lesion was anesthetized in a standard fashion   Anesthetic:  1% lidocaine w/ epinephrine 1-100,000 buffered w/ 8.4% NaHCO3 Curettage performed in three different directions: Yes   Electrodesiccation performed over the curetted area: Yes   Curettage cycles:  3 Lesion length (cm):  0.7 Lesion width (cm):  0.7 Margin per side (cm):  0.2 Final wound size (cm):  1.1 Hemostasis achieved with:  electrodesiccation Outcome: patient tolerated procedure well with no complications   Post-procedure details: sterile dressing applied and wound care instructions given   Dressing type: petrolatum    Specimen 3 - Surgical pathology Differential Diagnosis: R/O SCC  Check Margins: No  Related Medications mupirocin ointment (BACTROBAN) 2 % Apply to skin qd-bid  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  These are surely sun damage related but may have HPV component too.  We have  done "field treatments" in past with Photodynamic therapy with Levulan as well as "5-FU Chemowraps".   Because of the persistent issue, I sent patient for evaluation to [facility] to see if she may be a candidate for CAPECITABINE oral (5-FU) chemotherapy - or other agent that patient may benefit from.  She saw Dr Tasia Catchings on 04/10/20 who did not recommend any systemic chemotherapy such as Capecitabine, but advise if she had cancers that were refractory or metastatic, she could be a candidate for immunotherapy such as Cemiplimab. She discussed oral retinoids and oral nicotinamide options. She also discussed possible screening for immunocompromised state with testing for HIV; hepatitis and possible genetic counseling for genetic syndromes predisposing to [facility] formation.   Actinic Damage - chronic, secondary to cumulative UV radiation exposure/sun exposure over time - diffuse scaly erythematous macules with underlying dyspigmentation - Recommend daily broad spectrum sunscreen SPF 30+ to sun-exposed areas, reapply every 2 hours as needed.  - Recommend staying in the shade or wearing long sleeves, sun glasses (UVA+UVB protection) and wide brim hats (4-inch brim around the entire circumference of the hat). - Call for new or changing lesions.  Return in about 3 months (around 01/16/2022) for hx of SCC.  IMarye Round, CMA, am acting as scribe for Sarina Ser, MD .  Documentation: I have reviewed the above documentation for accuracy and completeness, and I agree with the above.  Sarina Ser, MD

## 2021-10-18 ENCOUNTER — Other Ambulatory Visit: Payer: Self-pay | Admitting: Internal Medicine

## 2021-10-18 ENCOUNTER — Encounter: Payer: Self-pay | Admitting: Dermatology

## 2021-10-18 DIAGNOSIS — M810 Age-related osteoporosis without current pathological fracture: Secondary | ICD-10-CM

## 2021-10-21 ENCOUNTER — Telehealth: Payer: Self-pay

## 2021-10-21 NOTE — Telephone Encounter (Signed)
-----   Message from Ralene Bathe, MD sent at 10/17/2021  5:37 PM EST ----- ?Diagnosis ?1. Skin , left distal pretibial ?WELL DIFFERENTIATED SQUAMOUS CELL CARCINOMA, BASE INVOLVED ?2. Skin , left medial prox pretibial ?WELL DIFFERENTIATED SQUAMOUS CELL CARCINOMA, BASE INVOLVED ?3. Skin , right med mid calf ?WELL DIFFERENTIATED SQUAMOUS CELL CARCINOMA, BASE INVOLVED ? ?1,2,3 - All 3 cancer - SCC ?All 3 already treated ?Recheck next visit ?

## 2021-10-21 NOTE — Telephone Encounter (Signed)
Advised pt of bx result/sh ?

## 2021-10-24 ENCOUNTER — Telehealth: Payer: Self-pay

## 2021-10-24 MED ORDER — DOXYCYCLINE HYCLATE 100 MG PO TABS: ORAL_TABLET | ORAL | 0 refills | Status: DC

## 2021-10-24 NOTE — Telephone Encounter (Signed)
Left message on voicemail returning patient's call. Per voicemail sites treated at her last visit are very painful with surrounding erythema. She did use Mupirocin 2% ointment on sites, but that seemed to exacerbate the pain so she stopped using it. Sites are now scabbed and patient wants to know if there is anything that can be done for the pain and surrounding erythema. She states she had a similar reaction to Neosporin in the past which is something she turned out being allergic to. Please advise.  ?

## 2021-10-24 NOTE — Telephone Encounter (Signed)
Patient informed of instruction and Doxycycline sent to pharmacy.  ?

## 2021-11-07 ENCOUNTER — Ambulatory Visit (INDEPENDENT_AMBULATORY_CARE_PROVIDER_SITE_OTHER): Payer: PPO

## 2021-11-07 ENCOUNTER — Other Ambulatory Visit: Payer: Self-pay

## 2021-11-07 DIAGNOSIS — E538 Deficiency of other specified B group vitamins: Secondary | ICD-10-CM

## 2021-11-08 DIAGNOSIS — E538 Deficiency of other specified B group vitamins: Secondary | ICD-10-CM

## 2021-11-08 MED ORDER — CYANOCOBALAMIN 1000 MCG/ML IJ SOLN
1000.0000 ug | Freq: Once | INTRAMUSCULAR | Status: AC
  Administered 2021-11-08: 1000 ug via INTRAMUSCULAR

## 2021-11-21 ENCOUNTER — Other Ambulatory Visit: Payer: Self-pay | Admitting: Internal Medicine

## 2021-11-21 DIAGNOSIS — E785 Hyperlipidemia, unspecified: Secondary | ICD-10-CM

## 2021-11-21 DIAGNOSIS — I10 Essential (primary) hypertension: Secondary | ICD-10-CM

## 2021-12-10 ENCOUNTER — Ambulatory Visit (INDEPENDENT_AMBULATORY_CARE_PROVIDER_SITE_OTHER): Payer: PPO

## 2021-12-10 DIAGNOSIS — E538 Deficiency of other specified B group vitamins: Secondary | ICD-10-CM

## 2021-12-10 MED ORDER — CYANOCOBALAMIN 1000 MCG/ML IJ SOLN
1000.0000 ug | Freq: Once | INTRAMUSCULAR | Status: AC
  Administered 2021-12-10: 1000 ug via INTRAMUSCULAR

## 2021-12-16 ENCOUNTER — Ambulatory Visit: Payer: PPO | Admitting: Dermatology

## 2021-12-16 DIAGNOSIS — C4492 Squamous cell carcinoma of skin, unspecified: Secondary | ICD-10-CM

## 2021-12-16 DIAGNOSIS — L299 Pruritus, unspecified: Secondary | ICD-10-CM

## 2021-12-16 DIAGNOSIS — C44622 Squamous cell carcinoma of skin of right upper limb, including shoulder: Secondary | ICD-10-CM

## 2021-12-16 DIAGNOSIS — L578 Other skin changes due to chronic exposure to nonionizing radiation: Secondary | ICD-10-CM | POA: Diagnosis not present

## 2021-12-16 DIAGNOSIS — C44729 Squamous cell carcinoma of skin of left lower limb, including hip: Secondary | ICD-10-CM

## 2021-12-16 DIAGNOSIS — D489 Neoplasm of uncertain behavior, unspecified: Secondary | ICD-10-CM

## 2021-12-16 DIAGNOSIS — D485 Neoplasm of uncertain behavior of skin: Secondary | ICD-10-CM

## 2021-12-16 HISTORY — DX: Squamous cell carcinoma of skin, unspecified: C44.92

## 2021-12-16 MED ORDER — DOXYCYCLINE HYCLATE 100 MG PO TABS: ORAL_TABLET | ORAL | 0 refills | Status: DC

## 2021-12-16 MED ORDER — HYDROXYZINE HCL 10 MG PO TABS: ORAL_TABLET | ORAL | 4 refills | Status: DC

## 2021-12-16 MED ORDER — MUPIROCIN 2 % EX OINT: TOPICAL_OINTMENT | Freq: Every day | CUTANEOUS | 2 refills | Status: DC

## 2021-12-16 NOTE — Progress Notes (Signed)
? ?Follow-Up Visit ?  ?Subjective  ?Joann Warren is a 76 y.o. female who presents for the following: Follow-up (Patient reports a spot at left leg that was treated in march but is slow healing /Spot at right arm noticed back in march believes is skin cancer. ). ?The patient has spots, moles and lesions to be evaluated, some may be new or changing and the patient has concerns that these could be cancer. ? ?The following portions of the chart were reviewed this encounter and updated as appropriate:  Tobacco  Allergies  Meds  Problems  Med Hx  Surg Hx  Fam Hx   ?  ?Review of Systems: No other skin or systemic complaints except as noted in HPI or Assessment and Plan. ? ?Objective  ?Well appearing patient in no apparent distress; mood and affect are within normal limits. ? ?A focused examination was performed including left lower leg, right forearm . Relevant physical exam findings are noted in the Assessment and Plan. ? ?right forearm ?1.2 cm pink papule  ? ? ? ? ? ? ?left pretibia ?1.7 cm pink papule  ? ? ? ? ? ? ? ?Assessment & Plan  ?Neoplasm of uncertain behavior (2) ?right forearm ?Epidermal / dermal shaving ? ?Lesion diameter (cm):  1.2 ?Informed consent: discussed and consent obtained   ?Timeout: patient name, date of birth, surgical site, and procedure verified   ?Procedure prep:  Patient was prepped and draped in usual sterile fashion ?Prep type:  Isopropyl alcohol ?Anesthesia: the lesion was anesthetized in a standard fashion   ?Anesthetic:  1% lidocaine w/ epinephrine 1-100,000 buffered w/ 8.4% NaHCO3 ?Instrument used: flexible razor blade   ?Hemostasis achieved with: pressure, aluminum chloride and electrodesiccation   ?Outcome: patient tolerated procedure well   ?Post-procedure details: sterile dressing applied and wound care instructions given   ?Dressing type: bandage and petrolatum   ? ?Destruction of lesion ?Complexity: extensive   ?Destruction method: electrodesiccation and curettage    ?Informed consent: discussed and consent obtained   ?Timeout:  patient name, date of birth, surgical site, and procedure verified ?Procedure prep:  Patient was prepped and draped in usual sterile fashion ?Prep type:  Isopropyl alcohol ?Anesthesia: the lesion was anesthetized in a standard fashion   ?Anesthetic:  1% lidocaine w/ epinephrine 1-100,000 buffered w/ 8.4% NaHCO3 ?Curettage performed in three different directions: Yes   ?Electrodesiccation performed over the curetted area: Yes   ?Lesion length (cm):  1.2 ?Lesion width (cm):  1.2 ?Margin per side (cm):  0.2 ?Final wound size (cm):  1.6 ?Hemostasis achieved with:  pressure, aluminum chloride and electrodesiccation ?Outcome: patient tolerated procedure well with no complications   ?Post-procedure details: sterile dressing applied and wound care instructions given   ?Dressing type: bandage and petrolatum   ? ?Specimen 1 - Surgical pathology ?Differential Diagnosis: r/o scc ?Check Margins: No ? ?left pretibia ?Epidermal / dermal shaving ? ?Lesion diameter (cm):  1.7 ?Informed consent: discussed and consent obtained   ?Timeout: patient name, date of birth, surgical site, and procedure verified   ?Procedure prep:  Patient was prepped and draped in usual sterile fashion ?Prep type:  Isopropyl alcohol ?Anesthesia: the lesion was anesthetized in a standard fashion   ?Anesthetic:  1% lidocaine w/ epinephrine 1-100,000 buffered w/ 8.4% NaHCO3 ?Instrument used: flexible razor blade   ?Hemostasis achieved with: pressure, aluminum chloride and electrodesiccation   ?Outcome: patient tolerated procedure well   ?Post-procedure details: sterile dressing applied and wound care instructions given   ?Dressing type:  bandage and petrolatum   ? ?Destruction of lesion ?Complexity: extensive   ?Destruction method: electrodesiccation and curettage   ?Informed consent: discussed and consent obtained   ?Timeout:  patient name, date of birth, surgical site, and procedure  verified ?Procedure prep:  Patient was prepped and draped in usual sterile fashion ?Prep type:  Isopropyl alcohol ?Anesthesia: the lesion was anesthetized in a standard fashion   ?Anesthetic:  1% lidocaine w/ epinephrine 1-100,000 buffered w/ 8.4% NaHCO3 ?Curettage performed in three different directions: Yes   ?Electrodesiccation performed over the curetted area: Yes   ?Lesion length (cm):  1.7 ?Lesion width (cm):  1.7 ?Margin per side (cm):  0.2 ?Final wound size (cm):  2.1 ?Hemostasis achieved with:  pressure, aluminum chloride and electrodesiccation ?Outcome: patient tolerated procedure well with no complications   ?Post-procedure details: sterile dressing applied and wound care instructions given   ?Dressing type: bandage and petrolatum   ? ?Specimen 2 - Surgical pathology ?Differential Diagnosis: r/o scc ?Check Margins: No ?R/o scc  ?Treated with ED&C today  ? ?Related Medications ?doxycycline (VIBRA-TABS) 100 MG tablet ?Take one tab po BID with food x 1 week. ? ?Pruritus ?Skin ?Chronic and persistent condition with duration or expected duration over one year. Condition is symptomatic / bothersome to patient. Not to goal. ?Related Medications ?hydrOXYzine (ATARAX) 10 MG tablet ?TAKE ONE TABLET BY MOUTH ONCE TO TWICE DAILY AT NIGHT AS NEEDED FOR ITCHING ? ?Actinic Damage ?- chronic, secondary to cumulative UV radiation exposure/sun exposure over time ?- diffuse scaly erythematous macules with underlying dyspigmentation ?- Recommend daily broad spectrum sunscreen SPF 30+ to sun-exposed areas, reapply every 2 hours as needed.  ?- Recommend staying in the shade or wearing long sleeves, sun glasses (UVA+UVB protection) and wide brim hats (4-inch brim around the entire circumference of the hat). ?- Call for new or changing lesions. ? ?Return in about 4 months (around 04/18/2022) for ak followup recheck bx sites . ?Garry Heater, CMA, am acting as scribe for Sarina Ser, MD. ?Documentation: I have reviewed the  above documentation for accuracy and completeness, and I agree with the above. ? ?Sarina Ser, MD ? ? ?

## 2021-12-16 NOTE — Patient Instructions (Addendum)
?Biopsy Wound Care Instructions ? ?Leave the original bandage on for 24 hours if possible.  If the bandage becomes soaked or soiled before that time, it is OK to remove it and examine the wound.  A small amount of post-operative bleeding is normal.  If excessive bleeding occurs, remove the bandage, place gauze over the site and apply continuous pressure (no peeking) over the area for 30 minutes. If this does not work, please call our clinic as soon as possible or page your doctor if it is after hours.  ? ?Once a day, cleanse the wound with soap and water. It is fine to shower. If a thick crust develops you may use a Q-tip dipped into dilute hydrogen peroxide (mix 1:1 with water) to dissolve it.  Hydrogen peroxide can slow the healing process, so use it only as needed.   ? ?After washing, apply petroleum jelly (Vaseline) or an antibiotic ointment if your doctor prescribed one for you, followed by a bandage.   ? ?For best healing, the wound should be covered with a layer of ointment at all times. If you are not able to keep the area covered with a bandage to hold the ointment in place, this may mean re-applying the ointment several times a day.  Continue this wound care until the wound has healed and is no longer open.  ? ?Itching and mild discomfort is normal during the healing process. However, if you develop pain or severe itching, please call our office.  ? ?If you have any discomfort, you can take Tylenol (acetaminophen) or ibuprofen as directed on the bottle. (Please do not take these if you have an allergy to them or cannot take them for another reason). ? ?Some redness, tenderness and white or yellow material in the wound is normal healing.  If the area becomes very sore and red, or develops a thick yellow-green material (pus), it may be infected; please notify us.   ? ?If you have stitches, return to clinic as directed to have the stitches removed. You will continue wound care for 2-3 days after the stitches  are removed.  ? ?Wound healing continues for up to one year following surgery. It is not unusual to experience pain in the scar from time to time during the interval.  If the pain becomes severe or the scar thickens, you should notify the office.   ? ?A slight amount of redness in a scar is expected for the first six months.  After six months, the redness will fade and the scar will soften and fade.  The color difference becomes less noticeable with time.  If there are any problems, return for a post-op surgery check at your earliest convenience. ? ?To improve the appearance of the scar, you can use silicone scar gel, cream, or sheets (such as Mederma or Serica) every night for up to one year. These are available over the counter (without a prescription). ? ?Please call our office at [phone] for any questions or concerns. ? ? ?Electrodesiccation and Curettage (?Scrape and Burn?) Wound Care Instructions ? ?Leave the original bandage on for 24 hours if possible.  If the bandage becomes soaked or soiled before that time, it is OK to remove it and examine the wound.  A small amount of post-operative bleeding is normal.  If excessive bleeding occurs, remove the bandage, place gauze over the site and apply continuous pressure (no peeking) over the area for 30 minutes. If this does not work, please call our  clinic as soon as possible or page your doctor if it is after hours.  ? ?Once a day, cleanse the wound with soap and water. It is fine to shower. If a thick crust develops you may use a Q-tip dipped into dilute hydrogen peroxide (mix 1:1 with water) to dissolve it.  Hydrogen peroxide can slow the healing process, so use it only as needed.   ? ?After washing, apply petroleum jelly (Vaseline) or an antibiotic ointment if your doctor prescribed one for you, followed by a bandage.   ? ?For best healing, the wound should be covered with a layer of ointment at all times. If you are not able to keep the area covered with  a bandage to hold the ointment in place, this may mean re-applying the ointment several times a day.  Continue this wound care until the wound has healed and is no longer open. It may take several weeks for the wound to heal and close. ? ?Itching and mild discomfort is normal during the healing process. ? ?If you have any discomfort, you can take Tylenol (acetaminophen) or ibuprofen as directed on the bottle. (Please do not take these if you have an allergy to them or cannot take them for another reason). ? ?Some redness, tenderness and white or yellow material in the wound is normal healing.  If the area becomes very sore and red, or develops a thick yellow-green material (pus), it may be infected; please notify us.   ? ?Wound healing continues for up to one year following surgery. It is not unusual to experience pain in the scar from time to time during the interval.  If the pain becomes severe or the scar thickens, you should notify the office.   ? ?A slight amount of redness in a scar is expected for the first six months.  After six months, the redness will fade and the scar will soften and fade.  The color difference becomes less noticeable with time.  If there are any problems, return for a post-op surgery check at your earliest convenience. ? ?To improve the appearance of the scar, you can use silicone scar gel, cream, or sheets (such as Mederma or Serica) every night for up to one year. These are available over the counter (without a prescription). ? ?Please call our office at [phone] for any questions or concerns. ? ? ?If You Need Anything After Your Visit ? ?If you have any questions or concerns for your doctor, please call our main line at [phone] and press option 4 to reach your doctor's medical assistant. If no one answers, please leave a voicemail as directed and we will return your call as soon as possible. Messages left after 4 pm will be answered the following business day.  ? ?You may  also send [place] a message via MyChart. We typically respond to MyChart messages within 1-2 business days. ? ?For prescription refills, please ask your pharmacy to contact our office. Our fax number is [phone]. ? ?If you have an urgent issue when the clinic is closed that cannot wait until the next business day, you can page your doctor at the number below.   ? ?Please note that while we do our best to be available for urgent issues outside of office hours, we are not available 24/7.  ? ?If you have an urgent issue and are unable to reach [place], you may choose to seek medical care at your doctor's office, retail clinic, urgent care center, or  emergency room. ? ?If you have a medical emergency, please immediately call 911 or go to the emergency department. ? ?Pager Numbers ? ?- Dr. Nehemiah Massed: [phone] ? ?- Dr. Laurence Ferrari: [phone] ? ?- Dr. Nicole Kindred: [phone] ? ?In the event of inclement weather, please call our main line at [phone] for an update on the status of any delays or closures. ? ?Dermatology Medication Tips: ?Please keep the boxes that topical medications come in in order to help keep track of the instructions about where and how to use these. Pharmacies typically print the medication instructions only on the boxes and not directly on the medication tubes.  ? ?If your medication is too expensive, please contact our office at [phone] option 4 or send [place] a message through [place].  ? ?We are unable to tell what your co-pay for medications will be in advance as this is different depending on your insurance coverage. However, we may be able to find a substitute medication at lower cost or fill out paperwork to get insurance to cover a needed medication.  ? ?If a prior authorization is required to get your medication covered by your insurance company, please allow [place] 1-2 business days to complete this process. ? ?Drug prices often vary depending on where the prescription is filled and some pharmacies  may offer cheaper prices. ? ?The website www.goodrx.com contains coupons for medications through different pharmacies. The prices here do not account for what the cost may be with help from insurance

## 2021-12-18 ENCOUNTER — Telehealth: Payer: Self-pay

## 2021-12-18 NOTE — Telephone Encounter (Signed)
-----   Message from Ralene Bathe, MD sent at 12/17/2021  8:58 PM EDT ----- ?Diagnosis ?1. Skin , right forearm ?WELL DIFFERENTIATED SQUAMOUS CELL CARCINOMA WITH SUPERFICIAL INFILTRATION, CLOSE TO MARGIN ?2. Skin , left pretibia ?WELL DIFFERENTIATED SQUAMOUS CELL CARCINOMA, BASE INVOLVED ? ?1&2 -both cancer-SCC ?Both already treated. ?Recheck next visit ?

## 2021-12-18 NOTE — Telephone Encounter (Signed)
Advised pt of bx result/sh ?

## 2021-12-24 ENCOUNTER — Encounter: Payer: Self-pay | Admitting: Dermatology

## 2022-01-06 ENCOUNTER — Ambulatory Visit (INDEPENDENT_AMBULATORY_CARE_PROVIDER_SITE_OTHER): Payer: PPO

## 2022-01-06 DIAGNOSIS — E538 Deficiency of other specified B group vitamins: Secondary | ICD-10-CM | POA: Diagnosis not present

## 2022-01-06 MED ORDER — CYANOCOBALAMIN 1000 MCG/ML IJ SOLN
1000.0000 ug | Freq: Once | INTRAMUSCULAR | Status: AC
  Administered 2022-01-06: 1000 ug via INTRAMUSCULAR

## 2022-01-16 ENCOUNTER — Ambulatory Visit: Payer: PPO | Admitting: Dermatology

## 2022-02-04 ENCOUNTER — Ambulatory Visit (INDEPENDENT_AMBULATORY_CARE_PROVIDER_SITE_OTHER): Payer: PPO

## 2022-02-04 DIAGNOSIS — E538 Deficiency of other specified B group vitamins: Secondary | ICD-10-CM | POA: Diagnosis not present

## 2022-02-04 MED ORDER — CYANOCOBALAMIN 1000 MCG/ML IJ SOLN
1000.0000 ug | Freq: Once | INTRAMUSCULAR | Status: AC
  Administered 2022-02-04: 1000 ug via INTRAMUSCULAR

## 2022-03-04 ENCOUNTER — Ambulatory Visit (INDEPENDENT_AMBULATORY_CARE_PROVIDER_SITE_OTHER): Payer: PPO

## 2022-03-04 DIAGNOSIS — E538 Deficiency of other specified B group vitamins: Secondary | ICD-10-CM

## 2022-03-04 MED ORDER — CYANOCOBALAMIN 1000 MCG/ML IJ SOLN
1000.0000 ug | Freq: Once | INTRAMUSCULAR | Status: AC
Start: 2022-03-04 — End: 2022-03-04
  Administered 2022-03-04: 1000 ug via INTRAMUSCULAR

## 2022-03-18 DIAGNOSIS — H401112 Primary open-angle glaucoma, right eye, moderate stage: Secondary | ICD-10-CM | POA: Diagnosis not present

## 2022-03-28 ENCOUNTER — Other Ambulatory Visit: Payer: Self-pay | Admitting: Internal Medicine

## 2022-03-28 DIAGNOSIS — Z1231 Encounter for screening mammogram for malignant neoplasm of breast: Secondary | ICD-10-CM

## 2022-04-01 ENCOUNTER — Ambulatory Visit (INDEPENDENT_AMBULATORY_CARE_PROVIDER_SITE_OTHER): Payer: PPO

## 2022-04-01 DIAGNOSIS — E538 Deficiency of other specified B group vitamins: Secondary | ICD-10-CM

## 2022-04-01 MED ORDER — CYANOCOBALAMIN 1000 MCG/ML IJ SOLN
1000.0000 ug | Freq: Once | INTRAMUSCULAR | Status: AC
  Administered 2022-04-01: 1000 ug via INTRAMUSCULAR

## 2022-04-02 ENCOUNTER — Other Ambulatory Visit: Payer: Self-pay | Admitting: Nurse Practitioner

## 2022-04-15 ENCOUNTER — Ambulatory Visit: Payer: Self-pay | Admitting: Nurse Practitioner

## 2022-04-23 ENCOUNTER — Ambulatory Visit: Payer: PPO | Admitting: Dermatology

## 2022-04-23 ENCOUNTER — Telehealth: Payer: Self-pay | Admitting: Nurse Practitioner

## 2022-04-23 DIAGNOSIS — D492 Neoplasm of unspecified behavior of bone, soft tissue, and skin: Secondary | ICD-10-CM

## 2022-04-23 DIAGNOSIS — L578 Other skin changes due to chronic exposure to nonionizing radiation: Secondary | ICD-10-CM

## 2022-04-23 DIAGNOSIS — C44729 Squamous cell carcinoma of skin of left lower limb, including hip: Secondary | ICD-10-CM

## 2022-04-23 DIAGNOSIS — Z85828 Personal history of other malignant neoplasm of skin: Secondary | ICD-10-CM

## 2022-04-23 NOTE — Progress Notes (Signed)
Follow-Up Visit   Subjective  Joann Warren is a 76 y.o. female who presents for the following: check spots (L pretibia, L breast, both areas been bx in past). The patient has spots, moles and lesions to be evaluated, some may be new or changing and the patient has concerns that these could be cancer.  Patient has had multiple squamous cell carcinomas and basal cell carcinomas treated in the past.  She is also had radiation on bilateral lower legs for squamous cell carcinoma treatment.  She has been evaluated for radiation for systemic oral 5-FU treatment for her multiple squamous cell carcinomas, but oncology felt that it was not feasible since they were localized and not metastatic.  The following portions of the chart were reviewed this encounter and updated as appropriate:   Tobacco  Allergies  Meds  Problems  Med Hx  Surg Hx  Fam Hx     Review of Systems:  No other skin or systemic complaints except as noted in HPI or Assessment and Plan.  Objective  Well appearing patient in no apparent distress; mood and affect are within normal limits.  A focused examination was performed including left leg, left breast. Relevant physical exam findings are noted in the Assessment and Plan.  L inf medial breast Firm pap 1.0cm  L mid distal pretibial lat L mid to distal pretibial lat 1.9cm keratotic pap     L mid distal pretibial medial L mid to distal pretibial medial 1.5cm keratotic pap      Assessment & Plan  Neoplasm of skin (3)  L mid distal pretibial lat Epidermal / dermal shaving  Lesion diameter (cm):  1.9 Informed consent: discussed and consent obtained   Timeout: patient name, date of birth, surgical site, and procedure verified   Procedure prep:  Patient was prepped and draped in usual sterile fashion Prep type:  Isopropyl alcohol Anesthesia: the lesion was anesthetized in a standard fashion   Anesthetic:  1% lidocaine w/ epinephrine 1-100,000 buffered w/  8.4% NaHCO3 Instrument used: flexible razor blade   Hemostasis achieved with: pressure, aluminum chloride and electrodesiccation   Outcome: patient tolerated procedure well   Post-procedure details: sterile dressing applied and wound care instructions given   Dressing type: bandage and bacitracin    Destruction of lesion Complexity: extensive   Destruction method: electrodesiccation and curettage   Informed consent: discussed and consent obtained   Timeout:  patient name, date of birth, surgical site, and procedure verified Procedure prep:  Patient was prepped and draped in usual sterile fashion Prep type:  Isopropyl alcohol Anesthesia: the lesion was anesthetized in a standard fashion   Anesthetic:  1% lidocaine w/ epinephrine 1-100,000 buffered w/ 8.4% NaHCO3 Curettage performed in three different directions: Yes   Electrodesiccation performed over the curetted area: Yes   Lesion length (cm):  1.9 Lesion width (cm):  1.9 Margin per side (cm):  0.2 Final wound size (cm):  2.3 Hemostasis achieved with:  pressure, aluminum chloride and electrodesiccation Outcome: patient tolerated procedure well with no complications   Post-procedure details: sterile dressing applied and wound care instructions given   Dressing type: bandage and bacitracin    Specimen 1 - Surgical pathology Differential Diagnosis: D 48.5 R/O SCC  Check Margins: No 1.9cm keratotic pap EDC today  L mid distal pretibial medial Epidermal / dermal shaving  Lesion diameter (cm):  1.5 Informed consent: discussed and consent obtained   Timeout: patient name, date of birth, surgical site, and procedure verified  Procedure prep:  Patient was prepped and draped in usual sterile fashion Prep type:  Isopropyl alcohol Anesthesia: the lesion was anesthetized in a standard fashion   Anesthetic:  1% lidocaine w/ epinephrine 1-100,000 buffered w/ 8.4% NaHCO3 Instrument used: flexible razor blade   Hemostasis achieved with:  pressure, aluminum chloride and electrodesiccation   Outcome: patient tolerated procedure well   Post-procedure details: sterile dressing applied and wound care instructions given   Dressing type: bandage and bacitracin    Destruction of lesion Complexity: extensive   Destruction method: electrodesiccation and curettage   Informed consent: discussed and consent obtained   Timeout:  patient name, date of birth, surgical site, and procedure verified Procedure prep:  Patient was prepped and draped in usual sterile fashion Prep type:  Isopropyl alcohol Anesthesia: the lesion was anesthetized in a standard fashion   Anesthetic:  1% lidocaine w/ epinephrine 1-100,000 buffered w/ 8.4% NaHCO3 Curettage performed in three different directions: Yes   Electrodesiccation performed over the curetted area: Yes   Lesion length (cm):  1.5 Lesion width (cm):  1.5 Margin per side (cm):  0.2 Final wound size (cm):  1.9 Hemostasis achieved with:  pressure, aluminum chloride and electrodesiccation Outcome: patient tolerated procedure well with no complications   Post-procedure details: sterile dressing applied and wound care instructions given   Dressing type: bandage and bacitracin    Specimen 2 - Surgical pathology Differential Diagnosis: D48.5 R/O SCC   Check Margins: No 1.5cm keratotic pap EDC today  L inf medial breast Recurrent BCC vs other  recommend excising, will schedule for surgery  Related Medications mupirocin ointment (BACTROBAN) 2 % Apply to skin qd-bid  Actinic Damage - chronic, secondary to cumulative UV radiation exposure/sun exposure over time - diffuse scaly erythematous macules with underlying dyspigmentation - Recommend daily broad spectrum sunscreen SPF 30+ to sun-exposed areas, reapply every 2 hours as needed.  - Recommend staying in the shade or wearing long sleeves, sun glasses (UVA+UVB protection) and wide brim hats (4-inch brim around the entire circumference of the  hat). - Call for new or changing lesions.  History of Squamous Cell Carcinoma of the Skin -multiple Problem list History of Squamous Cell Carcinoma of the Skin - Numerous, S/P radiation on the B/L lower legs Pt has had multiple;multiple SCCs treated in the past and we have done Radiation of both lower legs with Dr Baruch Gouty at [facility] with some control and improvement, but she has persistent frequently occurring SCCs that we treat several new ones every few weeks.  These are surely sun damage related but may have HPV component too.  We have done "field treatments" in past with Photodynamic therapy with Levulan as well as "5-FU Chemowraps".   Because of the persistent issue, I sent patient for evaluation to [facility] to see if she may be a candidate for CAPECITABINE oral (5-FU) chemotherapy - or other agent that patient may benefit from.  She saw Dr Tasia Catchings on 04/10/20 who did not recommend any systemic chemotherapy such as Capecitabine, but advise if she had cancers that were refractory or metastatic, she could be a candidate for immunotherapy such as Cemiplimab. She discussed oral retinoids and oral nicotinamide options. She also discussed possible screening for immunocompromised state with testing for HIV; hepatitis and possible genetic counseling for genetic syndromes predisposing to SCC formation.   - No evidence of recurrence today - No lymphadenopathy - Recommend regular full body skin exams - Recommend daily broad spectrum sunscreen SPF  30+ to sun-exposed areas, reapply every 2 hours as needed.  - Call if any new or changing lesions are noted between office visits  Return for surgery for L inf medial breast r/o Recurrent BCC.  I, Othelia Pulling, RMA, am acting as scribe for Sarina Ser, MD . Documentation: I have reviewed the above documentation for accuracy and completeness, and I agree with the above.  Sarina Ser, MD

## 2022-04-23 NOTE — Patient Instructions (Addendum)
Wound Care Instructions  Cleanse wound gently with soap and water once a day then pat dry with clean gauze. Apply a thin coat of Petrolatum (petroleum jelly, "Vaseline") over the wound (unless you have an allergy to this). We recommend that you use a new, sterile tube of Vaseline. Do not pick or remove scabs. Do not remove the yellow or white "healing tissue" from the base of the wound.  Cover the wound with fresh, clean, nonstick gauze and secure with paper tape. You may use Band-Aids in place of gauze and tape if the wound is small enough, but would recommend trimming much of the tape off as there is often too much. Sometimes Band-Aids can irritate the skin.  You should call the office for your biopsy report after 1 week if you have not already been contacted.  If you experience any problems, such as abnormal amounts of bleeding, swelling, significant bruising, significant pain, or evidence of infection, please call the office immediately.  FOR ADULT SURGERY PATIENTS: If you need something for pain relief you may take 1 extra strength Tylenol (acetaminophen) AND 2 Ibuprofen ('200mg'$  each) together every 4 hours as needed for pain. (do not take these if you are allergic to them or if you have a reason you should not take them.) Typically, you may only need pain medication for 1 to 3 days.        Pre-Operative Instructions  You are scheduled for a surgical procedure at [facility]. We recommend you read the following instructions. If you have any questions or concerns, please call the office at [phone].  Shower and wash the entire body with soap and water the day of your surgery paying special attention to cleansing at and around the planned surgery site.  Avoid aspirin or aspirin containing products at least fourteen (14) days prior to your surgical procedure and for at least one week (7 Days) after your surgical procedure. If you take aspirin on a regular basis for heart disease  or history of stroke or for any other reason, we may recommend you continue taking aspirin but please notify us if you take this on a regular basis. Aspirin can cause more bleeding to occur during surgery as well as prolonged bleeding and bruising after surgery.   Avoid other nonsteroidal pain medications at least one week prior to surgery and at least one week prior to your surgery. These include medications such as Ibuprofen (Motrin, Advil and Nuprin), Naprosyn, Voltaren, Relafen, etc. If medications are used for therapeutic reasons, please inform us as they can cause increased bleeding or prolonged bleeding during and bruising after surgical procedures.   Please advise [place] if you are taking any "blood thinner" medications such as Coumadin or Dipyridamole or Plavix or similar medications. These cause increased bleeding and prolonged bleeding during procedures and bruising after surgical procedures. We may have to consider discontinuing these medications briefly prior to and shortly after your surgery if safe to do so.   Please inform us of all medications you are currently taking. All medications that are taken regularly should be taken the day of surgery as you always do. Nevertheless, we need to be informed of what medications you are taking prior to surgery to know whether they will affect the procedure or cause any complications.   Please inform us of any medication allergies. Also inform us of whether you have allergies to Latex or rubber products or whether you have had any adverse reaction to Lidocaine or Epinephrine.  Please inform us of any prosthetic or artificial body parts such as artificial heart valve, joint replacements, etc., or similar condition that might require preoperative antibiotics.   We recommend avoidance of alcohol at least two weeks prior to surgery and continued avoidance for at least two weeks after surgery.   We recommend discontinuation of tobacco smoking at least two  weeks prior to surgery and continued abstinence for at least two weeks after surgery.  Do not plan strenuous exercise, strenuous work or strenuous lifting for approximately four weeks after your surgery.   We request if you are unable to make your scheduled surgical appointment, please call us at least a week in advance or as soon as you are aware of a problem so that we can cancel or reschedule the appointment.   You MAY TAKE TYLENOL (acetaminophen) for pain as it is not a blood thinner.   PLEASE PLAN TO BE IN TOWN FOR TWO WEEKS FOLLOWING SURGERY, THIS IS IMPORTANT SO YOU CAN BE CHECKED FOR DRESSING CHANGES, SUTURE REMOVAL AND TO MONITOR FOR POSSIBLE COMPLICATIONS.      Due to recent changes in healthcare laws, you may see results of your pathology and/or laboratory studies on MyChart before the doctors have had a chance to review them. We understand that in some cases there may be results that are confusing or concerning to you. Please understand that not all results are received at the same time and often the doctors may need to interpret multiple results in order to provide you with the best plan of care or course of treatment. Therefore, we ask that you please give [place] 2 business days to thoroughly review all your results before contacting the office for clarification. Should we see a critical lab result, you will be contacted sooner.   If You Need Anything After Your Visit  If you have any questions or concerns for your doctor, please call our main line at [phone] and press option 4 to reach your doctor's medical assistant. If no one answers, please leave a voicemail as directed and we will return your call as soon as possible. Messages left after 4 pm will be answered the following business day.   You may also send [place] a message via [place]. We typically respond to MyChart messages within 1-2 business days.  For prescription refills, please ask your pharmacy to contact our office. Our  fax number is [phone].  If you have an urgent issue when the clinic is closed that cannot wait until the next business day, you can page your doctor at the number below.    Please note that while we do our best to be available for urgent issues outside of office hours, we are not available 24/7.   If you have an urgent issue and are unable to reach [place], you may choose to seek medical care at your doctor's office, retail clinic, urgent care center, or emergency room.  If you have a medical emergency, please immediately call 911 or go to the emergency department.  Pager Numbers  - Dr. Nehemiah Massed: [phone]  - Dr. Laurence Ferrari: [phone]  - Dr. Nicole Kindred: [phone]  In the event of inclement weather, please call our main line at [phone] for an update on the status of any delays or closures.  Dermatology Medication Tips: Please keep the boxes that topical medications come in in order to help keep track of the instructions about where and how to use these. Pharmacies typically print the medication instructions only on  the boxes and not directly on the medication tubes.   If your medication is too expensive, please contact our office at [phone] option 4 or send [place] a message through [place].   We are unable to tell what your co-pay for medications will be in advance as this is different depending on your insurance coverage. However, we may be able to find a substitute medication at lower cost or fill out paperwork to get insurance to cover a needed medication.   If a prior authorization is required to get your medication covered by your insurance company, please allow [place] 1-2 business days to complete this process.  Drug prices often vary depending on where the prescription is filled and some pharmacies may offer cheaper prices.  The website www.goodrx.com contains coupons for medications through different pharmacies. The prices here do not account for what the cost may be with help  from insurance (it may be cheaper with your insurance), but the website can give you the price if you did not use any insurance.  - You can print the associated coupon and take it with your prescription to the pharmacy.  - You may also stop by our office during regular business hours and pick up a GoodRx coupon card.  - If you need your prescription sent electronically to a different pharmacy, notify our office through [facility] or by phone at [phone] option 4.     Si Usted Necesita Algo Despus de Su Visita  Tambin puede enviarnos un mensaje a travs de Pharmacist, community. Por lo general respondemos a los mensajes de MyChart en el transcurso de 1 a 2 das hbiles.  Para renovar recetas, por favor pida a su farmacia que se ponga en contacto con nuestra oficina. Harland Dingwall de fax es [place] [phone].  Si tiene un asunto urgente cuando la clnica est cerrada y que no puede esperar hasta el siguiente da hbil, puede llamar/localizar a su doctor(a) al nmero que aparece a continuacin.   Por favor, tenga en cuenta que aunque hacemos todo lo posible para estar disponibles para asuntos urgentes fuera del horario de [place], no estamos disponibles las 24 horas del da, los 7 das de la [place].   Si tiene un problema urgente y no puede comunicarse con nosotros, puede optar por buscar atencin mdica  en el consultorio de su doctor(a), en una clnica privada, en un centro de atencin urgente o en una sala de emergencias.  Si tiene Engineering geologist, por favor llame inmediatamente al 911 o vaya a la sala de emergencias.  Nmeros de bper  - Dr. Nehemiah Massed: [phone]  - Dra. Moye: [phone]  - Dra. Nicole Kindred: [phone]  En caso de inclemencias del [place], por favor llame a Johnsie Kindred principal al [phone] para una actualizacin sobre el [place] de cualquier retraso o cierre.  Consejos para la medicacin en dermatologa: Por favor, guarde las cajas en las que vienen los  medicamentos de uso tpico para ayudarle a seguir las instrucciones sobre dnde y cmo usarlos. Las farmacias generalmente imprimen las instrucciones del medicamento slo en las cajas y no directamente en los tubos del [place].   Si su medicamento es muy caro, por favor, pngase en contacto con Zigmund Daniel llamando al [phone] y presione la opcin 4 o envenos un mensaje a travs de Pharmacist, community.   No podemos decirle cul ser su copago por los medicamentos por adelantado ya que esto es diferente dependiendo de la cobertura de su seguro. Sin embargo, es posible que  podamos encontrar un medicamento sustituto a Electrical engineer un formulario para que el seguro cubra el medicamento que se considera necesario.   Si se requiere una autorizacin previa para que su compaa de seguros [place] su medicamento, por favor permtanos de 1 a 2 das hbiles para completar este proceso.  Los precios de los medicamentos varan con frecuencia dependiendo del Environmental consultant de dnde se surte la receta y alguna farmacias pueden ofrecer precios ms baratos.  El sitio web www.goodrx.com tiene cupones para medicamentos de Airline pilot. Los precios aqu no tienen en cuenta lo que podra costar con la ayuda del seguro (puede ser ms barato con su seguro), pero el sitio web puede darle el precio si no utiliz Research scientist (physical sciences).  - Puede imprimir el cupn correspondiente y llevarlo con su receta a la farmacia.  - Tambin puede pasar por nuestra oficina durante el horario de atencin regular y Charity fundraiser una tarjeta de cupones de GoodRx.  - Si necesita que su receta se enve electrnicamente a una farmacia diferente, informe a nuestra oficina a travs de MyChart de [place] o por telfono llamando al [phone] y presione la opcin 4.

## 2022-04-23 NOTE — Telephone Encounter (Signed)
Left vm and sent mychart message to confirm 04/30/22 appointment-Toni

## 2022-04-27 ENCOUNTER — Encounter: Payer: Self-pay | Admitting: Dermatology

## 2022-04-29 ENCOUNTER — Telehealth: Payer: Self-pay

## 2022-04-29 NOTE — Telephone Encounter (Signed)
Advised patient of results/hd  

## 2022-04-29 NOTE — Telephone Encounter (Signed)
-----   Message from Ralene Bathe, MD sent at 04/24/2022  5:59 PM EDT ----- Diagnosis 1. 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

## 2022-04-30 ENCOUNTER — Ambulatory Visit (INDEPENDENT_AMBULATORY_CARE_PROVIDER_SITE_OTHER): Payer: PPO | Admitting: Nurse Practitioner

## 2022-04-30 ENCOUNTER — Encounter: Payer: Self-pay | Admitting: Nurse Practitioner

## 2022-04-30 VITALS — BP 121/71 | HR 77 | Temp 97.2°F | Resp 16 | Ht 62.0 in | Wt 107.6 lb

## 2022-04-30 DIAGNOSIS — R7989 Other specified abnormal findings of blood chemistry: Secondary | ICD-10-CM

## 2022-04-30 DIAGNOSIS — E782 Mixed hyperlipidemia: Secondary | ICD-10-CM | POA: Diagnosis not present

## 2022-04-30 DIAGNOSIS — E538 Deficiency of other specified B group vitamins: Secondary | ICD-10-CM | POA: Diagnosis not present

## 2022-04-30 DIAGNOSIS — Z0001 Encounter for general adult medical examination with abnormal findings: Secondary | ICD-10-CM

## 2022-04-30 DIAGNOSIS — E559 Vitamin D deficiency, unspecified: Secondary | ICD-10-CM | POA: Diagnosis not present

## 2022-04-30 DIAGNOSIS — R300 Dysuria: Secondary | ICD-10-CM

## 2022-04-30 DIAGNOSIS — Z760 Encounter for issue of repeat prescription: Secondary | ICD-10-CM

## 2022-04-30 DIAGNOSIS — M810 Age-related osteoporosis without current pathological fracture: Secondary | ICD-10-CM | POA: Diagnosis not present

## 2022-04-30 DIAGNOSIS — I10 Essential (primary) hypertension: Secondary | ICD-10-CM

## 2022-04-30 DIAGNOSIS — N1832 Chronic kidney disease, stage 3b: Secondary | ICD-10-CM | POA: Diagnosis not present

## 2022-04-30 DIAGNOSIS — E785 Hyperlipidemia, unspecified: Secondary | ICD-10-CM | POA: Diagnosis not present

## 2022-04-30 MED ORDER — SIMVASTATIN 20 MG PO TABS: 20.0000 mg | ORAL_TABLET | Freq: Every day | ORAL | 3 refills | Status: DC

## 2022-04-30 MED ORDER — BISOPROLOL-HYDROCHLOROTHIAZIDE 2.5-6.25 MG PO TABS: 1.0000 | ORAL_TABLET | Freq: Every morning | ORAL | 3 refills | Status: DC

## 2022-04-30 MED ORDER — CYANOCOBALAMIN 1000 MCG/ML IJ SOLN
1000.0000 ug | Freq: Once | INTRAMUSCULAR | Status: AC
  Administered 2022-04-30: 1000 ug via INTRAMUSCULAR

## 2022-04-30 MED ORDER — AMLODIPINE BESYLATE 5 MG PO TABS: 5.0000 mg | ORAL_TABLET | Freq: Every day | ORAL | 3 refills | Status: DC

## 2022-04-30 NOTE — Progress Notes (Signed)
[facility] [place], [place] [address]  Internal MEDICINE  Office Visit Note  Patient Name: Joann Warren  [id number]  [id number]  Date of Service: 04/30/2022  Chief Complaint  Patient presents with   Medicare Wellness   Hyperlipidemia   Hypertension    HPI Roshini presents for an annual well visit and physical exam.  --well appearing 76 year old female with  anemia, glaucoma, osteoporosis, hyperlipidemia, hypertension, and many squamous cell and basal cell carcinoma lesioms that have been removed.  --she has only had a few more skin lesions removed since last year and has also had radiation done on her legs.  --her mammogram is scheduled for October.  --no significant changes in her health or daily life since last year's AWV. BP and other vital signs are normal. She denies any new or worsening pains.     Current Medication: Outpatient Encounter Medications as of 04/30/2022  Medication Sig   alendronate (FOSAMAX) 70 MG tablet TAKE 1 TABLET EVERY 7 DAYS WITH A FULL GLASS OF WATER ON AN EMPTY STOMACH DO NOT LIE DOWN FOR AT LEAST 30 MIN   bimatoprost (LUMIGAN) 0.03 % ophthalmic solution    doxycycline (VIBRA-TABS) 100 MG tablet Take one tab po BID with food x 1 week.   hydrOXYzine (ATARAX) 10 MG tablet TAKE ONE TABLET BY MOUTH ONCE TO TWICE DAILY AT NIGHT AS NEEDED FOR ITCHING   mupirocin ointment (BACTROBAN) 2 % Apply to skin qd-bid   mupirocin ointment (BACTROBAN) 2 % Apply topically daily.   triamcinolone (KENALOG) 0.1 % APPLY ONCE DAILY AS NEEDED   Zoster Vaccine Adjuvanted [facility]) injection Inject 0.5 mLs into the muscle once.   [DISCONTINUED] amLODipine (NORVASC) 5 MG tablet TAKE 1 TABLET BY MOUTH DAILY   [DISCONTINUED] bisoprolol-hydrochlorothiazide (ZIAC) 2.5-6.25 MG tablet TAKE 1 TABLET BY MOUTH EVERY MORNING   [DISCONTINUED] simvastatin (ZOCOR) 20 MG tablet TAKE 1 TABLET AT BEDTIME   amLODipine (NORVASC) 5 MG tablet Take 1 tablet (5 mg total)  by mouth daily.   bisoprolol-hydrochlorothiazide (ZIAC) 2.5-6.25 MG tablet Take 1 tablet by mouth every morning.   simvastatin (ZOCOR) 20 MG tablet Take 1 tablet (20 mg total) by mouth at bedtime.   [EXPIRED] cyanocobalamin (VITAMIN B12) injection 1,000 mcg    No facility-administered encounter medications on file as of 04/30/2022.    Surgical History: Past Surgical History:  Procedure Laterality Date   BREAST EXCISIONAL BIOPSY Left 1972   neg   BREAST EXCISIONAL BIOPSY Right 1980   neg   COLONOSCOPY     ESOPHAGOGASTRODUODENOSCOPY (EGD) WITH PROPOFOL N/A 04/09/2015   Procedure: ESOPHAGOGASTRODUODENOSCOPY (EGD) WITH PROPOFOL;  Surgeon: Manya Silvas, MD;  Location: [facility];  Service: Endoscopy;  Laterality: N/A;   skin cancer removal      Medical History: Past Medical History:  Diagnosis Date   Actinic keratosis    Anemia    Basal cell carcinoma 12/23/2007   Upper back.   Basal cell carcinoma 05/17/2009   Right med lower leg   Basal cell carcinoma 07/06/2014   Left medial breast   Basal cell carcinoma 03/01/2015   Right medial mid pretibial   Basal cell carcinoma 07/16/2015   Right superior breast   Basal cell carcinoma 05/21/2016   Left inferior medial breast   Basal cell carcinoma 02/08/2018   Left medial breast   Basal cell carcinoma 11/01/2018   Left medial breast   Basal cell carcinoma 03/30/2019   Left med inf breast   Basal  cell carcinoma 09/05/2019   Left proximal bicep   Basal cell carcinoma 12/08/2019   Right forehead. Nodular pattern.   Basal cell carcinoma 12/13/2019   Left med. breast inf. Nodular and infiltrative patterns. EDC   Basal cell carcinoma 12/13/2019   Left med. breast. sup. Nodular pattern. EDC   Basal cell carcinoma 05/14/2020   Right upper back. BCC with focal sclerosis. EDC.   Basal cell carcinoma 12/25/2020   Left inf med breast EDC   Basal cell carcinoma 07/03/2021   Right bicep EDC   Cancer ([place])    squamous cell    Glaucoma    Hyperlipidemia    Hypertension    Osteoporosis    SCC (squamous cell carcinoma) 12/16/2021   R forearm, EDC   SCC (squamous cell carcinoma) 12/16/2021   L pretibial, EDC   SCC (squamous cell carcinoma) 04/23/2022   Left mid distal pretibial lat - EDC   SCC (squamous cell carcinoma) 04/23/2022   Left mid distal pretibial med - EDC   Squamous cell carcinoma of skin 12/08/2007   Left lower leg. WD SCC   Squamous cell carcinoma of skin 02/01/2008   Left distal med. pretibial. WD SCC   Squamous cell carcinoma of skin 02/01/2008   Right mid pretibial. WD SCC   Squamous cell carcinoma of skin 02/01/2008   Right mid pretibal inferior. WD SCC with superficial infiltration.   Squamous cell carcinoma of skin 04/05/2008   Left dorsum hand. WD SCC with superficial infiltration.   Squamous cell carcinoma of skin 06/15/2008   Right lower leg inferior. WD SCC   Squamous cell carcinoma of skin 06/15/2008   Right lower leg superior. SCCis hypertrophic   Squamous cell carcinoma of skin 07/05/2008   Right lat. lower leg, Right lower leg, Left lower leg   Squamous cell carcinoma of skin 08/04/2008   Right ant. lower leg, Left med. lower leg   Squamous cell carcinoma of skin 09/01/2008   Right post. lower leg   Squamous cell carcinoma of skin 10/04/2008   Left post leg   Squamous cell carcinoma of skin 12/01/2008   Left lower leg   Squamous cell carcinoma of skin 03/08/2009   Right ant. mid lower leg, Right lower leg   Squamous cell carcinoma of skin 05/17/2009   Left lower post. leg superior   Squamous cell carcinoma of skin 10/11/2009   Left lower leg. KA-pattern   Squamous cell carcinoma of skin 03/28/2010   Right lower leg   Squamous cell carcinoma of skin 03/25/2013   Right lower leg   Squamous cell carcinoma of skin 06/23/2013   Right post. lower leg. Right medial lower leg. Left medial lower leg   Squamous cell carcinoma of skin 09/09/2013   Left anterior upper arm.  Right anterior lower leg. Right lat. lower leg. Right below knee lower leg   Squamous cell carcinoma of skin 12/08/2013   Right posterior lower leg sup. Right forearm   Squamous cell carcinoma of skin 03/03/2014   Right posterior leg. Right med. lower leg   Squamous cell carcinoma of skin 06/22/2014   Right to of shoulder anterior. Right top of shoulder posterior   Squamous cell carcinoma of skin 08/03/2014   Right anterior forearm   Squamous cell carcinoma of skin 09/14/2014   Right chest   Squamous cell carcinoma of skin 10/30/2014   Right calf   Squamous cell carcinoma of skin 12/14/2014   Right med. distal pretibial sup. Right med distal pretibial  inf. Left proximal bicep sup. Left proximal bicep inf.   Squamous cell carcinoma of skin 03/01/2015   Left lat proximal pretibial near knee   Squamous cell carcinoma of skin 03/29/2015   Left mid to prox. pretibial. Left mid. lat. pretibial.    Squamous cell carcinoma of skin 04/19/2015   Right mid calf. Right distal lat. pretibial   Squamous cell carcinoma of skin 05/31/2015   Right proximal med. pretibial ant. Right proximal med. pretibial posterior   Squamous cell carcinoma of skin 07/16/2015   Right upper arm inf. deltoid   Squamous cell carcinoma of skin 08/06/2015   Left distal pretibial   Squamous cell carcinoma of skin 09/12/2015   Left postauricular area   Squamous cell carcinoma of skin 12/12/2015   Left proximal pretibial x4 sites   Squamous cell carcinoma of skin 12/31/2015   Right med. lower leg above ankle sup. Right med. lower leg above ankle inf. Left inf. lat. calf med. Left inf. calf lat.   Squamous cell carcinoma of skin 01/31/2016   Right med. sup. ankle area. Right med mid proximal calf. Left dorsum foot.    Squamous cell carcinoma of skin 03/17/2016   Left post. distal calf lateral. Left post distal calf med. Left mid lat ant. thigh. Right mid lat. calf   Squamous cell carcinoma of skin 05/21/2016   Right  proximal medial pretibial. Right mid med. pretibial.   Squamous cell carcinoma of skin 07/03/2016   Right mid med. pretibial. Right proximal med. pretibial   Squamous cell carcinoma of skin 08/20/2016   Right medial calf. Left distal pretibial. Right upper back paraspinal   Squamous cell carcinoma of skin 10/30/2016   Right medial calf x2   Squamous cell carcinoma of skin 12/10/2016   left mid back 6.0cm lat to spine. Left sup. lat. calf. Left inf. post. calf   Squamous cell carcinoma of skin 12/25/2016   Right medial above ankle. Right mid pretibial. Right prox. pretibial. Right prox. pretibial med.    Squamous cell carcinoma of skin 01/19/2017   Left forearm. Left ant. neck   Squamous cell carcinoma of skin 03/23/2017   Left med. pretibial below knee   Squamous cell carcinoma of skin 04/13/2017   Right lat. sup. ankle. Right medial ankle   Squamous cell carcinoma of skin 04/27/2017   Left med. distal prox. calf. Left medial distal calf. Left lat. distal prox. calf. Left lat distal distal calf   Squamous cell carcinoma of skin 05/14/2017   Left bicep. Left dorsum mid forearm   Squamous cell carcinoma of skin 06/25/2017   Right med. distal calf x3   Squamous cell carcinoma of skin 09/02/2017   Left med. infraclavicular   Squamous cell carcinoma of skin 12/21/2017   Left prox. med. lower leg. Right forearm. Left forearm. Left bicep   Squamous cell carcinoma of skin 01/07/2018   Left dorsum foot prox. Left dorsum foot distal. Left mid to distal calf. Right chest   Squamous cell carcinoma of skin 03/08/2018   Left bicep. Right proximal dorsum forearm. Right distal med. pretibial sup. Right distal med pretibial inf.    Squamous cell carcinoma of skin 04/05/2018   Right sup. med scapula   Squamous cell carcinoma of skin 06/03/2018   Left med. ankle sup. Left med. ankle inf. Left lateral ankle.   Squamous cell carcinoma of skin 09/01/2018   Right mid lat. calf. Right lat. knee. Prox  post calf   Squamous cell carcinoma of  skin 11/01/2018   Left lateral ankle   Squamous cell carcinoma of skin 12/08/2018   Right lateral ankle lat malleolus   Squamous cell carcinoma of skin 01/05/2019   Right lateral elbow. Left mid medial calf. Left prox. ant. thigh. Right distal lat tricep   Squamous cell carcinoma of skin 01/19/2019   Right popliteal   Squamous cell carcinoma of skin 02/03/2019   Left mid lat calf. Left mid calf. Left dorsum lat distal foot. Left dorsum distal foot   Squamous cell carcinoma of skin 03/30/2019   Right lower leg above med ankle ant. Right lower leg above ankle sup. Right lower leg above the med ankle inf.   Squamous cell carcinoma of skin 05/12/2019   Right chest medial. Left deltoid   Squamous cell carcinoma of skin 06/23/2019   Right ant thigh distal above knee. Left med distal thigh. Left ant thigh above knee   Squamous cell carcinoma of skin 07/20/2019   Right chest. Right dorsal hand.   Squamous cell carcinoma of skin 08/03/2019   Left distal lat pretibial. Left distal med calf. Right prox. lat calf   Squamous cell carcinoma of skin 09/05/2019   Right middle bicep. Right sup lat bicep. Right sup mid bicep. Right sup med bicep   Squamous cell carcinoma of skin 09/15/2019   Right proximal med pretibial. Left distal lat thigh   Squamous cell carcinoma of skin 04/02/2020   Right calf, bleow right medial knee sup, below right medial knee post, below right medial knee inf   Squamous cell carcinoma of skin 05/14/2020   Right ant. shoulder. Mod. Diff. SCC. EDC   Squamous cell carcinoma of skin 06/27/2020   R mid to distal pretibial - ED&C   Squamous cell carcinoma of skin 06/27/2020   Below the R knee - ED&C    Squamous cell carcinoma of skin 06/27/2020   R prox pretibial    Squamous cell carcinoma of skin 06/27/2020   L distal med calf   Squamous cell carcinoma of skin 08/06/2020   Left ant deltoid   Squamous cell carcinoma of skin 12/25/2020    Left lat ankle EDC   Squamous cell carcinoma of skin 03/27/2021   left knee, EDC   Squamous cell carcinoma of skin 03/27/2021   left lateral leg, EDC   Squamous cell carcinoma of skin 07/03/2021   Left lat neck - EDC   Squamous cell carcinoma of skin 07/03/2021   Right ant thigh - EDC   Squamous cell carcinoma of skin 07/03/2021   Right prox lat pretibial - EDC   Squamous cell carcinoma of skin 10/16/2021   right med mid calf, EDC   Squamous cell carcinoma of skin 10/16/2021   left medial prox pretibial, EDC   Squamous cell carcinoma of skin 10/16/2021   left distal pretibial, EDC    Family History: Family History  Problem Relation Age of Onset   Breast cancer Sister 11   Atrial fibrillation Mother     Social History   Socioeconomic History   Marital status: Married    Spouse name: Not on file   Number of children: Not on file   Years of education: Not on file   Highest education level: Not on file  Occupational History   Not on file  Tobacco Use   Smoking status: Never   Smokeless tobacco: Never  Vaping Use   Vaping Use: Never used  Substance and Sexual Activity   Alcohol use: No  Drug use: No   Sexual activity: Not on file  Other Topics Concern   Not on file  Social History Narrative   Not on file   Social Determinants of Health   Financial Resource Strain: Not on file  Food Insecurity: Not on file  Transportation Needs: Not on file  Physical Activity: Not on file  Stress: Not on file  Social Connections: Not on file  Intimate Partner Violence: Not on file      Review of Systems  Constitutional:  Negative for activity change, appetite change, chills, fatigue, fever and unexpected weight change.  HENT: Negative.  Negative for congestion, ear pain, rhinorrhea, sore throat and trouble swallowing.   Eyes: Negative.   Respiratory: Negative.  Negative for cough, chest tightness, shortness of breath and wheezing.   Cardiovascular: Negative.  Negative  for chest pain.  Gastrointestinal: Negative.  Negative for abdominal pain, blood in stool, constipation, diarrhea, nausea and vomiting.  Endocrine: Negative.   Genitourinary: Negative.  Negative for difficulty urinating, dysuria, frequency, hematuria and urgency.  Musculoskeletal: Negative.  Negative for arthralgias, back pain, joint swelling, myalgias and neck pain.  Skin: Negative.  Negative for rash and wound.  Allergic/Immunologic: Negative.  Negative for immunocompromised state.  Neurological: Negative.  Negative for dizziness, seizures, numbness and headaches.  Hematological: Negative.   Psychiatric/Behavioral: Negative.  Negative for behavioral problems, self-injury and suicidal ideas. The patient is not nervous/anxious.     Vital Signs: BP 121/71   Pulse 77   Temp (!) 97.2 F (36.2 C)   Resp 16   Ht 5' 2"  (1.575 m)   Wt 107 lb 9.6 oz (48.8 kg)   SpO2 99%   BMI 19.68 kg/m    Physical Exam Constitutional:      General: She is awake. She is not in acute distress.    Appearance: Normal appearance. She is well-developed, well-groomed and normal weight. She is not ill-appearing or diaphoretic.  HENT:     Head: Normocephalic and atraumatic.     Right Ear: Tympanic membrane, ear canal and external ear normal.     Left Ear: Tympanic membrane, ear canal and external ear normal.     Nose: Nose normal. No congestion or rhinorrhea.     Mouth/Throat:     Lips: Pink.     Mouth: Mucous membranes are moist.     Pharynx: Oropharynx is clear. Uvula midline. No oropharyngeal exudate or posterior oropharyngeal erythema.  Eyes:     General: Lids are normal. Vision grossly intact. Gaze aligned appropriately. No scleral icterus.       Right eye: No discharge.        Left eye: No discharge.     Extraocular Movements: Extraocular movements intact.     Conjunctiva/sclera: Conjunctivae normal.     Pupils: Pupils are equal, round, and reactive to light.     Funduscopic exam:    Right eye:  Red reflex present.        Left eye: Red reflex present. Neck:     Thyroid: No thyromegaly.     Vascular: No JVD.     Trachea: Trachea and phonation normal. No tracheal deviation.  Cardiovascular:     Rate and Rhythm: Normal rate and regular rhythm.     Pulses: Normal pulses.     Heart sounds: Normal heart sounds, S1 normal and S2 normal. No murmur heard.    No friction rub. No gallop.  Pulmonary:     Effort: Pulmonary effort is normal. No accessory  muscle usage or respiratory distress.     Breath sounds: Normal breath sounds. No stridor. No wheezing or rales.  Chest:     Chest wall: No tenderness.  Breasts:    Breasts are symmetrical.     Right: Normal. No swelling, bleeding, inverted nipple, mass, nipple discharge, skin change or tenderness.     Left: Normal. No swelling, bleeding, inverted nipple, mass, nipple discharge, skin change or tenderness.  Abdominal:     General: Bowel sounds are normal. There is no distension.     Palpations: Abdomen is soft. There is no mass.     Tenderness: There is no abdominal tenderness. There is no guarding or rebound.  Musculoskeletal:        General: No tenderness or deformity. Normal range of motion.     Cervical back: Normal range of motion and neck supple.     Right lower leg: No edema.     Left lower leg: No edema.  Lymphadenopathy:     Cervical: No cervical adenopathy.     Upper Body:     Right upper body: No supraclavicular, axillary or pectoral adenopathy.     Left upper body: No supraclavicular, axillary or pectoral adenopathy.  Skin:    General: Skin is warm and dry.     Capillary Refill: Capillary refill takes less than 2 seconds.     Coloration: Skin is not pale.     Findings: No erythema or rash.  Neurological:     Mental Status: She is alert and oriented to person, place, and time.     Cranial Nerves: No cranial nerve deficit.     Motor: No abnormal muscle tone.     Coordination: Coordination normal.     Gait: Gait normal.      Deep Tendon Reflexes: Reflexes are normal and symmetric.  Psychiatric:        Mood and Affect: Mood and affect normal.        Behavior: Behavior normal. Behavior is cooperative.        Thought Content: Thought content normal.        Judgment: Judgment normal.        Assessment/Plan: 1. Encounter for general adult medical examination with abnormal findings Age-appropriate preventive screenings and vaccinations discussed, annual physical exam completed. Routine labs for health maintenance ordered, see below. PHM updated.   2. Chronic kidney disease (CKD) stage G3b/A1, moderately decreased glomerular filtration rate (GFR) between 30-44 mL/min/1.73 square meter and albuminuria creatinine ratio less than 30 mg/g (HCC) Routine labs ordered to monitor kidney function - CBC with Differential/Platelet - CMP14+EGFR - Lipid Profile - TSH + free T4  3. Mixed hyperlipidemia Patint on statin therapy, labs ordered, continue simvastatin as prescribed.  - CBC with Differential/Platelet - CMP14+EGFR - Lipid Profile  4. Elevated ferritin level Routine labs ordered, iron panel ordered to eval for high ferritin - CBC with Differential/Platelet - CMP14+EGFR - TSH + free T4 - Iron, TIBC and Ferritin Panel - B12 and Folate Panel  5. B12 deficiency B12  injection administered in office, routine labs ordered - cyanocobalamin (VITAMIN B12) injection 1,000 mcg - Iron, TIBC and Ferritin Panel - B12 and Folate Panel  6. Vitamin D deficiency Routine lab ordered - Vitamin D (25 hydroxy)  8. Dysuria Routine urinalysis - UA/M w/rflx Culture, Routine  9. Medication refill - simvastatin (ZOCOR) 20 MG tablet; Take 1 tablet (20 mg total) by mouth at bedtime.  Dispense: 90 tablet; Refill: 3 - bisoprolol-hydrochlorothiazide (ZIAC) 2.5-6.25 MG  tablet; Take 1 tablet by mouth every morning.  Dispense: 90 tablet; Refill: 3 - amLODipine (NORVASC) 5 MG tablet; Take 1 tablet (5 mg total) by mouth daily.   Dispense: 90 tablet; Refill: 3   General Counseling: AmeLie verbalizes understanding of the findings of todays visit and agrees with plan of treatment. I have discussed any further diagnostic evaluation that may be needed or ordered today. We also reviewed her medications today. she has been encouraged to call the office with any questions or concerns that should arise related to todays visit.    Orders Placed This Encounter  Procedures   UA/M w/rflx Culture, Routine   CBC with Differential/Platelet   CMP14+EGFR   Lipid Profile   TSH + free T4   Vitamin D (25 hydroxy)   Iron, TIBC and Ferritin Panel   B12 and Folate Panel    Meds ordered this encounter  Medications   cyanocobalamin (VITAMIN B12) injection 1,000 mcg   simvastatin (ZOCOR) 20 MG tablet    Sig: Take 1 tablet (20 mg total) by mouth at bedtime.    Dispense:  90 tablet    Refill:  3   bisoprolol-hydrochlorothiazide (ZIAC) 2.5-6.25 MG tablet    Sig: Take 1 tablet by mouth every morning.    Dispense:  90 tablet    Refill:  3    For future refills   amLODipine (NORVASC) 5 MG tablet    Sig: Take 1 tablet (5 mg total) by mouth daily.    Dispense:  90 tablet    Refill:  3    Return in about 1 year (around 05/01/2023) for CPE, Jesseca Marsch PCP.   Total time spent:30 Minutes Time spent includes review of chart, medications, test results, and follow up plan with the patient.   [place] Controlled Substance Database was reviewed by me.  This patient was seen by Jonetta Osgood, FNP-C in collaboration with Dr. Clayborn Bigness as a part of collaborative care agreement.  Jody Silas R. Valetta Fuller, MSN, FNP-C Internal medicine

## 2022-05-01 LAB — CBC WITH DIFFERENTIAL/PLATELET
Basophils Absolute: 0 10*3/uL (ref 0.0–0.2)
Basos: 1 %
EOS (ABSOLUTE): 0.1 10*3/uL (ref 0.0–0.4)
Eos: 2 %
Hematocrit: 37.1 % (ref 34.0–46.6)
Hemoglobin: 12.3 g/dL (ref 11.1–15.9)
Immature Grans (Abs): 0 10*3/uL (ref 0.0–0.1)
Immature Granulocytes: 0 %
Lymphocytes Absolute: 2.1 10*3/uL (ref 0.7–3.1)
Lymphs: 27 %
MCH: 29.1 pg (ref 26.6–33.0)
MCHC: 33.2 g/dL (ref 31.5–35.7)
MCV: 88 fL (ref 79–97)
Monocytes Absolute: 0.5 10*3/uL (ref 0.1–0.9)
Monocytes: 6 %
Neutrophils Absolute: 5.1 10*3/uL (ref 1.4–7.0)
Neutrophils: 64 %
Platelets: 229 10*3/uL (ref 150–450)
RBC: 4.23 x10E6/uL (ref 3.77–5.28)
RDW: 12.8 % (ref 11.7–15.4)
WBC: 8 10*3/uL (ref 3.4–10.8)

## 2022-05-01 LAB — LIPID PANEL
Chol/HDL Ratio: 2.6 ratio (ref 0.0–4.4)
Cholesterol, Total: 207 mg/dL — ABNORMAL HIGH (ref 100–199)
HDL: 80 mg/dL (ref >39)
LDL Chol Calc (NIH): 114 mg/dL — ABNORMAL HIGH (ref 0–99)
Triglycerides: 70 mg/dL (ref 0–149)
VLDL Cholesterol Cal: 13 mg/dL (ref 5–40)

## 2022-05-01 LAB — VITAMIN D 25 HYDROXY (VIT D DEFICIENCY, FRACTURES): Vit D, 25-Hydroxy: 66.6 ng/mL (ref 30.0–100.0)

## 2022-05-01 LAB — CMP14+EGFR
ALT: 8 IU/L (ref 0–32)
AST: 15 IU/L (ref 0–40)
Albumin/Globulin Ratio: 1.9 (ref 1.2–2.2)
Albumin: 4.7 g/dL (ref 3.8–4.8)
Alkaline Phosphatase: 67 IU/L (ref 44–121)
BUN/Creatinine Ratio: 18 (ref 12–28)
BUN: 18 mg/dL (ref 8–27)
Bilirubin Total: 0.5 mg/dL (ref 0.0–1.2)
CO2: 26 mmol/L (ref 20–29)
Calcium: 9.6 mg/dL (ref 8.7–10.3)
Chloride: 101 mmol/L (ref 96–106)
Creatinine, Ser: 1 mg/dL (ref 0.57–1.00)
Globulin, Total: 2.5 g/dL (ref 1.5–4.5)
Glucose: 101 mg/dL — ABNORMAL HIGH (ref 70–99)
Potassium: 4.6 mmol/L (ref 3.5–5.2)
Sodium: 142 mmol/L (ref 134–144)
Total Protein: 7.2 g/dL (ref 6.0–8.5)
eGFR: 58 mL/min/{1.73_m2} — ABNORMAL LOW (ref >59)

## 2022-05-01 LAB — IRON,TIBC AND FERRITIN PANEL
Ferritin: 112 ng/mL (ref 15–150)
Iron Saturation: 19 % (ref 15–55)
Iron: 55 ug/dL (ref 27–139)
Total Iron Binding Capacity: 287 ug/dL (ref 250–450)
UIBC: 232 ug/dL (ref 118–369)

## 2022-05-01 LAB — TSH+FREE T4
Free T4: 1.19 ng/dL (ref 0.82–1.77)
TSH: 3.27 u[IU]/mL (ref 0.450–4.500)

## 2022-05-01 LAB — B12 AND FOLATE PANEL
Folate: 5.4 ng/mL (ref >3.0)
Vitamin B-12: >2000 pg/mL — ABNORMAL HIGH (ref 232–1245)

## 2022-05-04 LAB — UA/M W/RFLX CULTURE, ROUTINE
Bilirubin, UA: NEGATIVE
Glucose, UA: NEGATIVE
Ketones, UA: NEGATIVE
Nitrite, UA: NEGATIVE
Protein,UA: NEGATIVE
RBC, UA: NEGATIVE
Specific Gravity, UA: 1.011 (ref 1.005–1.030)
Urobilinogen, Ur: 0.2 mg/dL (ref 0.2–1.0)
pH, UA: 5.5 (ref 5.0–7.5)

## 2022-05-04 LAB — MICROSCOPIC EXAMINATION
Bacteria, UA: NONE SEEN
Casts: NONE SEEN /lpf
Epithelial Cells (non renal): NONE SEEN /hpf (ref 0–10)
RBC, Urine: NONE SEEN /hpf (ref 0–2)

## 2022-05-04 LAB — URINE CULTURE, REFLEX

## 2022-05-06 ENCOUNTER — Ambulatory Visit: Payer: Self-pay

## 2022-05-08 ENCOUNTER — Other Ambulatory Visit: Payer: Self-pay | Admitting: Dermatology

## 2022-05-08 DIAGNOSIS — L299 Pruritus, unspecified: Secondary | ICD-10-CM

## 2022-05-10 ENCOUNTER — Encounter: Payer: Self-pay | Admitting: Nurse Practitioner

## 2022-06-03 ENCOUNTER — Ambulatory Visit (INDEPENDENT_AMBULATORY_CARE_PROVIDER_SITE_OTHER): Payer: PPO

## 2022-06-03 DIAGNOSIS — E538 Deficiency of other specified B group vitamins: Secondary | ICD-10-CM | POA: Diagnosis not present

## 2022-06-03 MED ORDER — CYANOCOBALAMIN 1000 MCG/ML IJ SOLN
1000.0000 ug | Freq: Once | INTRAMUSCULAR | Status: AC
  Administered 2022-06-03: 1000 ug via INTRAMUSCULAR

## 2022-06-10 ENCOUNTER — Ambulatory Visit: Payer: PPO | Admitting: Dermatology

## 2022-06-10 DIAGNOSIS — C44519 Basal cell carcinoma of skin of other part of trunk: Secondary | ICD-10-CM | POA: Diagnosis not present

## 2022-06-10 DIAGNOSIS — C44511 Basal cell carcinoma of skin of breast: Secondary | ICD-10-CM | POA: Diagnosis not present

## 2022-06-10 DIAGNOSIS — D492 Neoplasm of unspecified behavior of bone, soft tissue, and skin: Secondary | ICD-10-CM

## 2022-06-10 MED ORDER — MUPIROCIN 2 % EX OINT: 1.0000 | TOPICAL_OINTMENT | Freq: Every day | CUTANEOUS | 6 refills | Status: DC

## 2022-06-10 NOTE — Patient Instructions (Signed)

## 2022-06-10 NOTE — Progress Notes (Signed)
   Follow-Up Visit   Subjective  Joann Warren is a 76 y.o. female who presents for the following: Recurrent BCC vs other (L inf medial breast, pt presents for excision today).  The following portions of the chart were reviewed this encounter and updated as appropriate:   Tobacco  Allergies  Meds  Problems  Med Hx  Surg Hx  Fam Hx     Review of Systems:  No other skin or systemic complaints except as noted in HPI or Assessment and Plan.  Objective  Well appearing patient in no apparent distress; mood and affect are within normal limits.  A focused examination was performed including chest. Relevant physical exam findings are noted in the Assessment and Plan.  L inf medial breast Firm pap 1.1 x 1.1cm   Assessment & Plan  Neoplasm of skin L inf medial breast  Skin excision  Lesion length (cm):  1.1 Lesion width (cm):  1.1 Margin per side (cm):  0.2 Total excision diameter (cm):  1.5 Informed consent: discussed and consent obtained   Timeout: patient name, date of birth, surgical site, and procedure verified   Procedure prep:  Patient was prepped and draped in usual sterile fashion Prep type:  Isopropyl alcohol and povidone-iodine Anesthesia: the lesion was anesthetized in a standard fashion   Anesthetic:  1% lidocaine w/ epinephrine 1-100,000 buffered w/ 8.4% NaHCO3 Instrument used comment:  #15c blade Hemostasis achieved with: pressure   Hemostasis achieved with comment:  Electrocautery Outcome: patient tolerated procedure well with no complications   Post-procedure details: sterile dressing applied and wound care instructions given   Dressing type: bandage, pressure dressing and bacitracin (Mupirocin)    Skin repair Complexity:  Complex Final length (cm):  5 Reason for type of repair: reduce tension to allow closure, reduce the risk of dehiscence, infection, and necrosis, reduce subcutaneous dead space and avoid a hematoma, allow closure of the large defect,  preserve normal anatomy, preserve normal anatomical and functional relationships and enhance both functionality and cosmetic results   Undermining: area extensively undermined   Undermining comment:  Undermining Defect 1.5cm Subcutaneous layers (deep stitches):  Suture size:  3-0 Suture type: Vicryl (polyglactin 910)   Subcutaneous suture technique: Inverted Dermal. Fine/surface layer approximation (top stitches):  Suture size:  3-0 Suture type: nylon   Stitches: simple running   Suture removal (days):  7 Hemostasis achieved with: pressure Outcome: patient tolerated procedure well with no complications   Post-procedure details: sterile dressing applied and wound care instructions given   Dressing type: bandage, pressure dressing and bacitracin (Mupirocin)    mupirocin ointment (BACTROBAN) 2 % Apply 1 Application topically daily. Qd to excision site  Specimen 1 - Surgical pathology Differential Diagnosis: Recurrent BCC vs other  Check Margins: yes Firm pap 1.1 x 1.1cm [id number]  Recurrent BCC vs other, excised today Start Mupirocin oint qd to excision site  Basal cell carcinoma (BCC) of anterior chest   Return in about 1 week (around 06/17/2022) for suture removal.  I, Othelia Pulling, RMA, am acting as scribe for Sarina Ser, MD . Documentation: I have reviewed the above documentation for accuracy and completeness, and I agree with the above.  Sarina Ser, MD

## 2022-06-11 ENCOUNTER — Telehealth: Payer: Self-pay

## 2022-06-11 NOTE — Telephone Encounter (Signed)
Pt doing fine after yesterdays surgery./sh 

## 2022-06-16 ENCOUNTER — Encounter: Payer: Self-pay | Admitting: Dermatology

## 2022-06-17 ENCOUNTER — Telehealth: Payer: Self-pay

## 2022-06-17 ENCOUNTER — Encounter: Payer: Self-pay | Admitting: Dermatology

## 2022-06-17 ENCOUNTER — Ambulatory Visit (INDEPENDENT_AMBULATORY_CARE_PROVIDER_SITE_OTHER): Payer: PPO | Admitting: Dermatology

## 2022-06-17 DIAGNOSIS — Z4802 Encounter for removal of sutures: Secondary | ICD-10-CM

## 2022-06-17 DIAGNOSIS — Z85828 Personal history of other malignant neoplasm of skin: Secondary | ICD-10-CM

## 2022-06-17 NOTE — Patient Instructions (Signed)
Due to recent changes in healthcare laws, you may see results of your pathology and/or laboratory studies on MyChart before the doctors have had a chance to review them. We understand that in some cases there may be results that are confusing or concerning to you. Please understand that not all results are received at the same time and often the doctors may need to interpret multiple results in order to provide you with the best plan of care or course of treatment. Therefore, we ask that you please give us 2 business days to thoroughly review all your results before contacting the office for clarification. Should we see a critical lab result, you will be contacted sooner.   If You Need Anything After Your Visit  If you have any questions or concerns for your doctor, please call our main line at [phone] and press option 4 to reach your doctor's medical assistant. If no one answers, please leave a voicemail as directed and we will return your call as soon as possible. Messages left after 4 pm will be answered the following business day.   You may also send us a message via MyChart. We typically respond to MyChart messages within 1-2 business days.  For prescription refills, please ask your pharmacy to contact our office. Our fax number is [phone].  If you have an urgent issue when the clinic is closed that cannot wait until the next business day, you can page your doctor at the number below.    Please note that while we do our best to be available for urgent issues outside of office hours, we are not available 24/7.   If you have an urgent issue and are unable to reach us, you may choose to seek medical care at your doctor's office, retail clinic, urgent care center, or emergency room.  If you have a medical emergency, please immediately call 911 or go to the emergency department.  Pager Numbers  - Dr. Kowalski: [phone]  - Dr. Moye: [phone]  - Dr. Stewart:  [phone]  In the event of inclement weather, please call our main line at [phone] for an update on the status of any delays or closures.  Dermatology Medication Tips: Please keep the boxes that topical medications come in in order to help keep track of the instructions about where and how to use these. Pharmacies typically print the medication instructions only on the boxes and not directly on the medication tubes.   If your medication is too expensive, please contact our office at [phone] option 4 or send us a message through MyChart.   We are unable to tell what your co-pay for medications will be in advance as this is different depending on your insurance coverage. However, we may be able to find a substitute medication at lower cost or fill out paperwork to get insurance to cover a needed medication.   If a prior authorization is required to get your medication covered by your insurance company, please allow us 1-2 business days to complete this process.  Drug prices often vary depending on where the prescription is filled and some pharmacies may offer cheaper prices.  The website www.goodrx.com contains coupons for medications through different pharmacies. The prices here do not account for what the cost may be with help from insurance (it may be cheaper with your insurance), but the website can give you the price if you did not use any insurance.  - You can print the associated coupon and take it with   your prescription to the pharmacy.  - You may also stop by our office during regular business hours and pick up a GoodRx coupon card.  - If you need your prescription sent electronically to a different pharmacy, notify our office through [facility] MyChart or by phone at [phone] option 4.     Si Usted Necesita Algo Despus de Su Visita  Tambin puede enviarnos un mensaje a travs de MyChart. Por lo general respondemos a los mensajes de MyChart en el transcurso de 1 a 2  das hbiles.  Para renovar recetas, por favor pida a su farmacia que se ponga en contacto con nuestra oficina. Nuestro nmero de fax es el [phone].  Si tiene un asunto urgente cuando la clnica est cerrada y que no puede esperar hasta el siguiente da hbil, puede llamar/localizar a su doctor(a) al nmero que aparece a continuacin.   Por favor, tenga en cuenta que aunque hacemos todo lo posible para estar disponibles para asuntos urgentes fuera del horario de oficina, no estamos disponibles las 24 horas del da, los 7 das de la semana.   Si tiene un problema urgente y no puede comunicarse con nosotros, puede optar por buscar atencin mdica  en el consultorio de su doctor(a), en una clnica privada, en un centro de atencin urgente o en una sala de emergencias.  Si tiene una emergencia mdica, por favor llame inmediatamente al 911 o vaya a la sala de emergencias.  Nmeros de bper  - Dr. Kowalski: [phone]  - Dra. Moye: [phone]  - Dra. Stewart: [phone]  En caso de inclemencias del tiempo, por favor llame a nuestra lnea principal al [phone] para una actualizacin sobre el estado de cualquier retraso o cierre.  Consejos para la medicacin en dermatologa: Por favor, guarde las cajas en las que vienen los medicamentos de uso tpico para ayudarle a seguir las instrucciones sobre dnde y cmo usarlos. Las farmacias generalmente imprimen las instrucciones del medicamento slo en las cajas y no directamente en los tubos del medicamento.   Si su medicamento es muy caro, por favor, pngase en contacto con nuestra oficina llamando al [phone] y presione la opcin 4 o envenos un mensaje a travs de MyChart.   No podemos decirle cul ser su copago por los medicamentos por adelantado ya que esto es diferente dependiendo de la cobertura de su seguro. Sin embargo, es posible que podamos encontrar un medicamento sustituto a menor costo o llenar un formulario para que el  seguro cubra el medicamento que se considera necesario.   Si se requiere una autorizacin previa para que su compaa de seguros cubra su medicamento, por favor permtanos de 1 a 2 das hbiles para completar este proceso.  Los precios de los medicamentos varan con frecuencia dependiendo del lugar de dnde se surte la receta y alguna farmacias pueden ofrecer precios ms baratos.  El sitio web www.goodrx.com tiene cupones para medicamentos de diferentes farmacias. Los precios aqu no tienen en cuenta lo que podra costar con la ayuda del seguro (puede ser ms barato con su seguro), pero el sitio web puede darle el precio si no utiliz ningn seguro.  - Puede imprimir el cupn correspondiente y llevarlo con su receta a la farmacia.  - Tambin puede pasar por nuestra oficina durante el horario de atencin regular y recoger una tarjeta de cupones de GoodRx.  - Si necesita que su receta se enve electrnicamente a una farmacia diferente, informe a nuestra oficina a travs de MyChart de [facility]   o por telfono llamando al [phone] y presione la opcin 4.  

## 2022-06-17 NOTE — Telephone Encounter (Signed)
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

## 2022-06-17 NOTE — Progress Notes (Unsigned)
   Follow-Up Visit   Subjective  Joann Warren is a 76 y.o. female who presents for the following: Suture / Staple Removal (Left inferior medial breast).  The following portions of the chart were reviewed this encounter and updated as appropriate:  Tobacco  Allergies  Meds  Problems  Med Hx  Surg Hx  Fam Hx     Review of Systems: No other skin or systemic complaints except as noted in HPI or Assessment and Plan.  Objective  Well appearing patient in no apparent distress; mood and affect are within normal limits.  A focused examination was performed including chest. Relevant physical exam findings are noted in the Assessment and Plan.   Assessment & Plan  BCC L infero-medial breast - margins free S/P excision Encounter for Removal of Sutures - Incision site at the Left inferior medial breast is clean, dry and intact - Wound cleansed, sutures removed, wound cleansed and steri strips applied.  - Discussed pathology results showing BCC  - Patient advised to keep steri-strips dry until they fall off. - Scars remodel for a full year. - Once steri-strips fall off, patient can apply over-the-counter silicone scar cream each night to help with scar remodeling if desired. - Patient advised to call with any concerns or if they notice any new or changing lesions.   Return in about 2 months (around 08/17/2022) for TBSE.  I, Emelia Salisbury, CMA, am acting as scribe for Sarina Ser, MD. Documentation: I have reviewed the above documentation for accuracy and completeness, and I agree with the above.  Sarina Ser, MD

## 2022-06-18 ENCOUNTER — Encounter: Payer: Self-pay | Admitting: Dermatology

## 2022-07-08 ENCOUNTER — Ambulatory Visit (INDEPENDENT_AMBULATORY_CARE_PROVIDER_SITE_OTHER): Payer: PPO

## 2022-07-08 DIAGNOSIS — E538 Deficiency of other specified B group vitamins: Secondary | ICD-10-CM

## 2022-07-08 MED ORDER — CYANOCOBALAMIN 1000 MCG/ML IJ SOLN
1000.0000 ug | Freq: Once | INTRAMUSCULAR | Status: AC
  Administered 2022-07-08: 1000 ug via INTRAMUSCULAR

## 2022-07-24 ENCOUNTER — Ambulatory Visit
Admission: RE | Admit: 2022-07-24 | Discharge: 2022-07-24 | Disposition: A | Discharge status: Home / Self Care | Payer: PPO | Source: Ambulatory Visit | Attending: Internal Medicine | Admitting: Internal Medicine

## 2022-07-24 DIAGNOSIS — Z1231 Encounter for screening mammogram for malignant neoplasm of breast: Secondary | ICD-10-CM | POA: Insufficient documentation

## 2022-08-04 DIAGNOSIS — H401112 Primary open-angle glaucoma, right eye, moderate stage: Secondary | ICD-10-CM | POA: Diagnosis not present

## 2022-08-05 ENCOUNTER — Ambulatory Visit (INDEPENDENT_AMBULATORY_CARE_PROVIDER_SITE_OTHER): Payer: PPO

## 2022-08-05 DIAGNOSIS — E538 Deficiency of other specified B group vitamins: Secondary | ICD-10-CM | POA: Diagnosis not present

## 2022-08-05 MED ORDER — CYANOCOBALAMIN 1000 MCG/ML IJ SOLN
1000.0000 ug | Freq: Once | INTRAMUSCULAR | Status: AC
  Administered 2022-08-05: 1000 ug via INTRAMUSCULAR

## 2022-08-27 ENCOUNTER — Other Ambulatory Visit: Payer: Self-pay | Admitting: Nurse Practitioner

## 2022-08-27 DIAGNOSIS — M810 Age-related osteoporosis without current pathological fracture: Secondary | ICD-10-CM

## 2022-08-28 ENCOUNTER — Ambulatory Visit: Payer: PPO | Admitting: Dermatology

## 2022-08-28 DIAGNOSIS — L578 Other skin changes due to chronic exposure to nonionizing radiation: Secondary | ICD-10-CM

## 2022-08-28 DIAGNOSIS — C44729 Squamous cell carcinoma of skin of left lower limb, including hip: Secondary | ICD-10-CM | POA: Diagnosis not present

## 2022-08-28 DIAGNOSIS — D492 Neoplasm of unspecified behavior of bone, soft tissue, and skin: Secondary | ICD-10-CM

## 2022-08-28 DIAGNOSIS — Z85828 Personal history of other malignant neoplasm of skin: Secondary | ICD-10-CM

## 2022-08-28 MED ORDER — MUPIROCIN 2 % EX OINT: 1.0000 | TOPICAL_OINTMENT | Freq: Every day | CUTANEOUS | 11 refills | Status: DC

## 2022-08-28 NOTE — Progress Notes (Signed)
Follow-Up Visit   Subjective  Joann Warren is a 77 y.o. female who presents for the following: check spot (L lower leg, irritating). The patient has spots, moles and lesions to be evaluated, some may be new or changing and the patient has concerns that these could be cancer.  The following portions of the chart were reviewed this encounter and updated as appropriate:   Tobacco  Allergies  Meds  Problems  Med Hx  Surg Hx  Fam Hx     Review of Systems:  No other skin or systemic complaints except as noted in HPI or Assessment and Plan.  Objective  Well appearing patient in no apparent distress; mood and affect are within normal limits.  A focused examination was performed including left lower leg. Relevant physical exam findings are noted in the Assessment and Plan.  L proximal pretibial Hyperkeratotic pap 1.7cm       L distal lat pretibial Hyperkeratotic pap 1.3cm        Assessment & Plan   Actinic Damage - chronic, secondary to cumulative UV radiation exposure/sun exposure over time - diffuse scaly erythematous macules with underlying dyspigmentation - Recommend daily broad spectrum sunscreen SPF 30+ to sun-exposed areas, reapply every 2 hours as needed.  - Recommend staying in the shade or wearing long sleeves, sun glasses (UVA+UVB protection) and wide brim hats (4-inch brim around the entire circumference of the hat). - Call for new or changing lesions.    Neoplasm of skin (2) L proximal pretibial Epidermal / dermal shaving  Lesion diameter (cm):  1.7 Informed consent: discussed and consent obtained   Timeout: patient name, date of birth, surgical site, and procedure verified   Procedure prep:  Patient was prepped and draped in usual sterile fashion Prep type:  Isopropyl alcohol Anesthesia: the lesion was anesthetized in a standard fashion   Anesthetic:  1% lidocaine w/ epinephrine 1-100,000 buffered w/ 8.4% NaHCO3 Instrument used: flexible razor  blade   Hemostasis achieved with: pressure, aluminum chloride and electrodesiccation   Outcome: patient tolerated procedure well   Post-procedure details: sterile dressing applied and wound care instructions given   Dressing type: bandage and petrolatum    Destruction of lesion Complexity: extensive   Destruction method: electrodesiccation and curettage   Informed consent: discussed and consent obtained   Timeout:  patient name, date of birth, surgical site, and procedure verified Procedure prep:  Patient was prepped and draped in usual sterile fashion Prep type:  Isopropyl alcohol Anesthesia: the lesion was anesthetized in a standard fashion   Anesthetic:  1% lidocaine w/ epinephrine 1-100,000 buffered w/ 8.4% NaHCO3 Curettage performed in three different directions: Yes   Electrodesiccation performed over the curetted area: Yes   Lesion length (cm):  1.7 Lesion width (cm):  1.7 Margin per side (cm):  0.2 Final wound size (cm):  2.1 Hemostasis achieved with:  pressure, aluminum chloride and electrodesiccation Outcome: patient tolerated procedure well with no complications   Post-procedure details: sterile dressing applied and wound care instructions given   Dressing type: bandage and petrolatum    mupirocin ointment (BACTROBAN) 2 % Apply 1 Application topically daily. Qd to wounds Specimen 1 - Surgical pathology Differential Diagnosis: D48.5 R/O SCC Check Margins: yes Hyperkeratotic pap 1.7cm EDC  L distal lat pretibial Epidermal / dermal shaving  Lesion diameter (cm):  1.3 Informed consent: discussed and consent obtained   Timeout: patient name, date of birth, surgical site, and procedure verified   Procedure prep:  Patient was prepped  and draped in usual sterile fashion Prep type:  Isopropyl alcohol Anesthesia: the lesion was anesthetized in a standard fashion   Anesthetic:  1% lidocaine w/ epinephrine 1-100,000 buffered w/ 8.4% NaHCO3 Instrument used: flexible razor  blade   Hemostasis achieved with: pressure, aluminum chloride and electrodesiccation   Outcome: patient tolerated procedure well   Post-procedure details: sterile dressing applied and wound care instructions given   Dressing type: bandage and bacitracin    Destruction of lesion Complexity: extensive   Destruction method: electrodesiccation and curettage   Informed consent: discussed and consent obtained   Timeout:  patient name, date of birth, surgical site, and procedure verified Procedure prep:  Patient was prepped and draped in usual sterile fashion Prep type:  Isopropyl alcohol Anesthesia: the lesion was anesthetized in a standard fashion   Anesthetic:  1% lidocaine w/ epinephrine 1-100,000 buffered w/ 8.4% NaHCO3 Curettage performed in three different directions: Yes   Electrodesiccation performed over the curetted area: Yes   Lesion length (cm):  1.3 Lesion width (cm):  1.3 Margin per side (cm):  0.2 Final wound size (cm):  1.7 Hemostasis achieved with:  pressure, aluminum chloride and electrodesiccation Outcome: patient tolerated procedure well with no complications   Post-procedure details: sterile dressing applied and wound care instructions given   Dressing type: bandage and bacitracin    Specimen 2 - Surgical pathology Differential Diagnosis: D48.5 R/O SCC Check Margins: yes Hyperkeratotic pap 1.3cm EDC  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  These are surely sun damage related but may have HPV component too.  We have done "field treatments" in past  with Photodynamic therapy with Levulan as well as "5-FU Chemowraps".   Because of the persistent issue, I sent patient for evaluation to [facility] to see if she may be a candidate for CAPECITABINE oral (5-FU) chemotherapy - or other agent that patient may benefit from.  She saw Dr Tasia Catchings on 04/10/20 who did not recommend any systemic chemotherapy such as Capecitabine, but advise if she had cancers that were refractory or metastatic, she could be a candidate for immunotherapy such as Cemiplimab. She discussed oral retinoids and oral nicotinamide options. She also discussed possible screening for immunocompromised state with testing for HIV; hepatitis and possible genetic counseling for genetic syndromes predisposing to SCC formation.   - No evidence of recurrence today - No lymphadenopathy - Recommend regular full body skin exams - Recommend daily broad spectrum sunscreen SPF 30+ to sun-exposed areas, reapply every 2 hours as needed.  - Call if any new or changing lesions are noted between office visits  Return in about 4 months (around 12/27/2022) for TBSE, Hx of BCC, Hx of SCC, Hx of AKs.  I, Othelia Pulling, RMA, am acting as scribe for Sarina Ser, MD . Documentation: I have reviewed the above documentation for accuracy and completeness, and I agree with the above.  Sarina Ser, MD

## 2022-08-28 NOTE — Patient Instructions (Addendum)
Wound Care Instructions  Cleanse wound gently with soap and water once a day then pat dry with clean gauze. Apply a thin coat of Petrolatum (petroleum jelly, "Vaseline") over the wound (unless you have an allergy to this). We recommend that you use a new, sterile tube of Vaseline. Do not pick or remove scabs. Do not remove the yellow or white "healing tissue" from the base of the wound.  Cover the wound with fresh, clean, nonstick gauze and secure with paper tape. You may use Band-Aids in place of gauze and tape if the wound is small enough, but would recommend trimming much of the tape off as there is often too much. Sometimes Band-Aids can irritate the skin.  You should call the office for your biopsy report after 1 week if you have not already been contacted.  If you experience any problems, such as abnormal amounts of bleeding, swelling, significant bruising, significant pain, or evidence of infection, please call the office immediately.  FOR ADULT SURGERY PATIENTS: If you need something for pain relief you may take 1 extra strength Tylenol (acetaminophen) AND 2 Ibuprofen (200mg each) together every 4 hours as needed for pain. (do not take these if you are allergic to them or if you have a reason you should not take them.) Typically, you may only need pain medication for 1 to 3 days.     Due to recent changes in healthcare laws, you may see results of your pathology and/or laboratory studies on MyChart before the doctors have had a chance to review them. We understand that in some cases there may be results that are confusing or concerning to you. Please understand that not all results are received at the same time and often the doctors may need to interpret multiple results in order to provide you with the best plan of care or course of treatment. Therefore, we ask that you please give us 2 business days to thoroughly review all your results before contacting the office for clarification. Should  we see a critical lab result, you will be contacted sooner.   If You Need Anything After Your Visit  If you have any questions or concerns for your doctor, please call our main line at [phone] and press option 4 to reach your doctor's medical assistant. If no one answers, please leave a voicemail as directed and we will return your call as soon as possible. Messages left after 4 pm will be answered the following business day.   You may also send us a message via MyChart. We typically respond to MyChart messages within 1-2 business days.  For prescription refills, please ask your pharmacy to contact our office. Our fax number is [phone].  If you have an urgent issue when the clinic is closed that cannot wait until the next business day, you can page your doctor at the number below.    Please note that while we do our best to be available for urgent issues outside of office hours, we are not available 24/7.   If you have an urgent issue and are unable to reach us, you may choose to seek medical care at your doctor's office, retail clinic, urgent care center, or emergency room.  If you have a medical emergency, please immediately call 911 or go to the emergency department.  Pager Numbers  - Dr. Kowalski: [phone]  - Dr. Moye: [phone]  - Dr. Stewart: [phone]  In the event of inclement weather, please call our main line at   [phone] for an update on the status of any delays or closures.  Dermatology Medication Tips: Please keep the boxes that topical medications come in in order to help keep track of the instructions about where and how to use these. Pharmacies typically print the medication instructions only on the boxes and not directly on the medication tubes.   If your medication is too expensive, please contact our office at [phone] option 4 or send us a message through MyChart.   We are unable to tell what your co-pay for medications will be in  advance as this is different depending on your insurance coverage. However, we may be able to find a substitute medication at lower cost or fill out paperwork to get insurance to cover a needed medication.   If a prior authorization is required to get your medication covered by your insurance company, please allow us 1-2 business days to complete this process.  Drug prices often vary depending on where the prescription is filled and some pharmacies may offer cheaper prices.  The website www.goodrx.com contains coupons for medications through different pharmacies. The prices here do not account for what the cost may be with help from insurance (it may be cheaper with your insurance), but the website can give you the price if you did not use any insurance.  - You can print the associated coupon and take it with your prescription to the pharmacy.  - You may also stop by our office during regular business hours and pick up a GoodRx coupon card.  - If you need your prescription sent electronically to a different pharmacy, notify our office through [facility] MyChart or by phone at [phone] option 4.     Si Usted Necesita Algo Despus de Su Visita  Tambin puede enviarnos un mensaje a travs de MyChart. Por lo general respondemos a los mensajes de MyChart en el transcurso de 1 a 2 das hbiles.  Para renovar recetas, por favor pida a su farmacia que se ponga en contacto con nuestra oficina. Nuestro nmero de fax es el [phone].  Si tiene un asunto urgente cuando la clnica est cerrada y que no puede esperar hasta el siguiente da hbil, puede llamar/localizar a su doctor(a) al nmero que aparece a continuacin.   Por favor, tenga en cuenta que aunque hacemos todo lo posible para estar disponibles para asuntos urgentes fuera del horario de oficina, no estamos disponibles las 24 horas del da, los 7 das de la semana.   Si tiene un problema urgente y no puede comunicarse con nosotros, puede  optar por buscar atencin mdica  en el consultorio de su doctor(a), en una clnica privada, en un centro de atencin urgente o en una sala de emergencias.  Si tiene una emergencia mdica, por favor llame inmediatamente al 911 o vaya a la sala de emergencias.  Nmeros de bper  - Dr. Kowalski: [phone]  - Dra. Moye: [phone]  - Dra. Stewart: [phone]  En caso de inclemencias del tiempo, por favor llame a nuestra lnea principal al [phone] para una actualizacin sobre el estado de cualquier retraso o cierre.  Consejos para la medicacin en dermatologa: Por favor, guarde las cajas en las que vienen los medicamentos de uso tpico para ayudarle a seguir las instrucciones sobre dnde y cmo usarlos. Las farmacias generalmente imprimen las instrucciones del medicamento slo en las cajas y no directamente en los tubos del medicamento.   Si su medicamento es muy caro, por favor, pngase en contacto con   nuestra oficina llamando al [phone] y presione la opcin 4 o envenos un mensaje a travs de MyChart.   No podemos decirle cul ser su copago por los medicamentos por adelantado ya que esto es diferente dependiendo de la cobertura de su seguro. Sin embargo, es posible que podamos encontrar un medicamento sustituto a menor costo o llenar un formulario para que el seguro cubra el medicamento que se considera necesario.   Si se requiere una autorizacin previa para que su compaa de seguros cubra su medicamento, por favor permtanos de 1 a 2 das hbiles para completar este proceso.  Los precios de los medicamentos varan con frecuencia dependiendo del lugar de dnde se surte la receta y alguna farmacias pueden ofrecer precios ms baratos.  El sitio web www.goodrx.com tiene cupones para medicamentos de diferentes farmacias. Los precios aqu no tienen en cuenta lo que podra costar con la ayuda del seguro (puede ser ms barato con su seguro), pero el sitio web puede darle el  precio si no utiliz ningn seguro.  - Puede imprimir el cupn correspondiente y llevarlo con su receta a la farmacia.  - Tambin puede pasar por nuestra oficina durante el horario de atencin regular y recoger una tarjeta de cupones de GoodRx.  - Si necesita que su receta se enve electrnicamente a una farmacia diferente, informe a nuestra oficina a travs de MyChart de [facility] o por telfono llamando al [phone] y presione la opcin 4.  

## 2022-09-02 ENCOUNTER — Ambulatory Visit (INDEPENDENT_AMBULATORY_CARE_PROVIDER_SITE_OTHER): Payer: PPO

## 2022-09-02 ENCOUNTER — Encounter: Payer: Self-pay | Admitting: Dermatology

## 2022-09-02 DIAGNOSIS — E538 Deficiency of other specified B group vitamins: Secondary | ICD-10-CM

## 2022-09-02 MED ORDER — CYANOCOBALAMIN 1000 MCG/ML IJ SOLN
1000.0000 ug | Freq: Once | INTRAMUSCULAR | Status: AC
  Administered 2022-09-02: 1000 ug via INTRAMUSCULAR

## 2022-09-03 ENCOUNTER — Telehealth: Payer: Self-pay

## 2022-09-03 NOTE — Telephone Encounter (Signed)
-----  Message from Ralene Bathe, MD sent at 09/02/2022  2:06 PM EST ----- Diagnosis 1. Skin (M), left proximal pretibial SQUAMOUS CELL CARCINOMA, KERATOACANTHOMA TYPE 2. 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

## 2022-09-03 NOTE — Telephone Encounter (Signed)
Left pt msg to call for bx results/sh 

## 2022-09-04 ENCOUNTER — Telehealth: Payer: Self-pay

## 2022-09-04 NOTE — Telephone Encounter (Signed)
-----  Message from Ralene Bathe, MD sent at 09/02/2022  2:06 PM EST ----- Diagnosis 1. Skin (M), left proximal pretibial SQUAMOUS CELL CARCINOMA, KERATOACANTHOMA TYPE 2. 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

## 2022-09-04 NOTE — Telephone Encounter (Signed)
Patient advised of BX results .aw 

## 2022-09-08 ENCOUNTER — Other Ambulatory Visit: Payer: Self-pay | Admitting: Dermatology

## 2022-09-08 DIAGNOSIS — L299 Pruritus, unspecified: Secondary | ICD-10-CM

## 2022-09-30 ENCOUNTER — Ambulatory Visit (INDEPENDENT_AMBULATORY_CARE_PROVIDER_SITE_OTHER): Payer: PPO

## 2022-09-30 DIAGNOSIS — E538 Deficiency of other specified B group vitamins: Secondary | ICD-10-CM

## 2022-09-30 MED ORDER — CYANOCOBALAMIN 1000 MCG/ML IJ SOLN
1000.0000 ug | Freq: Once | INTRAMUSCULAR | Status: AC
  Administered 2022-09-30: 1000 ug via INTRAMUSCULAR

## 2022-10-23 ENCOUNTER — Ambulatory Visit: Payer: PPO | Admitting: Dermatology

## 2022-10-23 VITALS — BP 137/63 | HR 78

## 2022-10-23 DIAGNOSIS — Z7189 Other specified counseling: Secondary | ICD-10-CM | POA: Diagnosis not present

## 2022-10-23 DIAGNOSIS — Z8589 Personal history of malignant neoplasm of other organs and systems: Secondary | ICD-10-CM

## 2022-10-23 DIAGNOSIS — D492 Neoplasm of unspecified behavior of bone, soft tissue, and skin: Secondary | ICD-10-CM

## 2022-10-23 DIAGNOSIS — C44722 Squamous cell carcinoma of skin of right lower limb, including hip: Secondary | ICD-10-CM

## 2022-10-23 DIAGNOSIS — Z85828 Personal history of other malignant neoplasm of skin: Secondary | ICD-10-CM | POA: Diagnosis not present

## 2022-10-23 DIAGNOSIS — L578 Other skin changes due to chronic exposure to nonionizing radiation: Secondary | ICD-10-CM | POA: Diagnosis not present

## 2022-10-23 NOTE — Progress Notes (Signed)
Follow-Up Visit   Subjective  Joann Warren is a 77 y.o. female who presents for the following: check spot (R calf, 31m, painful). She asked if there was anything else she could do to prevent these.    The following portions of the chart were reviewed this encounter and updated as appropriate:   Tobacco  Allergies  Meds  Problems  Med Hx  Surg Hx  Fam Hx     Review of Systems:  No other skin or systemic complaints except as noted in HPI or Assessment and Plan.  Objective  Well appearing patient in no apparent distress; mood and affect are within normal limits.  A focused examination was performed including right calf. Relevant physical exam findings are noted in the Assessment and Plan.  R calf Hyperkeratotic pap 1.2cm      Assessment & Plan  Neoplasm of skin R calf Epidermal / dermal shaving  Lesion diameter (cm):  1.2 Informed consent: discussed and consent obtained   Timeout: patient name, date of birth, surgical site, and procedure verified   Procedure prep:  Patient was prepped and draped in usual sterile fashion Prep type:  Isopropyl alcohol Anesthesia: the lesion was anesthetized in a standard fashion   Anesthetic:  1% lidocaine w/ epinephrine 1-100,000 buffered w/ 8.4% NaHCO3 Instrument used: flexible razor blade   Hemostasis achieved with: pressure, aluminum chloride and electrodesiccation   Outcome: patient tolerated procedure well   Post-procedure details: sterile dressing applied and wound care instructions given   Dressing type: bandage and bacitracin    Destruction of lesion Complexity: extensive   Destruction method: electrodesiccation and curettage   Informed consent: discussed and consent obtained   Timeout:  patient name, date of birth, surgical site, and procedure verified Procedure prep:  Patient was prepped and draped in usual sterile fashion Prep type:  Isopropyl alcohol Anesthesia: the lesion was anesthetized in a standard fashion    Anesthetic:  1% lidocaine w/ epinephrine 1-100,000 buffered w/ 8.4% NaHCO3 Curettage performed in three different directions: Yes   Electrodesiccation performed over the curetted area: Yes   Lesion length (cm):  1.2 Lesion width (cm):  1.2 Margin per side (cm):  0.2 Final wound size (cm):  1.6 Hemostasis achieved with:  pressure, aluminum chloride and electrodesiccation Outcome: patient tolerated procedure well with no complications   Post-procedure details: sterile dressing applied and wound care instructions given   Dressing type: bandage and bacitracin    Specimen 1 - Surgical pathology Differential Diagnosis: D48.5 R/O SCC  Check Margins: yes Hyperkeratotic pap 1.2cm EDC  Related Medications mupirocin ointment (BACTROBAN) 2 % Apply 1 Application topically daily. Qd to wounds  History of SCC (squamous cell carcinoma) of skin - Counseling today legs -multiple - see Problem list History of Squamous Cell Carcinoma of the Skin - Numerous, S/P radiation on the B/L lower legs Pt has had multiple;multiple SCCs treated in the past and [facility], but she has persistent frequently occurring SCCs that we treat several new ones every few weeks.  These are surely sun damage related but may have HPV component too.  We have done "field treatments" in past with Photodynamic therapy with Levulan as well as "5-FU Chemowraps".   Because of the persistent issue, [facility] see if she may be a candidate for CAPECITABINE oral (5-FU) chemotherapy -  or other agent that patient may benefit from.  She saw Dr Tasia Catchings on 04/10/20 who did not recommend any systemic chemotherapy such as Capecitabine, but advise if she had cancers that were refractory or metastatic, she could be a candidate for immunotherapy such as Cemiplimab. She discussed oral retinoids and  oral nicotinamide options. She also discussed possible screening for immunocompromised state with testing for HIV; hepatitis and possible genetic counseling for genetic syndromes predisposing to [facility] formation.  Pt has declined oral Retinoids.   She is advised to consider oral Nicotinamide options.[facility]) - may be purchased here in office.  Actinic Damage - chronic, secondary to cumulative UV radiation exposure/sun exposure over time - diffuse scaly erythematous macules with underlying dyspigmentation - Recommend daily broad spectrum sunscreen SPF 30+ to sun-exposed areas, reapply every 2 hours as needed.  - Recommend staying in the shade or wearing long sleeves, sun glasses (UVA+UVB protection) and wide brim hats (4-inch brim around the entire circumference of the hat). - Call for new or changing lesions.  Return for as scheduled for TBSE.  I, Othelia Pulling, RMA, am acting as scribe for Sarina Ser, MD . Documentation: I have reviewed the above documentation for accuracy and completeness, and I agree with the above.  Sarina Ser, MD

## 2022-10-23 NOTE — Patient Instructions (Addendum)
Wound Care Instructions  Cleanse wound gently with soap and water once a day then pat dry with clean gauze. Apply a thin coat of Petrolatum (petroleum jelly, "Vaseline") over the wound (unless you have an allergy to this). We recommend that you use a new, sterile tube of Vaseline. Do not pick or remove scabs. Do not remove the yellow or white "healing tissue" from the base of the wound.  Cover the wound with fresh, clean, nonstick gauze and secure with paper tape. You may use Band-Aids in place of gauze and tape if the wound is small enough, but would recommend trimming much of the tape off as there is often too much. Sometimes Band-Aids can irritate the skin.  You should call the office for your biopsy report after 1 week if you have not already been contacted.  If you experience any problems, such as abnormal amounts of bleeding, swelling, significant bruising, significant pain, or evidence of infection, please call the office immediately.  FOR ADULT SURGERY PATIENTS: If you need something for pain relief you may take 1 extra strength Tylenol (acetaminophen) AND 2 Ibuprofen (200mg each) together every 4 hours as needed for pain. (do not take these if you are allergic to them or if you have a reason you should not take them.) Typically, you may only need pain medication for 1 to 3 days.     Due to recent changes in healthcare laws, you may see results of your pathology and/or laboratory studies on MyChart before the doctors have had a chance to review them. We understand that in some cases there may be results that are confusing or concerning to you. Please understand that not all results are received at the same time and often the doctors may need to interpret multiple results in order to provide you with the best plan of care or course of treatment. Therefore, we ask that you please give us 2 business days to thoroughly review all your results before contacting the office for clarification. Should  we see a critical lab result, you will be contacted sooner.   If You Need Anything After Your Visit  If you have any questions or concerns for your doctor, please call our main line at [phone] and press option 4 to reach your doctor's medical assistant. If no one answers, please leave a voicemail as directed and we will return your call as soon as possible. Messages left after 4 pm will be answered the following business day.   You may also send us a message via MyChart. We typically respond to MyChart messages within 1-2 business days.  For prescription refills, please ask your pharmacy to contact our office. Our fax number is [phone].  If you have an urgent issue when the clinic is closed that cannot wait until the next business day, you can page your doctor at the number below.    Please note that while we do our best to be available for urgent issues outside of office hours, we are not available 24/7.   If you have an urgent issue and are unable to reach us, you may choose to seek medical care at your doctor's office, retail clinic, urgent care center, or emergency room.  If you have a medical emergency, please immediately call 911 or go to the emergency department.  Pager Numbers  - Dr. Kowalski: [phone]  - Dr. Moye: [phone]  - Dr. Stewart: [phone]  In the event of inclement weather, please call our main line at   [phone] for an update on the status of any delays or closures.  Dermatology Medication Tips: Please keep the boxes that topical medications come in in order to help keep track of the instructions about where and how to use these. Pharmacies typically print the medication instructions only on the boxes and not directly on the medication tubes.   If your medication is too expensive, please contact our office at [phone] option 4 or send us a message through MyChart.   We are unable to tell what your co-pay for medications will be in  advance as this is different depending on your insurance coverage. However, we may be able to find a substitute medication at lower cost or fill out paperwork to get insurance to cover a needed medication.   If a prior authorization is required to get your medication covered by your insurance company, please allow us 1-2 business days to complete this process.  Drug prices often vary depending on where the prescription is filled and some pharmacies may offer cheaper prices.  The website www.goodrx.com contains coupons for medications through different pharmacies. The prices here do not account for what the cost may be with help from insurance (it may be cheaper with your insurance), but the website can give you the price if you did not use any insurance.  - You can print the associated coupon and take it with your prescription to the pharmacy.  - You may also stop by our office during regular business hours and pick up a GoodRx coupon card.  - If you need your prescription sent electronically to a different pharmacy, notify our office through  MyChart or by phone at [phone] option 4.     Si Usted Necesita Algo Despus de Su Visita  Tambin puede enviarnos un mensaje a travs de MyChart. Por lo general respondemos a los mensajes de MyChart en el transcurso de 1 a 2 das hbiles.  Para renovar recetas, por favor pida a su farmacia que se ponga en contacto con nuestra oficina. Nuestro nmero de fax es el [phone].  Si tiene un asunto urgente cuando la clnica est cerrada y que no puede esperar hasta el siguiente da hbil, puede llamar/localizar a su doctor(a) al nmero que aparece a continuacin.   Por favor, tenga en cuenta que aunque hacemos todo lo posible para estar disponibles para asuntos urgentes fuera del horario de oficina, no estamos disponibles las 24 horas del da, los 7 das de la semana.   Si tiene un problema urgente y no puede comunicarse con nosotros, puede  optar por buscar atencin mdica  en el consultorio de su doctor(a), en una clnica privada, en un centro de atencin urgente o en una sala de emergencias.  Si tiene una emergencia mdica, por favor llame inmediatamente al 911 o vaya a la sala de emergencias.  Nmeros de bper  - Dr. Kowalski: [phone]  - Dra. Moye: [phone]  - Dra. Stewart: [phone]  En caso de inclemencias del tiempo, por favor llame a nuestra lnea principal al [phone] para una actualizacin sobre el estado de cualquier retraso o cierre.  Consejos para la medicacin en dermatologa: Por favor, guarde las cajas en las que vienen los medicamentos de uso tpico para ayudarle a seguir las instrucciones sobre dnde y cmo usarlos. Las farmacias generalmente imprimen las instrucciones del medicamento slo en las cajas y no directamente en los tubos del medicamento.   Si su medicamento es muy caro, por favor, pngase en contacto con   nuestra oficina llamando al [phone] y presione la opcin 4 o envenos un mensaje a travs de MyChart.   No podemos decirle cul ser su copago por los medicamentos por adelantado ya que esto es diferente dependiendo de la cobertura de su seguro. Sin embargo, es posible que podamos encontrar un medicamento sustituto a menor costo o llenar un formulario para que el seguro cubra el medicamento que se considera necesario.   Si se requiere una autorizacin previa para que su compaa de seguros cubra su medicamento, por favor permtanos de 1 a 2 das hbiles para completar este proceso.  Los precios de los medicamentos varan con frecuencia dependiendo del lugar de dnde se surte la receta y alguna farmacias pueden ofrecer precios ms baratos.  El sitio web www.goodrx.com tiene cupones para medicamentos de diferentes farmacias. Los precios aqu no tienen en cuenta lo que podra costar con la ayuda del seguro (puede ser ms barato con su seguro), pero el sitio web puede darle el  precio si no utiliz ningn seguro.  - Puede imprimir el cupn correspondiente y llevarlo con su receta a la farmacia.  - Tambin puede pasar por nuestra oficina durante el horario de atencin regular y recoger una tarjeta de cupones de GoodRx.  - Si necesita que su receta se enve electrnicamente a una farmacia diferente, informe a nuestra oficina a travs de MyChart de [place] o por telfono llamando al [phone] y presione la opcin 4.  

## 2022-10-26 ENCOUNTER — Encounter: Payer: Self-pay | Admitting: Dermatology

## 2022-10-28 ENCOUNTER — Ambulatory Visit (INDEPENDENT_AMBULATORY_CARE_PROVIDER_SITE_OTHER): Payer: PPO

## 2022-10-28 DIAGNOSIS — E538 Deficiency of other specified B group vitamins: Secondary | ICD-10-CM

## 2022-10-28 MED ORDER — CYANOCOBALAMIN 1000 MCG/ML IJ SOLN
1000.0000 ug | Freq: Once | INTRAMUSCULAR | Status: AC
  Administered 2022-10-28: 1000 ug via INTRAMUSCULAR

## 2022-10-30 ENCOUNTER — Telehealth: Payer: Self-pay

## 2022-10-30 NOTE — Telephone Encounter (Signed)
-----   Message from Ralene Bathe, MD sent at 10/29/2022  6:10 PM EDT ----- Diagnosis Skin , right calf WELL DIFFERENTIATED SQUAMOUS CELL CARCINOMA  Cancer - SCC Already treated Recheck next visit

## 2022-10-30 NOTE — Telephone Encounter (Signed)
Patient advised of BX results .aw 

## 2022-10-30 NOTE — Telephone Encounter (Signed)
Left pt msg to call for bx results/sh 

## 2022-12-02 ENCOUNTER — Ambulatory Visit (INDEPENDENT_AMBULATORY_CARE_PROVIDER_SITE_OTHER): Payer: PPO

## 2022-12-02 DIAGNOSIS — E538 Deficiency of other specified B group vitamins: Secondary | ICD-10-CM | POA: Diagnosis not present

## 2022-12-02 MED ORDER — CYANOCOBALAMIN 1000 MCG/ML IJ SOLN
1000.0000 ug | Freq: Once | INTRAMUSCULAR | Status: AC
  Administered 2022-12-02: 1000 ug via INTRAMUSCULAR

## 2022-12-30 ENCOUNTER — Ambulatory Visit (INDEPENDENT_AMBULATORY_CARE_PROVIDER_SITE_OTHER): Payer: PPO

## 2022-12-30 DIAGNOSIS — E538 Deficiency of other specified B group vitamins: Secondary | ICD-10-CM

## 2022-12-30 MED ORDER — CYANOCOBALAMIN 1000 MCG/ML IJ SOLN
1000.0000 ug | Freq: Once | INTRAMUSCULAR | Status: AC
  Administered 2022-12-30: 1000 ug via INTRAMUSCULAR

## 2022-12-31 ENCOUNTER — Ambulatory Visit: Payer: PPO | Admitting: Dermatology

## 2022-12-31 VITALS — BP 122/70 | HR 76

## 2022-12-31 DIAGNOSIS — C44721 Squamous cell carcinoma of skin of unspecified lower limb, including hip: Secondary | ICD-10-CM

## 2022-12-31 DIAGNOSIS — L578 Other skin changes due to chronic exposure to nonionizing radiation: Secondary | ICD-10-CM | POA: Diagnosis not present

## 2022-12-31 DIAGNOSIS — W908XXA Exposure to other nonionizing radiation, initial encounter: Secondary | ICD-10-CM

## 2022-12-31 DIAGNOSIS — Z7189 Other specified counseling: Secondary | ICD-10-CM

## 2022-12-31 DIAGNOSIS — L570 Actinic keratosis: Secondary | ICD-10-CM

## 2022-12-31 DIAGNOSIS — D492 Neoplasm of unspecified behavior of bone, soft tissue, and skin: Secondary | ICD-10-CM

## 2022-12-31 DIAGNOSIS — C44722 Squamous cell carcinoma of skin of right lower limb, including hip: Secondary | ICD-10-CM | POA: Diagnosis not present

## 2022-12-31 DIAGNOSIS — C44529 Squamous cell carcinoma of skin of other part of trunk: Secondary | ICD-10-CM

## 2022-12-31 DIAGNOSIS — X32XXXA Exposure to sunlight, initial encounter: Secondary | ICD-10-CM

## 2022-12-31 DIAGNOSIS — Z8589 Personal history of malignant neoplasm of other organs and systems: Secondary | ICD-10-CM

## 2022-12-31 DIAGNOSIS — C44729 Squamous cell carcinoma of skin of left lower limb, including hip: Secondary | ICD-10-CM

## 2022-12-31 DIAGNOSIS — Z85828 Personal history of other malignant neoplasm of skin: Secondary | ICD-10-CM | POA: Diagnosis not present

## 2022-12-31 MED ORDER — MUPIROCIN 2 % EX OINT: 1.0000 | TOPICAL_OINTMENT | Freq: Every day | CUTANEOUS | 11 refills | Status: DC

## 2022-12-31 NOTE — Patient Instructions (Addendum)
Recommend taking Heliocare sun protection supplement daily in sunny weather for additional sun protection. For maximum protection on the sunniest days, you can take up to 2 capsules of regular Heliocare OR take 1 capsule of Heliocare Ultra. For prolonged exposure (such as a full day in the sun), you can repeat your dose of the supplement 4 hours after your first dose. Heliocare can be purchased at [employer], at some [facility] or at [email or web address].   RECOMMEND HELIOCARE ADVANCED    Cryotherapy Aftercare  Wash gently with soap and water everyday.   Apply Vaseline and Band-Aid daily until healed.        Wound Care Instructions  Cleanse wound gently with soap and water once a day then pat dry with clean gauze. Apply a thin coat of Petrolatum (petroleum jelly, "Vaseline") over the wound (unless you have an allergy to this). We recommend that you use a new, sterile tube of Vaseline. Do not pick or remove scabs. Do not remove the yellow or white "healing tissue" from the base of the wound.  Cover the wound with fresh, clean, nonstick gauze and secure with paper tape. You may use Band-Aids in place of gauze and tape if the wound is small enough, but would recommend trimming much of the tape off as there is often too much. Sometimes Band-Aids can irritate the skin.  You should call the office for your biopsy report after 1 week if you have not already been contacted.  If you experience any problems, such as abnormal amounts of bleeding, swelling, significant bruising, significant pain, or evidence of infection, please call the office immediately.  FOR ADULT SURGERY PATIENTS: If you need something for pain relief you may take 1 extra strength Tylenol (acetaminophen) AND 2 Ibuprofen (200mg  each) together every 4 hours as needed for pain. (do not take these if you are allergic to them or if you have a reason you should not take them.) Typically, you may only need pain medication for 1 to 3  days.          Due to recent changes in healthcare laws, you may see results of your pathology and/or laboratory studies on MyChart before the doctors have had a chance to review them. We understand that in some cases there may be results that are confusing or concerning to you. Please understand that not all results are received at the same time and often the doctors may need to interpret multiple results in order to provide you with the best plan of care or course of treatment. Therefore, we ask that you please give [place] 2 business days to thoroughly review all your results before contacting the office for clarification. Should we see a critical lab result, you will be contacted sooner.   If You Need Anything After Your Visit  If you have any questions or concerns for your doctor, please call our main line at [phone] and press option 4 to reach your doctor's medical assistant. If no one answers, please leave a voicemail as directed and we will return your call as soon as possible. Messages left after 4 pm will be answered the following business day.   You may also send [place] a message via MyChart. We typically respond to MyChart messages within 1-2 business days.  For prescription refills, please ask your pharmacy to contact our office. Our fax number is [phone].  If you have an urgent issue when the clinic is closed that cannot wait until the next business day,  you can page your doctor at the number below.    Please note that while we do our best to be available for urgent issues outside of office hours, we are not available 24/7.   If you have an urgent issue and are unable to reach [place], you may choose to seek medical care at your doctor's office, retail clinic, urgent care center, or emergency room.  If you have a medical emergency, please immediately call 911 or go to the emergency department.  Pager Numbers  - Dr. Gwen Pounds: [phone]  - Dr. Neale Burly: [phone]  - Dr.  Roseanne Reno: [phone]  In the event of inclement weather, please call our main line at [phone] for an update on the status of any delays or closures.  Dermatology Medication Tips: Please keep the boxes that topical medications come in in order to help keep track of the instructions about where and how to use these. Pharmacies typically print the medication instructions only on the boxes and not directly on the medication tubes.   If your medication is too expensive, please contact our office at [phone] option 4 or send [place] a message through MyChart.   We are unable to tell what your co-pay for medications will be in advance as this is different depending on your insurance coverage. However, we may be able to find a substitute medication at lower cost or fill out paperwork to get insurance to cover a needed medication.   If a prior authorization is required to get your medication covered by your insurance company, please allow [place] 1-2 business days to complete this process.  Drug prices often vary depending on where the prescription is filled and some pharmacies may offer cheaper prices.  The website www.goodrx.com contains coupons for medications through different pharmacies. The prices here do not account for what the cost may be with help from insurance (it may be cheaper with your insurance), but the website can give you the price if you did not use any insurance.  - You can print the associated coupon and take it with your prescription to the pharmacy.  - You may also stop by our office during regular business hours and pick up a GoodRx coupon card.  - If you need your prescription sent electronically to a different pharmacy, notify our office through [facility] or by phone at [phone] option 4.     Si Usted Necesita Algo Despus de Su Visita  Tambin puede enviarnos un mensaje a travs de Clinical cytogeneticist. Por lo general respondemos a los mensajes de MyChart en el transcurso  de 1 a 2 das hbiles.  Para renovar recetas, por favor pida a su farmacia que se ponga en contacto con nuestra oficina. Annie Sable de fax es [place] [phone].  Si tiene un asunto urgente cuando la clnica est cerrada y que no puede esperar hasta el siguiente da hbil, puede llamar/localizar a su doctor(a) al nmero que aparece a continuacin.   Por favor, tenga en cuenta que aunque hacemos todo lo posible para estar disponibles para asuntos urgentes fuera del horario de [place], no estamos disponibles las 24 horas del da, los 7 [address]  [address] de la [place].   Si tiene un problema urgente y no puede comunicarse con nosotros, puede optar por buscar atencin mdica  en el consultorio de su doctor(a), en una clnica privada, en un centro de atencin urgente o en una sala de emergencias.  Si tiene una emergencia mdica, por favor llame inmediatamente al 911 o vaya a  la sala de emergencias.  Nmeros de bper  - Dr. Gwen Pounds: [phone]  - Dra. Moye: [phone]  - Dra. Roseanne Reno: [phone]  En caso de inclemencias del [place], por favor llame a Lacy Duverney principal al [phone] para una actualizacin sobre el [place] de cualquier retraso o cierre.  Consejos para la medicacin en dermatologa: Por favor, guarde las cajas en las que vienen los medicamentos de uso tpico para ayudarle a seguir las instrucciones sobre dnde y cmo usarlos. Las farmacias generalmente imprimen las instrucciones del medicamento slo en las cajas y no directamente en los tubos del [place].   Si su medicamento es muy caro, por favor, pngase en contacto con Rolm Gala llamando al [phone] y presione la opcin 4 o envenos un mensaje a travs de Clinical cytogeneticist.   No podemos decirle cul ser su copago por los medicamentos por adelantado ya que esto es diferente dependiendo de la cobertura de su seguro. Sin embargo, es posible que podamos encontrar un medicamento sustituto a Audiological scientist un formulario  para que el seguro cubra el medicamento que se considera necesario.   Si se requiere una autorizacin previa para que su compaa de seguros [place] su medicamento, por favor permtanos de 1 a 2 das hbiles para completar [address]  Los precios de los medicamentos varan con frecuencia dependiendo del Environmental consultant de dnde se surte la receta y alguna farmacias pueden ofrecer precios ms baratos.  El sitio web www.goodrx.com tiene cupones para medicamentos de Health and safety inspector. Los precios aqu no tienen en cuenta lo que podra costar con la ayuda del seguro (puede ser ms barato con su seguro), pero el sitio web puede darle el precio si no utiliz Tourist information centre manager.  - Puede imprimir el cupn correspondiente y llevarlo con su receta a la farmacia.  - Tambin puede pasar por nuestra oficina durante el horario de atencin regular y Education officer, museum una tarjeta de cupones de GoodRx.  - Si necesita que su receta se enve electrnicamente a una farmacia diferente, informe a nuestra oficina a travs de MyChart de [place] o por telfono llamando al [phone] y presione la opcin 4.

## 2022-12-31 NOTE — Progress Notes (Signed)
Follow-Up Visit   Subjective  Joann Warren is a 77 y.o. female who presents for the following: The patient has spots, moles and lesions to be evaluated, some may be new or changing and the patient may have concern these could be cancer.   The following portions of the chart were reviewed this encounter and updated as appropriate: medications, allergies, medical history  Review of Systems:  No other skin or systemic complaints except as noted in HPI or Assessment and Plan.  Objective  Well appearing patient in no apparent distress; mood and affect are within normal limits. A focused examination was performed of the following areas: Relevant exam findings are noted in the Assessment and Plan.  Right Lower Leg - Anterior 2.2 cm keratotic papule       Left Lower Leg - Anterior 1.7 cm keratotic papule        left chest superior 1.5 cm keratotic papule          left chest inferior 1.3 cm keratotic papule        lower legs x 16 (16) Erythematous thin papules/macules with gritty scale.    Assessment & Plan   Neoplasm of skin (4) Right Lower Leg - Anterior  Epidermal / dermal shaving  Lesion diameter (cm):  2.2 Informed consent: discussed and consent obtained   Timeout: patient name, date of birth, surgical site, and procedure verified   Procedure prep:  Patient was prepped and draped in usual sterile fashion Prep type:  Isopropyl alcohol Anesthesia: the lesion was anesthetized in a standard fashion   Anesthetic:  1% lidocaine w/ epinephrine 1-100,000 buffered w/ 8.4% NaHCO3 Hemostasis achieved with: pressure, aluminum chloride and electrodesiccation   Outcome: patient tolerated procedure well   Post-procedure details: sterile dressing applied and wound care instructions given   Dressing type: bandage and petrolatum    Destruction of lesion  Destruction method: electrodesiccation and curettage   Informed consent: discussed and consent obtained    Timeout:  patient name, date of birth, surgical site, and procedure verified Anesthesia: the lesion was anesthetized in a standard fashion   Anesthetic:  1% lidocaine w/ epinephrine 1-100,000 buffered w/ 8.4% NaHCO3 Curettage performed in three different directions: Yes   Electrodesiccation performed over the curetted area: Yes   Curettage cycles:  3 Lesion length (cm):  2.2 Lesion width (cm):  2.2 Margin per side (cm):  0.2 Final wound size (cm):  2.6 Hemostasis achieved with:  electrodesiccation Outcome: patient tolerated procedure well with no complications   Post-procedure details: sterile dressing applied and wound care instructions given   Dressing type: petrolatum    Specimen 1 - Surgical pathology Differential Diagnosis: R/O SCC  Check Margins: No  Left Lower Leg - Anterior  Epidermal / dermal shaving  Lesion diameter (cm):  1.7 Informed consent: discussed and consent obtained   Timeout: patient name, date of birth, surgical site, and procedure verified   Procedure prep:  Patient was prepped and draped in usual sterile fashion Prep type:  Isopropyl alcohol Anesthesia: the lesion was anesthetized in a standard fashion   Anesthetic:  1% lidocaine w/ epinephrine 1-100,000 buffered w/ 8.4% NaHCO3 Hemostasis achieved with: pressure, aluminum chloride and electrodesiccation   Outcome: patient tolerated procedure well   Post-procedure details: sterile dressing applied and wound care instructions given   Dressing type: bandage and petrolatum    Destruction of lesion  Destruction method: electrodesiccation and curettage   Informed consent: discussed and consent obtained   Timeout:  patient  name, date of birth, surgical site, and procedure verified Anesthesia: the lesion was anesthetized in a standard fashion   Anesthetic:  1% lidocaine w/ epinephrine 1-100,000 buffered w/ 8.4% NaHCO3 Curettage performed in three different directions: Yes   Electrodesiccation performed  over the curetted area: Yes   Curettage cycles:  3 Lesion length (cm):  1.7 Lesion width (cm):  1.7 Margin per side (cm):  0.2 Final wound size (cm):  2.1 Hemostasis achieved with:  electrodesiccation Outcome: patient tolerated procedure well with no complications   Post-procedure details: sterile dressing applied and wound care instructions given   Dressing type: petrolatum    Specimen 2 - Surgical pathology Differential Diagnosis: R/O SCC  Check Margins: No  left chest superior  Epidermal / dermal shaving  Lesion diameter (cm):  1.5 Informed consent: discussed and consent obtained   Timeout: patient name, date of birth, surgical site, and procedure verified   Procedure prep:  Patient was prepped and draped in usual sterile fashion Prep type:  Isopropyl alcohol Anesthesia: the lesion was anesthetized in a standard fashion   Anesthetic:  1% lidocaine w/ epinephrine 1-100,000 buffered w/ 8.4% NaHCO3 Hemostasis achieved with: pressure, aluminum chloride and electrodesiccation   Outcome: patient tolerated procedure well   Post-procedure details: sterile dressing applied and wound care instructions given   Dressing type: bandage and petrolatum    Destruction of lesion Complexity: extensive   Destruction method: electrodesiccation and curettage   Informed consent: discussed and consent obtained   Timeout:  patient name, date of birth, surgical site, and procedure verified Procedure prep:  Patient was prepped and draped in usual sterile fashion Prep type:  Isopropyl alcohol Anesthesia: the lesion was anesthetized in a standard fashion   Anesthetic:  1% lidocaine w/ epinephrine 1-100,000 buffered w/ 8.4% NaHCO3 Curettage performed in three different directions: Yes   Electrodesiccation performed over the curetted area: Yes   Curettage cycles:  3 Lesion length (cm):  1.5 Lesion width (cm):  1.5 Margin per side (cm):  0.2 Final wound size (cm):  1.9 Hemostasis achieved with:   pressure, aluminum chloride and electrodesiccation Outcome: patient tolerated procedure well with no complications   Post-procedure details: sterile dressing applied and wound care instructions given   Dressing type: petrolatum    Specimen 3 - Surgical pathology Differential Diagnosis: R/O SCC  Check Margins: No  left chest inferior  Epidermal / dermal shaving  Lesion diameter (cm):  1.5 Informed consent: discussed and consent obtained   Timeout: patient name, date of birth, surgical site, and procedure verified   Procedure prep:  Patient was prepped and draped in usual sterile fashion Prep type:  Isopropyl alcohol Anesthesia: the lesion was anesthetized in a standard fashion   Anesthetic:  1% lidocaine w/ epinephrine 1-100,000 buffered w/ 8.4% NaHCO3 Instrument used: flexible razor blade   Hemostasis achieved with: pressure, aluminum chloride and electrodesiccation   Outcome: patient tolerated procedure well   Post-procedure details: sterile dressing applied and wound care instructions given   Dressing type: bandage and petrolatum    Destruction of lesion Complexity: extensive   Destruction method: electrodesiccation and curettage   Informed consent: discussed and consent obtained   Timeout:  patient name, date of birth, surgical site, and procedure verified Procedure prep:  Patient was prepped and draped in usual sterile fashion Prep type:  Isopropyl alcohol Anesthesia: the lesion was anesthetized in a standard fashion   Anesthetic:  1% lidocaine w/ epinephrine 1-100,000 buffered w/ 8.4% NaHCO3 Curettage performed in three different directions:  Yes   Electrodesiccation performed over the curetted area: Yes   Lesion length (cm):  1.5 Lesion width (cm):  1.5 Margin per side (cm):  0.2 Final wound size (cm):  1.9 Hemostasis achieved with:  pressure, aluminum chloride and electrodesiccation Outcome: patient tolerated procedure well with no complications   Post-procedure  details: sterile dressing applied and wound care instructions given   Dressing type: bandage and petrolatum    Specimen 4 - Surgical pathology Differential Diagnosis: R/O SCC  Check Margins: No  Recommend starting Heliocare Advanced tablets daily   Related Medications mupirocin ointment (BACTROBAN) 2 % Apply 1 Application topically daily. Qd to wounds  AK (actinic keratosis) (16) lower legs x 16  Actinic keratoses are precancerous spots that appear secondary to cumulative UV radiation exposure/sun exposure over time. They are chronic with expected duration over 1 year. A portion of actinic keratoses will progress to squamous cell carcinoma of the skin. It is not possible to reliably predict which spots will progress to skin cancer and so treatment is recommended to prevent development of skin cancer.  Recommend daily broad spectrum sunscreen SPF 30+ to sun-exposed areas, reapply every 2 hours as needed.  Recommend staying in the shade or wearing long sleeves, sun glasses (UVA+UVB protection) and wide brim hats (4-inch brim around the entire circumference of the hat). Call for new or changing lesions.   Destruction of lesion - lower legs x 16 Complexity: simple   Destruction method: cryotherapy   Informed consent: discussed and consent obtained   Timeout:  patient name, date of birth, surgical site, and procedure verified Lesion destroyed using liquid nitrogen: Yes   Region frozen until ice ball extended beyond lesion: Yes   Outcome: patient tolerated procedure well with no complications   Post-procedure details: wound care instructions given    Counseling and coordination of care  Actinic skin damage  History of basal cell carcinoma  History of squamous cell carcinoma  ACTINIC DAMAGE - chronic, secondary to cumulative UV radiation exposure/sun exposure over time - diffuse scaly erythematous macules with underlying dyspigmentation - Recommend daily broad spectrum sunscreen  SPF 30+ to sun-exposed areas, reapply every 2 hours as needed.  - Recommend staying in the shade or wearing long sleeves, sun glasses (UVA+UVB protection) and wide brim hats (4-inch brim around the entire circumference of the hat). - Call for new or changing lesions.  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  These are surely sun damage related but may have HPV component too.  We have done "field treatments" in past with Photodynamic therapy with Levulan as well as "5-FU Chemowraps".   Because of the persistent issue, I sent patient for evaluation to [facility] - Oncology to see if she may be a candidate for CAPECITABINE oral (5-FU) chemotherapy - or other agent that patient may benefit from.  She saw Dr Cathie Hoops on 04/10/20 who did not recommend any systemic chemotherapy such as Capecitabine, but advise if she had cancers that were refractory or metastatic, she could be a candidate for immunotherapy such as Cemiplimab. She discussed  oral retinoids and oral nicotinamide options. She also discussed possible screening for immunocompromised state with testing for HIV; hepatitis and possible genetic counseling for genetic syndromes predisposing to [facility] formation.  Pt has declined oral Retinoids.   She is advised to consider oral Nicotinamide options.[facility]) - may be purchased here in office = HELIOCARE ADVANCE.    Return in about 3 months (around 04/02/2023) for hx of skin cancers .  IAngelique Holm, CMA, am acting as scribe for Armida Sans, MD .   Documentation: I  have reviewed the above documentation for accuracy and completeness, and I agree with the above.  Armida Sans, MD

## 2023-01-01 ENCOUNTER — Ambulatory Visit: Payer: PPO

## 2023-01-05 ENCOUNTER — Telehealth: Payer: Self-pay

## 2023-01-05 NOTE — Telephone Encounter (Signed)
Discussed biopsy results with patient, patient would like a copy of the biopsy results for her cancer insurance policy, copy left up front for pt to come by and pick up

## 2023-01-05 NOTE — Telephone Encounter (Signed)
Left pt msg to call for bx results/sh 

## 2023-01-05 NOTE — Telephone Encounter (Signed)
-----   Message from Deirdre Evener, MD sent at 01/03/2023  4:15 PM EDT ----- Diagnosis 1. Skin , right lower leg anterior WELL DIFFERENTIATED SQUAMOUS CELL CARCINOMA 2. Skin , left lower leg anterior WELL DIFFERENTIATED SQUAMOUS CELL CARCINOMA 3. Skin , left chest shoulder MODERATELY DIFFERENTIATED SQUAMOUS CELL CARCINOMA, ADENOID VARIANT 4. Skin , left chest inferior WELL DIFFERENTIATED SQUAMOUS CELL CARCINOMA  1, 2, 3, 4-all four showed cancer  = squamous cell carcinoma All 4 were already treated Recheck next visit Left chest superior lesion was moderately differentiated.  This 1 has a higher likelihood of recurrence.  Call for appointment if any recurrence or new lesion formation.

## 2023-01-14 ENCOUNTER — Encounter: Payer: Self-pay | Admitting: Dermatology

## 2023-01-20 DIAGNOSIS — H40002 Preglaucoma, unspecified, left eye: Secondary | ICD-10-CM | POA: Diagnosis not present

## 2023-01-27 DIAGNOSIS — H169 Unspecified keratitis: Secondary | ICD-10-CM | POA: Diagnosis not present

## 2023-01-27 DIAGNOSIS — H2513 Age-related nuclear cataract, bilateral: Secondary | ICD-10-CM | POA: Diagnosis not present

## 2023-01-27 DIAGNOSIS — H401112 Primary open-angle glaucoma, right eye, moderate stage: Secondary | ICD-10-CM | POA: Diagnosis not present

## 2023-02-03 ENCOUNTER — Ambulatory Visit (INDEPENDENT_AMBULATORY_CARE_PROVIDER_SITE_OTHER): Payer: PPO

## 2023-02-03 DIAGNOSIS — E538 Deficiency of other specified B group vitamins: Secondary | ICD-10-CM | POA: Diagnosis not present

## 2023-02-03 DIAGNOSIS — H169 Unspecified keratitis: Secondary | ICD-10-CM | POA: Diagnosis not present

## 2023-02-03 MED ORDER — CYANOCOBALAMIN 1000 MCG/ML IJ SOLN
1000.0000 ug | Freq: Once | INTRAMUSCULAR | Status: AC
Start: 2023-02-03 — End: 2023-02-03
  Administered 2023-02-03: 1000 ug via INTRAMUSCULAR

## 2023-02-09 ENCOUNTER — Other Ambulatory Visit: Payer: Self-pay | Admitting: Dermatology

## 2023-02-09 DIAGNOSIS — L299 Pruritus, unspecified: Secondary | ICD-10-CM

## 2023-03-03 ENCOUNTER — Ambulatory Visit (INDEPENDENT_AMBULATORY_CARE_PROVIDER_SITE_OTHER): Payer: PPO

## 2023-03-03 DIAGNOSIS — E538 Deficiency of other specified B group vitamins: Secondary | ICD-10-CM | POA: Diagnosis not present

## 2023-03-03 MED ORDER — CYANOCOBALAMIN 1000 MCG/ML IJ SOLN
1000.0000 ug | Freq: Once | INTRAMUSCULAR | Status: AC
Start: 2023-03-03 — End: 2023-03-03
  Administered 2023-03-03: 1000 ug via INTRAMUSCULAR

## 2023-03-24 ENCOUNTER — Ambulatory Visit (INDEPENDENT_AMBULATORY_CARE_PROVIDER_SITE_OTHER): Payer: PPO

## 2023-03-24 DIAGNOSIS — E538 Deficiency of other specified B group vitamins: Secondary | ICD-10-CM

## 2023-03-24 MED ORDER — CYANOCOBALAMIN 1000 MCG/ML IJ SOLN
1000.0000 ug | Freq: Once | INTRAMUSCULAR | Status: AC
Start: 2023-03-24 — End: 2023-03-24
  Administered 2023-03-24: 1000 ug via INTRAMUSCULAR

## 2023-03-26 ENCOUNTER — Encounter: Payer: Self-pay | Admitting: Dermatology

## 2023-03-26 ENCOUNTER — Ambulatory Visit: Payer: PPO | Admitting: Dermatology

## 2023-03-26 DIAGNOSIS — L578 Other skin changes due to chronic exposure to nonionizing radiation: Secondary | ICD-10-CM

## 2023-03-26 DIAGNOSIS — C44729 Squamous cell carcinoma of skin of left lower limb, including hip: Secondary | ICD-10-CM

## 2023-03-26 DIAGNOSIS — Z85828 Personal history of other malignant neoplasm of skin: Secondary | ICD-10-CM | POA: Diagnosis not present

## 2023-03-26 DIAGNOSIS — D485 Neoplasm of uncertain behavior of skin: Secondary | ICD-10-CM

## 2023-03-26 DIAGNOSIS — C44722 Squamous cell carcinoma of skin of right lower limb, including hip: Secondary | ICD-10-CM | POA: Diagnosis not present

## 2023-03-26 DIAGNOSIS — D0471 Carcinoma in situ of skin of right lower limb, including hip: Secondary | ICD-10-CM | POA: Diagnosis not present

## 2023-03-26 DIAGNOSIS — W908XXA Exposure to other nonionizing radiation, initial encounter: Secondary | ICD-10-CM | POA: Diagnosis not present

## 2023-03-26 DIAGNOSIS — Z8589 Personal history of malignant neoplasm of other organs and systems: Secondary | ICD-10-CM | POA: Diagnosis not present

## 2023-03-26 NOTE — Patient Instructions (Signed)
Shave Excision Benign Lesion Wound Care Instructions  Leave the original bandage on for 24 hours if possible.  If the bandage becomes soaked or soiled before that time, it is OK to remove it and examine the wound.  A small amount of post-operative bleeding is normal.  If excessive bleeding occurs, remove the bandage, place gauze over the site and apply continuous pressure (no peeking) over the area for 20-30 minutes.  If this does not stop the bleeding, try again for 40 minutes.  If this does not work, please call our clinic as soon as possible (even if after-hours).    Twice a day, cleanse the wound with soap and water.  If a thick crust develops you may use a Q-tip dipped into dilute hydrogen peroxide (mix 1:1 with water) to dissolve it.  Hydrogen peroxide can slow the healing process, so use it only as needed.  After washing, apply Vaseline jelly or Polysporin ointment.  For best healing, the wound should be covered with a layer of ointment at all times.  This may mean re-applying the ointment several times a day.  For open wounds, continue until it has healed.    If you have any swelling, keep the area elevated.  Some redness, tenderness and white or yellow material in the wound is normal healing.  If the area becomes very sore and red, or develops a thick yellow-green material (pus), it may be infected; please notify us.    Wound healing continues for up to one year following surgery.  It is not unusual to experience pain in the scar from time to time during the interval.  If the pain becomes severe or the scar thickens, you should notify the office.  A slight amount of redness in a scar is expected for the first six months.  After six months, the redness subsides and the scar will soften and fade.  The color difference becomes less noticeable with time.  If there are any problems, return for a post-op surgery check at your earliest convenience.  Please call our office for any questions or  concerns.  Due to recent changes in healthcare laws, you may see results of your pathology and/or laboratory studies on MyChart before the doctors have had a chance to review them. We understand that in some cases there may be results that are confusing or concerning to you. Please understand that not all results are received at the same time and often the doctors may need to interpret multiple results in order to provide you with the best plan of care or course of treatment. Therefore, we ask that you please give [place] 2 business days to thoroughly review all your results before contacting the office for clarification. Should we see a critical lab result, you will be contacted sooner.   If You Need Anything After Your Visit  If you have any questions or concerns for your doctor, please call our main line at [phone] and press option 4 to reach your doctor's medical assistant. If no one answers, please leave a voicemail as directed and we will return your call as soon as possible. Messages left after 4 pm will be answered the following business day.   You may also send [place] a message via MyChart. We typically respond to MyChart messages within 1-2 business days.  For prescription refills, please ask your pharmacy to contact our office. Our fax number is [phone].  If you have an urgent issue when the clinic is closed that  cannot wait until the next business day, you can page your doctor at the number below.    Please note that while we do our best to be available for urgent issues outside of office hours, we are not available 24/7.   If you have an urgent issue and are unable to reach [place], you may choose to seek medical care at your doctor's office, retail clinic, urgent care center, or emergency room.  If you have a medical emergency, please immediately call 911 or go to the emergency department.  Pager Numbers  - Dr. Gwen Pounds: [phone]  - Dr. Roseanne Reno: [phone]  In the event of  inclement weather, please call our main line at [phone] for an update on the status of any delays or closures.  Dermatology Medication Tips: Please keep the boxes that topical medications come in in order to help keep track of the instructions about where and how to use these. Pharmacies typically print the medication instructions only on the boxes and not directly on the medication tubes.   If your medication is too expensive, please contact our office at [phone] option 4 or send [place] a message through MyChart.   We are unable to tell what your co-pay for medications will be in advance as this is different depending on your insurance coverage. However, we may be able to find a substitute medication at lower cost or fill out paperwork to get insurance to cover a needed medication.   If a prior authorization is required to get your medication covered by your insurance company, please allow [place] 1-2 business days to complete this process.  Drug prices often vary depending on where the prescription is filled and some pharmacies may offer cheaper prices.  The website www.goodrx.com contains coupons for medications through different pharmacies. The prices here do not account for what the cost may be with help from insurance (it may be cheaper with your insurance), but the website can give you the price if you did not use any insurance.  - You can print the associated coupon and take it with your prescription to the pharmacy.  - You may also stop by our office during regular business hours and pick up a GoodRx coupon card.  - If you need your prescription sent electronically to a different pharmacy, notify our office through [facility] or by phone at [phone] option 4.     Si Usted Necesita Algo Despus de Su Visita  Tambin puede enviarnos un mensaje a travs de Clinical cytogeneticist. Por lo general respondemos a los mensajes de MyChart en el transcurso de 1 a 2 das hbiles.  Para renovar  recetas, por favor pida a su farmacia que se ponga en contacto con nuestra oficina. Annie Sable de fax es [place] [phone].  Si tiene un asunto urgente cuando la clnica est cerrada y que no puede esperar hasta el siguiente da hbil, puede llamar/localizar a su doctor(a) al nmero que aparece a continuacin.   Por favor, tenga en cuenta que aunque hacemos todo lo posible para estar disponibles para asuntos urgentes fuera del horario de [place], no estamos disponibles las 24 horas del da, los 7 [address]  [address] de la [place].   Si tiene un problema urgente y no puede comunicarse con nosotros, puede optar por buscar atencin mdica  en el consultorio de su doctor(a), en una clnica privada, en un centro de atencin urgente o en una sala de emergencias.  Si tiene una emergencia mdica, por favor llame inmediatamente al 911 o  vaya a la sala de emergencias.  Nmeros de bper  - Dr. Gwen Pounds: [phone]  - Dra. Roseanne Reno: [phone]  En caso de inclemencias del [place], por favor llame a Lacy Duverney principal al [phone] para una actualizacin sobre el [place] de cualquier retraso o cierre.  Consejos para la medicacin en dermatologa: Por favor, guarde las cajas en las que vienen los medicamentos de uso tpico para ayudarle a seguir las instrucciones sobre dnde y cmo usarlos. Las farmacias generalmente imprimen las instrucciones del medicamento slo en las cajas y no directamente en los tubos del [place].   Si su medicamento es muy caro, por favor, pngase en contacto con Rolm Gala llamando al [phone] y presione la opcin 4 o envenos un mensaje a travs de Clinical cytogeneticist.   No podemos decirle cul ser su copago por los medicamentos por adelantado ya que esto es diferente dependiendo de la cobertura de su seguro. Sin embargo, es posible que podamos encontrar un medicamento sustituto a Audiological scientist un formulario para que el seguro cubra el medicamento que se considera necesario.    Si se requiere una autorizacin previa para que su compaa de seguros [place] su medicamento, por favor permtanos de 1 a 2 das hbiles para completar [address]  Los precios de los medicamentos varan con frecuencia dependiendo del Environmental consultant de dnde se surte la receta y alguna farmacias pueden ofrecer precios ms baratos.  El sitio web www.goodrx.com tiene cupones para medicamentos de Health and safety inspector. Los precios aqu no tienen en cuenta lo que podra costar con la ayuda del seguro (puede ser ms barato con su seguro), pero el sitio web puede darle el precio si no utiliz Tourist information centre manager.  - Puede imprimir el cupn correspondiente y llevarlo con su receta a la farmacia.  - Tambin puede pasar por nuestra oficina durante el horario de atencin regular y Education officer, museum una tarjeta de cupones de GoodRx.  - Si necesita que su receta se enve electrnicamente a una farmacia diferente, informe a nuestra oficina a travs de MyChart de [place] o por telfono llamando al [phone] y presione la opcin 4.

## 2023-03-26 NOTE — Progress Notes (Signed)
Follow-Up Visit   Subjective  Joann Warren is a 77 y.o. female who presents for the following: Several thick spots of lowers legs that are tender.  The patient has spots, moles and lesions to be evaluated, some may be new or changing and the patient may have concern these could be cancer.   The following portions of the chart were reviewed this encounter and updated as appropriate: medications, allergies, medical history  Review of Systems:  No other skin or systemic complaints except as noted in HPI or Assessment and Plan.  Objective  Well appearing patient in no apparent distress; mood and affect are within normal limits.   A focused examination was performed of the following areas: Legs  Relevant exam findings are noted in the Assessment and Plan.  Left ant lat ankle Hyperkeratotic papule     Left medial lower leg above ankle Hyperkeratotic papule     Right lat ankle sup Hyperkeratotic papule     Right lat ankle middle Hyperkeratotic papule  Right lat ankle inf Hyperkeratotic papule    Assessment & Plan   ACTINIC DAMAGE - chronic, secondary to cumulative UV radiation exposure/sun exposure over time - diffuse scaly erythematous macules with underlying dyspigmentation - Recommend daily broad spectrum sunscreen SPF 30+ to sun-exposed areas, reapply every 2 hours as needed.  - Recommend staying in the shade or wearing long sleeves, sun glasses (UVA+UVB protection) and wide brim hats (4-inch brim around the entire circumference of the hat). - Call for new or changing lesions.   Neoplasm of uncertain behavior of skin (5) Left ant lat ankle  Epidermal / dermal shaving  Lesion diameter (cm):  2.1 Informed consent: discussed and consent obtained   Timeout: patient name, date of birth, surgical site, and procedure verified   Procedure prep:  Patient was prepped and draped in usual sterile fashion Prep type:  Isopropyl alcohol Anesthesia: the lesion  was anesthetized in a standard fashion   Anesthetic:  1% lidocaine w/ epinephrine 1-100,000 buffered w/ 8.4% NaHCO3 Instrument used: flexible razor blade   Hemostasis achieved with: pressure, aluminum chloride and electrodesiccation   Outcome: patient tolerated procedure well   Post-procedure details: sterile dressing applied and wound care instructions given   Dressing type: bandage and petrolatum    Destruction of lesion Complexity: extensive   Destruction method: electrodesiccation and curettage   Informed consent: discussed and consent obtained   Timeout:  patient name, date of birth, surgical site, and procedure verified Procedure prep:  Patient was prepped and draped in usual sterile fashion Prep type:  Isopropyl alcohol Anesthesia: the lesion was anesthetized in a standard fashion   Anesthetic:  1% lidocaine w/ epinephrine 1-100,000 buffered w/ 8.4% NaHCO3 Curettage performed in three different directions: Yes   Electrodesiccation performed over the curetted area: Yes   Final wound size (cm):  2.1 Hemostasis achieved with:  pressure and aluminum chloride Outcome: patient tolerated procedure well with no complications   Post-procedure details: sterile dressing applied and wound care instructions given   Dressing type: bandage and petrolatum    Specimen 1 - Surgical pathology Differential Diagnosis: SCC vs other  Check Margins: No EDC today  Left medial lower leg above ankle  Epidermal / dermal shaving  Lesion diameter (cm):  1.7 Informed consent: discussed and consent obtained   Timeout: patient name, date of birth, surgical site, and procedure verified   Procedure prep:  Patient was prepped and draped in usual sterile fashion Prep type:  Isopropyl alcohol Anesthesia:  the lesion was anesthetized in a standard fashion   Anesthetic:  1% lidocaine w/ epinephrine 1-100,000 buffered w/ 8.4% NaHCO3 Instrument used: flexible razor blade   Hemostasis achieved with: pressure,  aluminum chloride and electrodesiccation   Outcome: patient tolerated procedure well   Post-procedure details: sterile dressing applied and wound care instructions given   Dressing type: bandage and petrolatum    Destruction of lesion Complexity: extensive   Destruction method: electrodesiccation and curettage   Informed consent: discussed and consent obtained   Timeout:  patient name, date of birth, surgical site, and procedure verified Procedure prep:  Patient was prepped and draped in usual sterile fashion Prep type:  Isopropyl alcohol Anesthesia: the lesion was anesthetized in a standard fashion   Anesthetic:  1% lidocaine w/ epinephrine 1-100,000 buffered w/ 8.4% NaHCO3 Curettage performed in three different directions: Yes   Electrodesiccation performed over the curetted area: Yes   Final wound size (cm):  1.7 Hemostasis achieved with:  pressure and aluminum chloride Outcome: patient tolerated procedure well with no complications   Post-procedure details: sterile dressing applied and wound care instructions given   Dressing type: bandage and petrolatum    Specimen 2 - Surgical pathology Differential Diagnosis: SCC vs other  Check Margins: No EDC today  Right lat ankle sup  Epidermal / dermal shaving  Lesion diameter (cm):  1.5 Informed consent: discussed and consent obtained   Timeout: patient name, date of birth, surgical site, and procedure verified   Procedure prep:  Patient was prepped and draped in usual sterile fashion Prep type:  Isopropyl alcohol Anesthesia: the lesion was anesthetized in a standard fashion   Anesthetic:  1% lidocaine w/ epinephrine 1-100,000 buffered w/ 8.4% NaHCO3 Instrument used: flexible razor blade   Hemostasis achieved with: pressure, aluminum chloride and electrodesiccation   Outcome: patient tolerated procedure well   Post-procedure details: sterile dressing applied and wound care instructions given   Dressing type: bandage and  petrolatum    Destruction of lesion Complexity: extensive   Destruction method: electrodesiccation and curettage   Informed consent: discussed and consent obtained   Timeout:  patient name, date of birth, surgical site, and procedure verified Procedure prep:  Patient was prepped and draped in usual sterile fashion Prep type:  Isopropyl alcohol Anesthesia: the lesion was anesthetized in a standard fashion   Anesthetic:  1% lidocaine w/ epinephrine 1-100,000 buffered w/ 8.4% NaHCO3 Curettage performed in three different directions: Yes   Electrodesiccation performed over the curetted area: Yes   Final wound size (cm):  1.5 Hemostasis achieved with:  pressure and aluminum chloride Outcome: patient tolerated procedure well with no complications   Post-procedure details: sterile dressing applied and wound care instructions given   Dressing type: bandage and petrolatum    Specimen 3 - Surgical pathology Differential Diagnosis: SCC vs other  Check Margins: No EDC today  Right lat ankle middle  Epidermal / dermal shaving  Lesion diameter (cm):  1.5 Informed consent: discussed and consent obtained   Timeout: patient name, date of birth, surgical site, and procedure verified   Procedure prep:  Patient was prepped and draped in usual sterile fashion Prep type:  Isopropyl alcohol Anesthesia: the lesion was anesthetized in a standard fashion   Anesthetic:  1% lidocaine w/ epinephrine 1-100,000 buffered w/ 8.4% NaHCO3 Instrument used: flexible razor blade   Hemostasis achieved with: pressure, aluminum chloride and electrodesiccation   Outcome: patient tolerated procedure well   Post-procedure details: sterile dressing applied and wound care instructions given  Dressing type: bandage and petrolatum    Destruction of lesion Complexity: extensive   Destruction method: electrodesiccation and curettage   Informed consent: discussed and consent obtained   Timeout:  patient name, date of  birth, surgical site, and procedure verified Procedure prep:  Patient was prepped and draped in usual sterile fashion Prep type:  Isopropyl alcohol Anesthesia: the lesion was anesthetized in a standard fashion   Anesthetic:  1% lidocaine w/ epinephrine 1-100,000 buffered w/ 8.4% NaHCO3 Curettage performed in three different directions: Yes   Electrodesiccation performed over the curetted area: Yes   Final wound size (cm):  1.5 Hemostasis achieved with:  pressure and aluminum chloride Outcome: patient tolerated procedure well with no complications   Post-procedure details: sterile dressing applied and wound care instructions given   Dressing type: bandage and petrolatum    Specimen 4 - Surgical pathology Differential Diagnosis: SCC vs other  Check Margins: No EDC today  Right lat ankle inf  Epidermal / dermal shaving  Lesion diameter (cm):  1.5 Informed consent: discussed and consent obtained   Timeout: patient name, date of birth, surgical site, and procedure verified   Procedure prep:  Patient was prepped and draped in usual sterile fashion Prep type:  Isopropyl alcohol Anesthesia: the lesion was anesthetized in a standard fashion   Anesthetic:  1% lidocaine w/ epinephrine 1-100,000 buffered w/ 8.4% NaHCO3 Instrument used: flexible razor blade   Hemostasis achieved with: pressure, aluminum chloride and electrodesiccation   Outcome: patient tolerated procedure well   Post-procedure details: sterile dressing applied and wound care instructions given   Dressing type: bandage and petrolatum    Destruction of lesion Complexity: extensive   Destruction method: electrodesiccation and curettage   Informed consent: discussed and consent obtained   Timeout:  patient name, date of birth, surgical site, and procedure verified Procedure prep:  Patient was prepped and draped in usual sterile fashion Prep type:  Isopropyl alcohol Anesthesia: the lesion was anesthetized in a standard  fashion   Anesthetic:  1% lidocaine w/ epinephrine 1-100,000 buffered w/ 8.4% NaHCO3 Curettage performed in three different directions: Yes   Electrodesiccation performed over the curetted area: Yes   Final wound size (cm):  1.5 Hemostasis achieved with:  pressure and aluminum chloride Outcome: patient tolerated procedure well with no complications   Post-procedure details: sterile dressing applied and wound care instructions given   Dressing type: bandage and petrolatum    Specimen 5 - Surgical pathology Differential Diagnosis: SCC vs other  Check Margins: No EDC today  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  - Call if any new or changing lesions are noted between office visits   HISTORY OF BASAL CELL CARCINOMA OF THE SKIN - No evidence of recurrence today - Recommend regular full body skin exams - Recommend daily broad spectrum sunscreen SPF 30+ to sun-exposed areas, reapply every 2 hours as needed.  - Call if any new or changing lesions are noted between office visits   Return in about 3 months (around 06/26/2023) for Follow up.  I, Joanie Coddington, CMA, am acting as scribe for Armida Sans, MD .   Documentation: I have reviewed the above documentation for accuracy and completeness, and I agree with the above.  Armida Sans, MD

## 2023-04-01 ENCOUNTER — Encounter: Payer: Self-pay | Admitting: Dermatology

## 2023-04-01 ENCOUNTER — Telehealth: Payer: Self-pay

## 2023-04-01 NOTE — Telephone Encounter (Signed)
Left message for patient to call office for results/hd 

## 2023-04-01 NOTE — Telephone Encounter (Signed)
-----   Message from Armida Sans sent at 03/31/2023  6:08 PM EDT ----- Diagnosis 1. Skin , left ant lat ankle WELL DIFFERENTIATED SQUAMOUS CELL CARCINOMA 2. Skin , left medial lower leg above ankle WELL DIFFERENTIATED SQUAMOUS CELL CARCINOMA WITH SUPERFICIAL INFILTRATION 3. Skin , right lat ankle sup WELL DIFFERENTIATED SQUAMOUS CELL CARCINOMA 4. 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 Skin , right lat ankle inf WELL DIFFERENTIATED SQUAMOUS CELL CARCINOMA  1,2,3,4,5 - all are Cancer = SCC All were already treated Recheck next visit

## 2023-04-02 NOTE — Telephone Encounter (Signed)
Patient advised of BX results in person. aw

## 2023-04-24 ENCOUNTER — Other Ambulatory Visit: Payer: Self-pay | Admitting: Nurse Practitioner

## 2023-04-24 DIAGNOSIS — Z760 Encounter for issue of repeat prescription: Secondary | ICD-10-CM

## 2023-04-28 ENCOUNTER — Ambulatory Visit (INDEPENDENT_AMBULATORY_CARE_PROVIDER_SITE_OTHER): Payer: PPO

## 2023-04-28 DIAGNOSIS — E538 Deficiency of other specified B group vitamins: Secondary | ICD-10-CM | POA: Diagnosis not present

## 2023-04-28 MED ORDER — CYANOCOBALAMIN 1000 MCG/ML IJ SOLN
1000.0000 ug | Freq: Once | INTRAMUSCULAR | Status: AC
Start: 2023-04-28 — End: 2023-04-28
  Administered 2023-04-28: 1000 ug via INTRAMUSCULAR

## 2023-05-06 ENCOUNTER — Ambulatory Visit (INDEPENDENT_AMBULATORY_CARE_PROVIDER_SITE_OTHER): Payer: PPO | Admitting: Nurse Practitioner

## 2023-05-06 ENCOUNTER — Encounter: Payer: Self-pay | Admitting: Nurse Practitioner

## 2023-05-06 VITALS — BP 126/72 | HR 90 | Temp 98.2°F | Resp 16 | Ht 62.0 in | Wt 107.6 lb

## 2023-05-06 DIAGNOSIS — Z0000 Encounter for general adult medical examination without abnormal findings: Secondary | ICD-10-CM | POA: Diagnosis not present

## 2023-05-06 DIAGNOSIS — N1832 Chronic kidney disease, stage 3b: Secondary | ICD-10-CM

## 2023-05-06 DIAGNOSIS — R300 Dysuria: Secondary | ICD-10-CM | POA: Diagnosis not present

## 2023-05-06 DIAGNOSIS — E782 Mixed hyperlipidemia: Secondary | ICD-10-CM | POA: Diagnosis not present

## 2023-05-06 DIAGNOSIS — M810 Age-related osteoporosis without current pathological fracture: Secondary | ICD-10-CM

## 2023-05-06 DIAGNOSIS — N182 Chronic kidney disease, stage 2 (mild): Secondary | ICD-10-CM | POA: Diagnosis not present

## 2023-05-06 DIAGNOSIS — I10 Essential (primary) hypertension: Secondary | ICD-10-CM

## 2023-05-06 DIAGNOSIS — Z760 Encounter for issue of repeat prescription: Secondary | ICD-10-CM

## 2023-05-06 DIAGNOSIS — Z0001 Encounter for general adult medical examination with abnormal findings: Secondary | ICD-10-CM

## 2023-05-06 MED ORDER — BISOPROLOL-HYDROCHLOROTHIAZIDE 2.5-6.25 MG PO TABS
1.0000 | ORAL_TABLET | Freq: Every morning | ORAL | 3 refills | Status: DC
Start: 2023-05-06 — End: 2023-07-20

## 2023-05-06 MED ORDER — SIMVASTATIN 20 MG PO TABS
20.0000 mg | ORAL_TABLET | Freq: Every day | ORAL | 3 refills | Status: DC
Start: 2023-05-06 — End: 2024-05-02

## 2023-05-06 MED ORDER — ALENDRONATE SODIUM 70 MG PO TABS: ORAL_TABLET | ORAL | 4 refills | Status: DC

## 2023-05-06 NOTE — Progress Notes (Signed)
[facility] [address] [place], [place] [address]  Internal MEDICINE  Office Visit Note  Patient Name: Joann Warren  [id number]  [id number]  Date of Service: 05/06/2023  Chief Complaint  Patient presents with   Hyperlipidemia   Hypertension   Medicare Wellness    HPI Zuzanna presents for an annual well visit and physical exam.  Well-appearing 77 y.o. female/female with [PMH]  Routine CRC screening: Routine mammogram: due in December this year  DEXA scan: can have done again  Pap smear: Eye exam and/or foot exam: Labs:  New or worsening pain: Other concerns: Refills        05/06/2023    9:02 AM 04/30/2022    9:11 AM 04/26/2021   10:07 AM  MMSE - Mini Mental State Exam  Orientation to time 5 5 5   Orientation to Place 5 5 5   Registration 3 3 3   Attention/ Calculation 5 5 5   Recall 3 3 3   Language- name 2 objects 2 2 2   Language- repeat 1 1 1   Language- follow 3 step command 3 3 3   Language- read & follow direction 1 1 1   Write a sentence 1 1 1   Copy design 1 1 1   Total score 30 30 30     Functional Status Survey: Is the patient deaf or have difficulty hearing?: Yes Does the patient have difficulty seeing, even when wearing glasses/contacts?: No Does the patient have difficulty concentrating, remembering, or making decisions?: No Does the patient have difficulty walking or climbing stairs?: No Does the patient have difficulty dressing or bathing?: No Does the patient have difficulty doing errands alone such as visiting a doctor's office or shopping?: No     04/09/2020    3:34 PM 12/07/2020   10:08 AM 04/26/2021   10:05 AM 04/30/2022    9:09 AM 05/06/2023    9:01 AM  Fall Risk  Falls in the past year?  0 0 0 0  Was there an injury with Fall?    0 0  Fall Risk Category Calculator    0 0  Fall Risk Category (Retired)    Low   (RETIRED) Patient Fall Risk Level Low fall risk  Low fall risk Low fall risk   Patient at Risk for Falls Due to   No Fall  Risks No Fall Risks No Fall Risks  Fall risk Follow up   Falls evaluation completed Falls evaluation completed Falls evaluation completed       05/06/2023    9:01 AM  Depression screen PHQ 2/9  Decreased Interest 0  Down, Depressed, Hopeless 0  PHQ - 2 Score 0        Current Medication: Outpatient Encounter Medications as of 05/06/2023  Medication Sig   amLODipine (NORVASC) 5 MG tablet TAKE 1 TABLET BY MOUTH ONCE DAILY   bimatoprost (LUMIGAN) 0.03 % ophthalmic solution    hydrOXYzine (ATARAX) 10 MG tablet TAKE ONE TABLET (10 MG) BY MOUTH EVERY DAY TO TWICE DAILY AS NEEDED FOR ITCHING   [DISCONTINUED] alendronate (FOSAMAX) 70 MG tablet TAKE 1 TABLET EVERY 7 DAYS WITH A FULL GLASS OF WATER ON AN EMPTY STOMACH DO NOT LIE DOWN FOR AT LEAST 30 MIN   [DISCONTINUED] bisoprolol-hydrochlorothiazide (ZIAC) 2.5-6.25 MG tablet Take 1 tablet by mouth every morning.   [DISCONTINUED] doxycycline (VIBRA-TABS) 100 MG tablet Take one tab po BID with food x 1 week.   [DISCONTINUED] mupirocin ointment (BACTROBAN) 2 % Apply 1 Application topically daily. Qd to wounds   [  DISCONTINUED] simvastatin (ZOCOR) 20 MG tablet Take 1 tablet (20 mg total) by mouth at bedtime.   [DISCONTINUED] triamcinolone (KENALOG) 0.1 % APPLY ONCE DAILY AS NEEDED   [DISCONTINUED] Zoster Vaccine Adjuvanted [facility]) injection Inject 0.5 mLs into the muscle once.   alendronate (FOSAMAX) 70 MG tablet TAKE 1 TABLET EVERY 7 DAYS WITH A FULL GLASS OF WATER ON AN EMPTY STOMACH DO NOT LIE DOWN FOR AT LEAST 30 MIN   bisoprolol-hydrochlorothiazide (ZIAC) 2.5-6.25 MG tablet Take 1 tablet by mouth every morning.   simvastatin (ZOCOR) 20 MG tablet Take 1 tablet (20 mg total) by mouth at bedtime.   No facility-administered encounter medications on file as of 05/06/2023.    Surgical History: Past Surgical History:  Procedure Laterality Date   BREAST EXCISIONAL BIOPSY Left 1972   neg   BREAST EXCISIONAL BIOPSY Right 1980   neg    COLONOSCOPY     ESOPHAGOGASTRODUODENOSCOPY (EGD) WITH PROPOFOL N/A 04/09/2015   Procedure: ESOPHAGOGASTRODUODENOSCOPY (EGD) WITH PROPOFOL;  Surgeon: Scot Jun, MD;  Location: [facility] ENDOSCOPY;  Service: Endoscopy;  Laterality: N/A;   skin cancer removal      Medical History: Past Medical History:  Diagnosis Date   Actinic keratosis    Anemia    Basal cell carcinoma 12/23/2007   Upper back.   Basal cell carcinoma 05/17/2009   Right med lower leg   Basal cell carcinoma 07/06/2014   Left medial breast   Basal cell carcinoma 03/01/2015   Right medial mid pretibial   Basal cell carcinoma 07/16/2015   Right superior breast   Basal cell carcinoma 05/21/2016   Left inferior medial breast   Basal cell carcinoma 02/08/2018   Left medial breast   Basal cell carcinoma 11/01/2018   Left medial breast   Basal cell carcinoma 03/30/2019   Left med inf breast   Basal cell carcinoma 09/05/2019   Left proximal bicep   Basal cell carcinoma 12/08/2019   Right forehead. Nodular pattern.   Basal cell carcinoma 12/13/2019   Left med. breast inf. Nodular and infiltrative patterns. EDC   Basal cell carcinoma 12/13/2019   Left med. breast. sup. Nodular pattern. EDC   Basal cell carcinoma 05/14/2020   Right upper back. BCC with focal sclerosis. EDC.   Basal cell carcinoma 12/25/2020   Left inf med breast EDC   Basal cell carcinoma 07/03/2021   Right bicep EDC   Basal cell carcinoma 06/10/2022   left inferior medial breast   Cancer (HCC)    squamous cell   Glaucoma    Hyperlipidemia    Hypertension    Osteoporosis    SCC (squamous cell carcinoma) 12/16/2021   R forearm, EDC   SCC (squamous cell carcinoma) 12/16/2021   L pretibial, EDC   SCC (squamous cell carcinoma) 04/23/2022   Left mid distal pretibial lat - EDC   SCC (squamous cell carcinoma) 04/23/2022   Left mid distal pretibial med - EDC   SCC (squamous cell carcinoma) 08/28/2022   L distal lat pretibial   SCC (squamous cell  carcinoma) 08/28/2022   L proximal pretibial   SCC (squamous cell carcinoma) 12/31/2022   R lower leg anterior, EDC   SCC (squamous cell carcinoma) 12/31/2022   L lower leg anterior, EDC   SCC (squamous cell carcinoma) 12/31/2022   L chest superior, EDC   SCC (squamous cell carcinoma) 12/31/2022   L chest inferior, EDC   Squamous cell carcinoma of skin 12/08/2007   Left lower leg. WD SCC  Squamous cell carcinoma of skin 02/01/2008   Left distal med. pretibial. WD SCC   Squamous cell carcinoma of skin 02/01/2008   Right mid pretibial. WD SCC   Squamous cell carcinoma of skin 02/01/2008   Right mid pretibal inferior. WD SCC with superficial infiltration.   Squamous cell carcinoma of skin 04/05/2008   Left dorsum hand. WD SCC with superficial infiltration.   Squamous cell carcinoma of skin 06/15/2008   Right lower leg inferior. WD SCC   Squamous cell carcinoma of skin 06/15/2008   Right lower leg superior. SCCis hypertrophic   Squamous cell carcinoma of skin 07/05/2008   Right lat. lower leg, Right lower leg, Left lower leg   Squamous cell carcinoma of skin 08/04/2008   Right ant. lower leg, Left med. lower leg   Squamous cell carcinoma of skin 09/01/2008   Right post. lower leg   Squamous cell carcinoma of skin 10/04/2008   Left post leg   Squamous cell carcinoma of skin 12/01/2008   Left lower leg   Squamous cell carcinoma of skin 03/08/2009   Right ant. mid lower leg, Right lower leg   Squamous cell carcinoma of skin 05/17/2009   Left lower post. leg superior   Squamous cell carcinoma of skin 10/11/2009   Left lower leg. KA-pattern   Squamous cell carcinoma of skin 03/28/2010   Right lower leg   Squamous cell carcinoma of skin 03/25/2013   Right lower leg   Squamous cell carcinoma of skin 06/23/2013   Right post. lower leg. Right medial lower leg. Left medial lower leg   Squamous cell carcinoma of skin 09/09/2013   Left anterior upper arm. Right anterior lower leg.  Right lat. lower leg. Right below knee lower leg   Squamous cell carcinoma of skin 12/08/2013   Right posterior lower leg sup. Right forearm   Squamous cell carcinoma of skin 03/03/2014   Right posterior leg. Right med. lower leg   Squamous cell carcinoma of skin 06/22/2014   Right to of shoulder anterior. Right top of shoulder posterior   Squamous cell carcinoma of skin 08/03/2014   Right anterior forearm   Squamous cell carcinoma of skin 09/14/2014   Right chest   Squamous cell carcinoma of skin 10/30/2014   Right calf   Squamous cell carcinoma of skin 12/14/2014   Right med. distal pretibial sup. Right med distal pretibial inf. Left proximal bicep sup. Left proximal bicep inf.   Squamous cell carcinoma of skin 03/01/2015   Left lat proximal pretibial near knee   Squamous cell carcinoma of skin 03/29/2015   Left mid to prox. pretibial. Left mid. lat. pretibial.    Squamous cell carcinoma of skin 04/19/2015   Right mid calf. Right distal lat. pretibial   Squamous cell carcinoma of skin 05/31/2015   Right proximal med. pretibial ant. Right proximal med. pretibial posterior   Squamous cell carcinoma of skin 07/16/2015   Right upper arm inf. deltoid   Squamous cell carcinoma of skin 08/06/2015   Left distal pretibial   Squamous cell carcinoma of skin 09/12/2015   Left postauricular area   Squamous cell carcinoma of skin 12/12/2015   Left proximal pretibial x4 sites   Squamous cell carcinoma of skin 12/31/2015   Right med. lower leg above ankle sup. Right med. lower leg above ankle inf. Left inf. lat. calf med. Left inf. calf lat.   Squamous cell carcinoma of skin 01/31/2016   Right med. sup. ankle area. Right med mid proximal calf.  Left dorsum foot.    Squamous cell carcinoma of skin 03/17/2016   Left post. distal calf lateral. Left post distal calf med. Left mid lat ant. thigh. Right mid lat. calf   Squamous cell carcinoma of skin 05/21/2016   Right proximal medial pretibial.  Right mid med. pretibial.   Squamous cell carcinoma of skin 07/03/2016   Right mid med. pretibial. Right proximal med. pretibial   Squamous cell carcinoma of skin 08/20/2016   Right medial calf. Left distal pretibial. Right upper back paraspinal   Squamous cell carcinoma of skin 10/30/2016   Right medial calf x2   Squamous cell carcinoma of skin 12/10/2016   left mid back 6.0cm lat to spine. Left sup. lat. calf. Left inf. post. calf   Squamous cell carcinoma of skin 12/25/2016   Right medial above ankle. Right mid pretibial. Right prox. pretibial. Right prox. pretibial med.    Squamous cell carcinoma of skin 01/19/2017   Left forearm. Left ant. neck   Squamous cell carcinoma of skin 03/23/2017   Left med. pretibial below knee   Squamous cell carcinoma of skin 04/13/2017   Right lat. sup. ankle. Right medial ankle   Squamous cell carcinoma of skin 04/27/2017   Left med. distal prox. calf. Left medial distal calf. Left lat. distal prox. calf. Left lat distal distal calf   Squamous cell carcinoma of skin 05/14/2017   Left bicep. Left dorsum mid forearm   Squamous cell carcinoma of skin 06/25/2017   Right med. distal calf x3   Squamous cell carcinoma of skin 09/02/2017   Left med. infraclavicular   Squamous cell carcinoma of skin 12/21/2017   Left prox. med. lower leg. Right forearm. Left forearm. Left bicep   Squamous cell carcinoma of skin 01/07/2018   Left dorsum foot prox. Left dorsum foot distal. Left mid to distal calf. Right chest   Squamous cell carcinoma of skin 03/08/2018   Left bicep. Right proximal dorsum forearm. Right distal med. pretibial sup. Right distal med pretibial inf.    Squamous cell carcinoma of skin 04/05/2018   Right sup. med scapula   Squamous cell carcinoma of skin 06/03/2018   Left med. ankle sup. Left med. ankle inf. Left lateral ankle.   Squamous cell carcinoma of skin 09/01/2018   Right mid lat. calf. Right lat. knee. Prox post calf   Squamous cell  carcinoma of skin 11/01/2018   Left lateral ankle   Squamous cell carcinoma of skin 12/08/2018   Right lateral ankle lat malleolus   Squamous cell carcinoma of skin 01/05/2019   Right lateral elbow. Left mid medial calf. Left prox. ant. thigh. Right distal lat tricep   Squamous cell carcinoma of skin 01/19/2019   Right popliteal   Squamous cell carcinoma of skin 02/03/2019   Left mid lat calf. Left mid calf. Left dorsum lat distal foot. Left dorsum distal foot   Squamous cell carcinoma of skin 03/30/2019   Right lower leg above med ankle ant. Right lower leg above ankle sup. Right lower leg above the med ankle inf.   Squamous cell carcinoma of skin 05/12/2019   Right chest medial. Left deltoid   Squamous cell carcinoma of skin 06/23/2019   Right ant thigh distal above knee. Left med distal thigh. Left ant thigh above knee   Squamous cell carcinoma of skin 07/20/2019   Right chest. Right dorsal hand.   Squamous cell carcinoma of skin 08/03/2019   Left distal lat pretibial. Left distal med calf. Right prox.  lat calf   Squamous cell carcinoma of skin 09/05/2019   Right middle bicep. Right sup lat bicep. Right sup mid bicep. Right sup med bicep   Squamous cell carcinoma of skin 09/15/2019   Right proximal med pretibial. Left distal lat thigh   Squamous cell carcinoma of skin 04/02/2020   Right calf, bleow right medial knee sup, below right medial knee post, below right medial knee inf   Squamous cell carcinoma of skin 05/14/2020   Right ant. shoulder. Mod. Diff. SCC. EDC   Squamous cell carcinoma of skin 06/27/2020   R mid to distal pretibial - ED&C   Squamous cell carcinoma of skin 06/27/2020   Below the R knee - ED&C    Squamous cell carcinoma of skin 06/27/2020   R prox pretibial    Squamous cell carcinoma of skin 06/27/2020   L distal med calf   Squamous cell carcinoma of skin 08/06/2020   Left ant deltoid   Squamous cell carcinoma of skin 12/25/2020   Left lat ankle EDC    Squamous cell carcinoma of skin 03/27/2021   left knee, EDC   Squamous cell carcinoma of skin 03/27/2021   left lateral leg, EDC   Squamous cell carcinoma of skin 07/03/2021   Left lat neck - EDC   Squamous cell carcinoma of skin 07/03/2021   Right ant thigh - EDC   Squamous cell carcinoma of skin 07/03/2021   Right prox lat pretibial - EDC   Squamous cell carcinoma of skin 10/16/2021   right med mid calf, EDC   Squamous cell carcinoma of skin 10/16/2021   left medial prox pretibial, EDC   Squamous cell carcinoma of skin 10/16/2021   left distal pretibial, EDC   Squamous cell carcinoma of skin 03/26/2023   Left ant lat ankle EDC   Squamous cell carcinoma of skin 03/26/2023   Left medial lower leg above ankle EDC EDC   Squamous cell carcinoma of skin 03/26/2023   Right lat ankle sup EDC   Squamous cell carcinoma of skin 03/26/2023   Right lat ankle middle EDC   Squamous cell carcinoma of skin 03/26/2023   Right lat ankle inf EDC    Family History: Family History  Problem Relation Age of Onset   Breast cancer Sister 42   Atrial fibrillation Mother     Social History   Socioeconomic History   Marital status: Married    Spouse name: Not on file   Number of children: Not on file   Years of education: Not on file   Highest education level: Not on file  Occupational History   Not on file  Tobacco Use   Smoking status: Never   Smokeless tobacco: Never  Vaping Use   Vaping status: Never Used  Substance and Sexual Activity   Alcohol use: No   Drug use: No   Sexual activity: Not on file  Other Topics Concern   Not on file  Social History Narrative   Not on file   Social Determinants of Health   Financial Resource Strain: Not on file  Food Insecurity: Not on file  Transportation Needs: Not on file  Physical Activity: Not on file  Stress: Not on file  Social Connections: Not on file  Intimate Partner Violence: Not on file      Review of Systems  Vital  Signs: BP 126/72   Pulse 90   Temp 98.2 F (36.8 C)   Resp 16   Ht 5\' 2"  (1.575  m)   Wt 107 lb 9.6 oz (48.8 kg)   SpO2 98%   BMI 19.68 kg/m    Physical Exam     Assessment/Plan:   1. Encounter for general adult medical examination with abnormal findings ***  2. Mixed hyperlipidemia *** - simvastatin (ZOCOR) 20 MG tablet; Take 1 tablet (20 mg total) by mouth at bedtime.  Dispense: 90 tablet; Refill: 3 - bisoprolol-hydrochlorothiazide (ZIAC) 2.5-6.25 MG tablet; Take 1 tablet by mouth every morning.  Dispense: 90 tablet; Refill: 3  3. Chronic renal disease, stage II ***  4. Senile osteoporosis *** - alendronate (FOSAMAX) 70 MG tablet; TAKE 1 TABLET EVERY 7 DAYS WITH A FULL GLASS OF WATER ON AN EMPTY STOMACH DO NOT LIE DOWN FOR AT LEAST 30 MIN  Dispense: 12 tablet; Refill: 4  5. Dysuria *** - UA/M w/rflx Culture, Routine        General Counseling: Ashantae verbalizes understanding of the findings of todays visit and agrees with plan of treatment. I have discussed any further diagnostic evaluation that may be needed or ordered today. We also reviewed her medications today. she has been encouraged to call the office with any questions or concerns that should arise related to todays visit.    No orders of the defined types were placed in this encounter.   Meds ordered this encounter  Medications   simvastatin (ZOCOR) 20 MG tablet    Sig: Take 1 tablet (20 mg total) by mouth at bedtime.    Dispense:  90 tablet    Refill:  3   bisoprolol-hydrochlorothiazide (ZIAC) 2.5-6.25 MG tablet    Sig: Take 1 tablet by mouth every morning.    Dispense:  90 tablet    Refill:  3    For future refills   alendronate (FOSAMAX) 70 MG tablet    Sig: TAKE 1 TABLET EVERY 7 DAYS WITH A FULL GLASS OF WATER ON AN EMPTY STOMACH DO NOT LIE DOWN FOR AT LEAST 30 MIN    Dispense:  12 tablet    Refill:  4    FORNEXT FILL    Return in about 1 year (around 05/05/2024) for annual medicare  wellness visit, with Chosen Garron pcp and otherwise as needed. .   Total time spent:*** Minutes Time spent includes review of chart, medications, test results, and follow up plan with the patient.   [place] Controlled Substance Database was reviewed by me.  This patient was seen by [name], FNP-C in collaboration with Dr. Beverely Risen as a part of collaborative care agreement.  Zhavia Cunanan R. Tedd Sias, MSN, FNP-C Internal medicine

## 2023-05-07 ENCOUNTER — Other Ambulatory Visit: Payer: Self-pay | Admitting: Nurse Practitioner

## 2023-05-07 DIAGNOSIS — Z0000 Encounter for general adult medical examination without abnormal findings: Secondary | ICD-10-CM | POA: Diagnosis not present

## 2023-05-07 DIAGNOSIS — N1832 Chronic kidney disease, stage 3b: Secondary | ICD-10-CM | POA: Diagnosis not present

## 2023-05-07 DIAGNOSIS — E782 Mixed hyperlipidemia: Secondary | ICD-10-CM | POA: Diagnosis not present

## 2023-05-08 LAB — COMPREHENSIVE METABOLIC PANEL
ALT: 8 IU/L (ref 0–32)
AST: 18 IU/L (ref 0–40)
Albumin: 4.1 g/dL (ref 3.8–4.8)
Alkaline Phosphatase: 63 IU/L (ref 44–121)
BUN/Creatinine Ratio: 17 (ref 12–28)
BUN: 21 mg/dL (ref 8–27)
Bilirubin Total: 0.5 mg/dL (ref 0.0–1.2)
CO2: 27 mmol/L (ref 20–29)
Calcium: 9.8 mg/dL (ref 8.7–10.3)
Chloride: 100 mmol/L (ref 96–106)
Creatinine, Ser: 1.26 mg/dL — ABNORMAL HIGH (ref 0.57–1.00)
Globulin, Total: 2.5 g/dL (ref 1.5–4.5)
Glucose: 115 mg/dL — ABNORMAL HIGH (ref 70–99)
Potassium: 4.4 mmol/L (ref 3.5–5.2)
Sodium: 140 mmol/L (ref 134–144)
Total Protein: 6.6 g/dL (ref 6.0–8.5)
eGFR: 44 mL/min/{1.73_m2} — ABNORMAL LOW (ref >59)

## 2023-05-08 LAB — UA/M W/RFLX CULTURE, ROUTINE
Bilirubin, UA: NEGATIVE
Glucose, UA: NEGATIVE
Ketones, UA: NEGATIVE
Nitrite, UA: NEGATIVE
Protein,UA: NEGATIVE
RBC, UA: NEGATIVE
Specific Gravity, UA: 1.012 (ref 1.005–1.030)
Urobilinogen, Ur: 0.2 mg/dL (ref 0.2–1.0)
pH, UA: 6 (ref 5.0–7.5)

## 2023-05-08 LAB — CBC WITH DIFFERENTIAL/PLATELET
Basophils Absolute: 0 10*3/uL (ref 0.0–0.2)
Basos: 1 %
EOS (ABSOLUTE): 0.5 10*3/uL — ABNORMAL HIGH (ref 0.0–0.4)
Eos: 7 %
Hematocrit: 39.4 % (ref 34.0–46.6)
Hemoglobin: 12.2 g/dL (ref 11.1–15.9)
Immature Grans (Abs): 0 10*3/uL (ref 0.0–0.1)
Immature Granulocytes: 0 %
Lymphocytes Absolute: 1.6 10*3/uL (ref 0.7–3.1)
Lymphs: 24 %
MCH: 28.5 pg (ref 26.6–33.0)
MCHC: 31 g/dL — ABNORMAL LOW (ref 31.5–35.7)
MCV: 92 fL (ref 79–97)
Monocytes Absolute: 0.5 10*3/uL (ref 0.1–0.9)
Monocytes: 8 %
Neutrophils Absolute: 3.9 10*3/uL (ref 1.4–7.0)
Neutrophils: 60 %
Platelets: 222 10*3/uL (ref 150–450)
RBC: 4.28 x10E6/uL (ref 3.77–5.28)
RDW: 13.1 % (ref 11.7–15.4)
WBC: 6.5 10*3/uL (ref 3.4–10.8)

## 2023-05-08 LAB — MICROSCOPIC EXAMINATION
Bacteria, UA: NONE SEEN
Casts: NONE SEEN /lpf
Epithelial Cells (non renal): NONE SEEN /hpf (ref 0–10)
RBC, Urine: NONE SEEN /hpf (ref 0–2)
WBC, UA: NONE SEEN /hpf (ref 0–5)

## 2023-05-08 LAB — LIPID PANEL
Chol/HDL Ratio: 2.5 ratio (ref 0.0–4.4)
Cholesterol, Total: 178 mg/dL (ref 100–199)
HDL: 71 mg/dL (ref >39)
LDL Chol Calc (NIH): 93 mg/dL (ref 0–99)
Triglycerides: 73 mg/dL (ref 0–149)
VLDL Cholesterol Cal: 14 mg/dL (ref 5–40)

## 2023-05-08 LAB — URINE CULTURE, REFLEX

## 2023-06-02 ENCOUNTER — Telehealth: Payer: Self-pay

## 2023-06-02 ENCOUNTER — Ambulatory Visit: Payer: PPO

## 2023-06-02 DIAGNOSIS — E538 Deficiency of other specified B group vitamins: Secondary | ICD-10-CM

## 2023-06-02 NOTE — Telephone Encounter (Signed)
Pt notified labs ordered.

## 2023-06-03 DIAGNOSIS — E538 Deficiency of other specified B group vitamins: Secondary | ICD-10-CM | POA: Diagnosis not present

## 2023-06-04 LAB — B12 AND FOLATE PANEL
Folate: 7 ng/mL (ref >3.0)
Vitamin B-12: 631 pg/mL (ref 232–1245)

## 2023-06-05 ENCOUNTER — Other Ambulatory Visit: Payer: Self-pay | Admitting: Dermatology

## 2023-06-05 DIAGNOSIS — L299 Pruritus, unspecified: Secondary | ICD-10-CM

## 2023-06-08 ENCOUNTER — Telehealth: Payer: Self-pay

## 2023-06-10 ENCOUNTER — Other Ambulatory Visit: Payer: Self-pay

## 2023-06-10 ENCOUNTER — Other Ambulatory Visit: Payer: Self-pay | Admitting: Nurse Practitioner

## 2023-06-10 DIAGNOSIS — L299 Pruritus, unspecified: Secondary | ICD-10-CM

## 2023-06-10 DIAGNOSIS — Z1231 Encounter for screening mammogram for malignant neoplasm of breast: Secondary | ICD-10-CM

## 2023-06-10 NOTE — Telephone Encounter (Signed)
Refill completed. aw

## 2023-06-10 NOTE — Progress Notes (Signed)
ERROR

## 2023-06-11 DIAGNOSIS — H903 Sensorineural hearing loss, bilateral: Secondary | ICD-10-CM | POA: Diagnosis not present

## 2023-06-16 NOTE — Telephone Encounter (Signed)
Patient was notified.

## 2023-06-27 NOTE — Progress Notes (Signed)
CBC is good Elevated fasting glucose 115 Abnormal kidney function -- cre 1.26, and egfr is 44, recommend renal artery ultrasound to evaluate kidney function based of change in labs. If agreable, let me know, will order it.  Cholesterol panel is back to normal  B12 and folate are normal

## 2023-06-29 ENCOUNTER — Telehealth: Payer: Self-pay

## 2023-06-29 ENCOUNTER — Encounter: Payer: Self-pay | Admitting: Nurse Practitioner

## 2023-06-29 ENCOUNTER — Other Ambulatory Visit: Payer: Self-pay | Admitting: Nurse Practitioner

## 2023-06-29 DIAGNOSIS — N182 Chronic kidney disease, stage 2 (mild): Secondary | ICD-10-CM

## 2023-06-29 NOTE — Telephone Encounter (Signed)
Left message for patient to give office a call back to go over her lab results.

## 2023-06-29 NOTE — Telephone Encounter (Signed)
Spoke with pt about labs result and send message to alyssa for Ultrasound  order

## 2023-06-29 NOTE — Telephone Encounter (Signed)
-----   Message from [facility] sent at 06/27/2023  9:36 AM EST ----- CBC is good Elevated fasting glucose 115 Abnormal kidney function -- cre 1.26, and egfr is 44, recommend renal artery ultrasound to evaluate kidney function based of change in labs. If agreable, let me know, will order it.  Cholesterol panel is back to normal  B12 and folate are normal

## 2023-06-30 ENCOUNTER — Telehealth: Payer: Self-pay

## 2023-06-30 NOTE — Telephone Encounter (Signed)
-----   Message from [name] sent at 06/29/2023  3:57 PM EST ----- Ok it is ordered   [place], I ordered renal artery ultrasound

## 2023-06-30 NOTE — Telephone Encounter (Signed)
Lmom to pt that we ordered ultrasound toni will call her back

## 2023-07-01 ENCOUNTER — Ambulatory Visit
Admission: RE | Admit: 2023-07-01 | Discharge: 2023-07-01 | Disposition: A | Discharge status: Home / Self Care | Payer: PPO | Source: Ambulatory Visit | Attending: Nurse Practitioner | Admitting: Nurse Practitioner

## 2023-07-01 DIAGNOSIS — N182 Chronic kidney disease, stage 2 (mild): Secondary | ICD-10-CM

## 2023-07-01 DIAGNOSIS — R945 Abnormal results of liver function studies: Secondary | ICD-10-CM | POA: Diagnosis not present

## 2023-07-02 ENCOUNTER — Ambulatory Visit: Payer: PPO | Admitting: Dermatology

## 2023-07-02 ENCOUNTER — Encounter: Payer: Self-pay | Admitting: Dermatology

## 2023-07-02 DIAGNOSIS — W908XXA Exposure to other nonionizing radiation, initial encounter: Secondary | ICD-10-CM | POA: Diagnosis not present

## 2023-07-02 DIAGNOSIS — D492 Neoplasm of unspecified behavior of bone, soft tissue, and skin: Secondary | ICD-10-CM

## 2023-07-02 DIAGNOSIS — C44722 Squamous cell carcinoma of skin of right lower limb, including hip: Secondary | ICD-10-CM

## 2023-07-02 DIAGNOSIS — Z85828 Personal history of other malignant neoplasm of skin: Secondary | ICD-10-CM

## 2023-07-02 DIAGNOSIS — Z7189 Other specified counseling: Secondary | ICD-10-CM

## 2023-07-02 DIAGNOSIS — Z8589 Personal history of malignant neoplasm of other organs and systems: Secondary | ICD-10-CM

## 2023-07-02 DIAGNOSIS — L578 Other skin changes due to chronic exposure to nonionizing radiation: Secondary | ICD-10-CM | POA: Diagnosis not present

## 2023-07-02 DIAGNOSIS — L570 Actinic keratosis: Secondary | ICD-10-CM | POA: Diagnosis not present

## 2023-07-02 NOTE — Patient Instructions (Addendum)

## 2023-07-02 NOTE — Progress Notes (Signed)
Follow-Up Visit   Subjective  Joann Warren is a 77 y.o. female who presents for the following: 3 month follow-up. She has several growths on the right lower leg and bilateral hands.   The patient has spots, moles and lesions to be evaluated, some may be new or changing and the patient may have concern these could be cancer.  The following portions of the chart were reviewed this encounter and updated as appropriate: medications, allergies, medical history  Review of Systems:  No other skin or systemic complaints except as noted in HPI or Assessment and Plan.  Objective  Well appearing patient in no apparent distress; mood and affect are within normal limits.  A focused examination was performed of the following areas: Legs, hands  Relevant physical exam findings are noted in the Assessment and Plan.  R middle pretibial sup medial 1.1cm keratotic pap   R middle pretibial middle 1.1cm keratotic pap  R middle pretibial inferior 1.2cm keratotic pap  R lower leg inf med knee 1.2cm keratotic pap  bil dorsum hands/ wrist x 15 (15) Hyperkeratotic scaly macules    Assessment & Plan   Neoplasm of skin (4) R middle pretibial sup medial  Epidermal / dermal shaving  Lesion diameter (cm):  1.1 Informed consent: discussed and consent obtained   Timeout: patient name, date of birth, surgical site, and procedure verified   Procedure prep:  Patient was prepped and draped in usual sterile fashion Prep type:  Isopropyl alcohol Anesthesia: the lesion was anesthetized in a standard fashion   Anesthetic:  1% lidocaine w/ epinephrine 1-100,000 buffered w/ 8.4% NaHCO3 Instrument used: flexible razor blade   Hemostasis achieved with: pressure, aluminum chloride and electrodesiccation   Outcome: patient tolerated procedure well   Post-procedure details: sterile dressing applied and wound care instructions given   Dressing type: bandage and bacitracin    Destruction of  lesion Complexity: extensive   Destruction method: electrodesiccation and curettage   Informed consent: discussed and consent obtained   Timeout:  patient name, date of birth, surgical site, and procedure verified Procedure prep:  Patient was prepped and draped in usual sterile fashion Prep type:  Isopropyl alcohol Anesthesia: the lesion was anesthetized in a standard fashion   Anesthetic:  1% lidocaine w/ epinephrine 1-100,000 buffered w/ 8.4% NaHCO3 Curettage performed in three different directions: Yes   Electrodesiccation performed over the curetted area: Yes   Lesion length (cm):  1.1 Lesion width (cm):  1.1 Margin per side (cm):  0.2 Final wound size (cm):  1.5 Hemostasis achieved with:  pressure, aluminum chloride and electrodesiccation Outcome: patient tolerated procedure well with no complications   Post-procedure details: sterile dressing applied and wound care instructions given   Dressing type: bandage and bacitracin    Specimen 1 - Surgical pathology Differential Diagnosis: D48.5 R/O SCC  Check Margins: yes 1.1cm keratotic pap EDC  R middle pretibial middle  Epidermal / dermal shaving  Lesion diameter (cm):  1.1 Informed consent: discussed and consent obtained   Timeout: patient name, date of birth, surgical site, and procedure verified   Procedure prep:  Patient was prepped and draped in usual sterile fashion Prep type:  Isopropyl alcohol Anesthesia: the lesion was anesthetized in a standard fashion   Anesthetic:  1% lidocaine w/ epinephrine 1-100,000 buffered w/ 8.4% NaHCO3 Instrument used: flexible razor blade   Hemostasis achieved with: pressure, aluminum chloride and electrodesiccation   Outcome: patient tolerated procedure well   Post-procedure details: sterile dressing applied and wound  care instructions given   Dressing type: bandage and bacitracin    Destruction of lesion Complexity: extensive   Destruction method: electrodesiccation and curettage    Informed consent: discussed and consent obtained   Timeout:  patient name, date of birth, surgical site, and procedure verified Procedure prep:  Patient was prepped and draped in usual sterile fashion Prep type:  Isopropyl alcohol Anesthesia: the lesion was anesthetized in a standard fashion   Anesthetic:  1% lidocaine w/ epinephrine 1-100,000 buffered w/ 8.4% NaHCO3 Curettage performed in three different directions: Yes   Electrodesiccation performed over the curetted area: Yes   Lesion length (cm):  1.1 Lesion width (cm):  1.1 Margin per side (cm):  0.2 Final wound size (cm):  1.5 Hemostasis achieved with:  pressure, aluminum chloride and electrodesiccation Outcome: patient tolerated procedure well with no complications   Post-procedure details: sterile dressing applied and wound care instructions given   Dressing type: bandage and bacitracin    Specimen 2 - Surgical pathology Differential Diagnosis: D48.5 R/O SCC  Check Margins: yes 1.1cm keratotic pap EDC  R middle pretibial inferior  Epidermal / dermal shaving  Lesion diameter (cm):  1.2 Informed consent: discussed and consent obtained   Timeout: patient name, date of birth, surgical site, and procedure verified   Procedure prep:  Patient was prepped and draped in usual sterile fashion Prep type:  Isopropyl alcohol Anesthesia: the lesion was anesthetized in a standard fashion   Anesthetic:  1% lidocaine w/ epinephrine 1-100,000 buffered w/ 8.4% NaHCO3 Instrument used: flexible razor blade   Hemostasis achieved with: pressure, aluminum chloride and electrodesiccation   Outcome: patient tolerated procedure well   Post-procedure details: sterile dressing applied and wound care instructions given   Dressing type: bandage and bacitracin    Destruction of lesion Complexity: extensive   Destruction method: electrodesiccation and curettage   Informed consent: discussed and consent obtained   Timeout:  patient name, date of  birth, surgical site, and procedure verified Procedure prep:  Patient was prepped and draped in usual sterile fashion Prep type:  Isopropyl alcohol Anesthesia: the lesion was anesthetized in a standard fashion   Anesthetic:  1% lidocaine w/ epinephrine 1-100,000 buffered w/ 8.4% NaHCO3 Curettage performed in three different directions: Yes   Electrodesiccation performed over the curetted area: Yes   Lesion length (cm):  1.2 Lesion width (cm):  1.2 Margin per side (cm):  0.2 Final wound size (cm):  1.6 Hemostasis achieved with:  pressure, aluminum chloride and electrodesiccation Outcome: patient tolerated procedure well with no complications   Post-procedure details: sterile dressing applied and wound care instructions given   Dressing type: bandage and bacitracin    Specimen 3 - Surgical pathology Differential Diagnosis: D48.5 R/O SCC  Check Margins: yes 1.2cm keratotic pap EDC  R lower leg inf med knee  Epidermal / dermal shaving  Lesion diameter (cm):  1.2 Informed consent: discussed and consent obtained   Timeout: patient name, date of birth, surgical site, and procedure verified   Procedure prep:  Patient was prepped and draped in usual sterile fashion Prep type:  Isopropyl alcohol Anesthesia: the lesion was anesthetized in a standard fashion   Anesthetic:  1% lidocaine w/ epinephrine 1-100,000 buffered w/ 8.4% NaHCO3 Instrument used: flexible razor blade   Hemostasis achieved with: pressure, aluminum chloride and electrodesiccation   Outcome: patient tolerated procedure well   Post-procedure details: sterile dressing applied and wound care instructions given   Dressing type: bandage and bacitracin    Destruction of lesion  Complexity: extensive   Destruction method: electrodesiccation and curettage   Informed consent: discussed and consent obtained   Timeout:  patient name, date of birth, surgical site, and procedure verified Procedure prep:  Patient was prepped and  draped in usual sterile fashion Prep type:  Isopropyl alcohol Anesthesia: the lesion was anesthetized in a standard fashion   Anesthetic:  1% lidocaine w/ epinephrine 1-100,000 buffered w/ 8.4% NaHCO3 Curettage performed in three different directions: Yes   Electrodesiccation performed over the curetted area: Yes   Lesion length (cm):  1.2 Lesion width (cm):  1.2 Margin per side (cm):  0.2 Final wound size (cm):  1.6 Hemostasis achieved with:  pressure, aluminum chloride and electrodesiccation Outcome: patient tolerated procedure well with no complications   Post-procedure details: sterile dressing applied and wound care instructions given   Dressing type: bandage and bacitracin    Specimen 4 - Surgical pathology Differential Diagnosis: D48.5 R/O SCC  Check Margins: yes 1.2cm keratotic pap EDC  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  Destruction of lesion - bil dorsum hands/ wrist x 15 (15) Complexity: simple   Destruction method: cryotherapy   Informed consent: discussed and consent obtained   Timeout:  patient name, date of birth, surgical site, and procedure verified Lesion destroyed using liquid nitrogen: Yes   Region frozen until ice ball extended beyond lesion: Yes   Outcome: patient tolerated procedure well with no complications   Post-procedure details: wound care instructions given    SCC (squamous cell carcinoma), leg, right  Counseling and coordination of care  Actinic skin damage  History of squamous cell carcinoma  History of basal cell carcinoma  ACTINIC DAMAGE - chronic, secondary to cumulative UV radiation exposure/sun exposure over time - diffuse scaly erythematous macules with underlying dyspigmentation - Recommend daily broad spectrum sunscreen SPF 30+ to sun-exposed areas, reapply every 2 hours as needed.  - Recommend staying in the shade or wearing long sleeves, sun glasses (UVA+UVB protection) and wide brim hats (4-inch  brim around the entire circumference of the hat). - Call for new or changing lesions.  HISTORY OF SQUAMOUS CELL CARCINOMA OF THE SKIN - No evidence of recurrence today - No lymphadenopathy - Recommend regular full body skin exams - Recommend daily broad spectrum sunscreen SPF 30+ to sun-exposed areas, reapply every 2 hours as needed.  - Call if any new or changing lesions are noted between office visits - Discussed patient is not a candidate for new oral treatment for patients with SCC  History of SCC (squamous cell carcinoma) of skin - Counseling today legs -multiple - see Problem list History of Squamous Cell Carcinoma of the Skin - Numerous, S/P radiation on the B/L lower legs Pt has had multiple;multiple SCCs treated in the past and we have done Radiation of both lower legs with Dr Rushie Chestnut at [facility] with some control and improvement, but she has persistent frequently occurring SCCs that we treat several new ones every few weeks.  These are surely sun damage related but may have HPV component too.  We have done "field treatments" in past with Photodynamic therapy with Levulan as well as "5-FU Chemowraps".   Because of the persistent issue, I sent patient for evaluation to [facility] - Oncology to see if she may be a candidate for CAPECITABINE oral (5-FU) chemotherapy - or other agent that patient may benefit from.  She saw Dr Cathie Hoops on 04/10/20 who did not recommend any systemic chemotherapy such as  Capecitabine, but advise if she had cancers that were refractory or metastatic, she could be a candidate for immunotherapy such as Cemiplimab. She discussed oral retinoids and oral nicotinamide options. She also discussed possible screening for immunocompromised state with testing for HIV; hepatitis and possible genetic counseling for genetic syndromes predisposing to [facility] formation.  More recently, in late 2024 I have discussed patient's with multiple squamous cell carcinomas that  I see in my practice with Dr. Donneta Romberg.  He did not feel these patients would meet criteria for systemic treatment. There is an article in the [place] Journal of Medicine volume 387 #17 pages 1557.  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 The patient's squamous cell carcinomas have all been stage I. This was all reviewed again and discussed with the patient today.   Pt has declined oral Retinoids.   She is advised to consider oral Nicotinamide options.[facility]) - may be purchased here in office = [employer].   HISTORY OF BASAL CELL CARCINOMA OF THE SKIN - No evidence of recurrence today - Recommend regular full body skin exams - Recommend daily broad spectrum sunscreen SPF 30+ to sun-exposed areas, reapply every 2 hours as needed.  - Call if any new or changing lesions are noted between office visits  Return in about 3 months (around 10/02/2023) for hx of SCC, AKs.  I, Sonya Hupman, RMA, am acting as scribe for Armida Sans, MD .   Documentation: I have reviewed the above documentation for accuracy and completeness, and I agree with the above.  Armida Sans, MD

## 2023-07-06 LAB — SURGICAL PATHOLOGY

## 2023-07-07 ENCOUNTER — Telehealth: Payer: Self-pay | Admitting: Nurse Practitioner

## 2023-07-07 ENCOUNTER — Telehealth: Payer: Self-pay

## 2023-07-07 NOTE — Telephone Encounter (Signed)
Lvm to schedule follow up to review u/s-Toni

## 2023-07-07 NOTE — Telephone Encounter (Addendum)
Tried calling patient regarding results. No answer, lm for patient to return call.    ----- Message from Armida Sans sent at 07/07/2023  8:56 AM EST ----- FINAL DIAGNOSIS       1. Skin, R middle pretibial sup medial :      WELL DIFFERENTIATED SQUAMOUS CELL CARCINOMA WITH SUPERFICIAL INFILTRATION, DEEP      MARGIN INVOLVED       2. Skin, R middle pretibial middle :      WELL DIFFERENTIATED SQUAMOUS CELL CARCINOMA, SEE DESCRIPTION       3. Skin, R middle pretibial inferior :      WELL DIFFERENTIATED SQUAMOUS CELL CARCINOMA, DEEP MARGIN INVOLVED       4. Skin, R lower leg inf med knee :      WELL DIFFERENTIATED SQUAMOUS CELL CARCINOMA, LIMITED MARGINS FREE  1,2,3,4 = all four are cancer = SCC All four already treated Recheck next visit

## 2023-07-07 NOTE — Telephone Encounter (Signed)
Patient advised and copy left at front desk for patient to pick up for insurance policy. aw

## 2023-07-20 ENCOUNTER — Encounter: Payer: Self-pay | Admitting: Internal Medicine

## 2023-07-20 ENCOUNTER — Ambulatory Visit (INDEPENDENT_AMBULATORY_CARE_PROVIDER_SITE_OTHER): Payer: PPO | Admitting: Internal Medicine

## 2023-07-20 ENCOUNTER — Telehealth: Payer: Self-pay | Admitting: Internal Medicine

## 2023-07-20 VITALS — BP 120/70 | HR 58 | Temp 98.6°F | Resp 16 | Ht 62.0 in | Wt 108.6 lb

## 2023-07-20 DIAGNOSIS — R7309 Other abnormal glucose: Secondary | ICD-10-CM

## 2023-07-20 DIAGNOSIS — I10 Essential (primary) hypertension: Secondary | ICD-10-CM

## 2023-07-20 DIAGNOSIS — R9431 Abnormal electrocardiogram [ECG] [EKG]: Secondary | ICD-10-CM

## 2023-07-20 DIAGNOSIS — R079 Chest pain, unspecified: Secondary | ICD-10-CM

## 2023-07-20 DIAGNOSIS — N2889 Other specified disorders of kidney and ureter: Secondary | ICD-10-CM

## 2023-07-20 LAB — POCT GLYCOSYLATED HEMOGLOBIN (HGB A1C): Hemoglobin A1C: 5.4 % (ref 4.0–5.6)

## 2023-07-20 NOTE — Telephone Encounter (Signed)
Awaiting 07/20/23 office notes for Cardiology referral-Toni

## 2023-07-20 NOTE — Progress Notes (Unsigned)
[facility] [address] [place], [place] [address]  Internal MEDICINE  Office Visit Note  Patient Name: Joann Warren  [id number]  [id number]  Date of Service: 07/21/2023  Chief Complaint  Patient presents with   Follow-up    Review U/S   Hypertension   Hyperlipidemia    HPI Pt is seen for follow up Baseline cr was found to be elevated, mildly elevated glucose  Renal artery us/ results are as follows  No Doppler evidence of significant renal artery stenosis.  Increased echogenicity of the renal cortices consistent with chronic medical renal disease 3. Found to be bradycardiac here in the office    Current Medication: Outpatient Encounter Medications as of 07/20/2023  Medication Sig   alendronate (FOSAMAX) 70 MG tablet TAKE 1 TABLET EVERY 7 DAYS WITH A FULL GLASS OF WATER ON AN EMPTY STOMACH DO NOT LIE DOWN FOR AT LEAST 30 MIN   amLODipine (NORVASC) 5 MG tablet TAKE 1 TABLET BY MOUTH ONCE DAILY   bimatoprost (LUMIGAN) 0.03 % ophthalmic solution    hydrOXYzine (ATARAX) 10 MG tablet TAKE ONE TABLET (10 MG) BY MOUTH EVERY DAY TO TWICE DAILY AS NEEDED FOR ITCHING   simvastatin (ZOCOR) 20 MG tablet Take 1 tablet (20 mg total) by mouth at bedtime.   [DISCONTINUED] bisoprolol-hydrochlorothiazide (ZIAC) 2.5-6.25 MG tablet Take 1 tablet by mouth every morning.   No facility-administered encounter medications on file as of 07/20/2023.    Surgical History: Past Surgical History:  Procedure Laterality Date   BREAST EXCISIONAL BIOPSY Left 1972   neg   BREAST EXCISIONAL BIOPSY Right 1980   neg   COLONOSCOPY     ESOPHAGOGASTRODUODENOSCOPY (EGD) WITH PROPOFOL N/A 04/09/2015   Procedure: ESOPHAGOGASTRODUODENOSCOPY (EGD) WITH PROPOFOL;  Surgeon: Scot Jun, MD;  Location: [facility] ENDOSCOPY;  Service: Endoscopy;  Laterality: N/A;   skin cancer removal      Medical History: Past Medical History:  Diagnosis Date   Actinic keratosis    Anemia    Basal cell carcinoma  12/23/2007   Upper back.   Basal cell carcinoma 05/17/2009   Right med lower leg   Basal cell carcinoma 07/06/2014   Left medial breast   Basal cell carcinoma 03/01/2015   Right medial mid pretibial   Basal cell carcinoma 07/16/2015   Right superior breast   Basal cell carcinoma 05/21/2016   Left inferior medial breast   Basal cell carcinoma 02/08/2018   Left medial breast   Basal cell carcinoma 11/01/2018   Left medial breast   Basal cell carcinoma 03/30/2019   Left med inf breast   Basal cell carcinoma 09/05/2019   Left proximal bicep   Basal cell carcinoma 12/08/2019   Right forehead. Nodular pattern.   Basal cell carcinoma 12/13/2019   Left med. breast inf. Nodular and infiltrative patterns. EDC   Basal cell carcinoma 12/13/2019   Left med. breast. sup. Nodular pattern. EDC   Basal cell carcinoma 05/14/2020   Right upper back. BCC with focal sclerosis. EDC.   Basal cell carcinoma 12/25/2020   Left inf med breast EDC   Basal cell carcinoma 07/03/2021   Right bicep EDC   Basal cell carcinoma 06/10/2022   left inferior medial breast   Cancer (HCC)    squamous cell   Glaucoma    Hyperlipidemia    Hypertension    Osteoporosis    SCC (squamous cell carcinoma) 12/16/2021   R forearm, EDC   SCC (squamous cell carcinoma) 12/16/2021   L pretibial,  EDC   SCC (squamous cell carcinoma) 04/23/2022   Left mid distal pretibial lat - EDC   SCC (squamous cell carcinoma) 04/23/2022   Left mid distal pretibial med - EDC   SCC (squamous cell carcinoma) 08/28/2022   L distal lat pretibial   SCC (squamous cell carcinoma) 08/28/2022   L proximal pretibial   SCC (squamous cell carcinoma) 12/31/2022   R lower leg anterior, EDC   SCC (squamous cell carcinoma) 12/31/2022   L lower leg anterior, EDC   SCC (squamous cell carcinoma) 12/31/2022   L chest superior, EDC   SCC (squamous cell carcinoma) 12/31/2022   L chest inferior, EDC   SCC (squamous cell carcinoma) 07/02/2023   right  middle pretibial sup medial tx EDC   SCC (squamous cell carcinoma) 07/02/2023   right middle pretibial middle tx with ED&C   SCC (squamous cell carcinoma) 07/02/2023   right middle pretibial inferior tx with ED&C   SCC (squamous cell carcinoma) 07/02/2023   right lower leg inferior medial knee tx with ED&C   Squamous cell carcinoma of skin 12/08/2007   Left lower leg. WD SCC   Squamous cell carcinoma of skin 02/01/2008   Left distal med. pretibial. WD SCC   Squamous cell carcinoma of skin 02/01/2008   Right mid pretibial. WD SCC   Squamous cell carcinoma of skin 02/01/2008   Right mid pretibal inferior. WD SCC with superficial infiltration.   Squamous cell carcinoma of skin 04/05/2008   Left dorsum hand. WD SCC with superficial infiltration.   Squamous cell carcinoma of skin 06/15/2008   Right lower leg inferior. WD SCC   Squamous cell carcinoma of skin 06/15/2008   Right lower leg superior. SCCis hypertrophic   Squamous cell carcinoma of skin 07/05/2008   Right lat. lower leg, Right lower leg, Left lower leg   Squamous cell carcinoma of skin 08/04/2008   Right ant. lower leg, Left med. lower leg   Squamous cell carcinoma of skin 09/01/2008   Right post. lower leg   Squamous cell carcinoma of skin 10/04/2008   Left post leg   Squamous cell carcinoma of skin 12/01/2008   Left lower leg   Squamous cell carcinoma of skin 03/08/2009   Right ant. mid lower leg, Right lower leg   Squamous cell carcinoma of skin 05/17/2009   Left lower post. leg superior   Squamous cell carcinoma of skin 10/11/2009   Left lower leg. KA-pattern   Squamous cell carcinoma of skin 03/28/2010   Right lower leg   Squamous cell carcinoma of skin 03/25/2013   Right lower leg   Squamous cell carcinoma of skin 06/23/2013   Right post. lower leg. Right medial lower leg. Left medial lower leg   Squamous cell carcinoma of skin 09/09/2013   Left anterior upper arm. Right anterior lower leg. Right lat. lower  leg. Right below knee lower leg   Squamous cell carcinoma of skin 12/08/2013   Right posterior lower leg sup. Right forearm   Squamous cell carcinoma of skin 03/03/2014   Right posterior leg. Right med. lower leg   Squamous cell carcinoma of skin 06/22/2014   Right to of shoulder anterior. Right top of shoulder posterior   Squamous cell carcinoma of skin 08/03/2014   Right anterior forearm   Squamous cell carcinoma of skin 09/14/2014   Right chest   Squamous cell carcinoma of skin 10/30/2014   Right calf   Squamous cell carcinoma of skin 12/14/2014   Right med. distal pretibial  sup. Right med distal pretibial inf. Left proximal bicep sup. Left proximal bicep inf.   Squamous cell carcinoma of skin 03/01/2015   Left lat proximal pretibial near knee   Squamous cell carcinoma of skin 03/29/2015   Left mid to prox. pretibial. Left mid. lat. pretibial.    Squamous cell carcinoma of skin 04/19/2015   Right mid calf. Right distal lat. pretibial   Squamous cell carcinoma of skin 05/31/2015   Right proximal med. pretibial ant. Right proximal med. pretibial posterior   Squamous cell carcinoma of skin 07/16/2015   Right upper arm inf. deltoid   Squamous cell carcinoma of skin 08/06/2015   Left distal pretibial   Squamous cell carcinoma of skin 09/12/2015   Left postauricular area   Squamous cell carcinoma of skin 12/12/2015   Left proximal pretibial x4 sites   Squamous cell carcinoma of skin 12/31/2015   Right med. lower leg above ankle sup. Right med. lower leg above ankle inf. Left inf. lat. calf med. Left inf. calf lat.   Squamous cell carcinoma of skin 01/31/2016   Right med. sup. ankle area. Right med mid proximal calf. Left dorsum foot.    Squamous cell carcinoma of skin 03/17/2016   Left post. distal calf lateral. Left post distal calf med. Left mid lat ant. thigh. Right mid lat. calf   Squamous cell carcinoma of skin 05/21/2016   Right proximal medial pretibial. Right mid med.  pretibial.   Squamous cell carcinoma of skin 07/03/2016   Right mid med. pretibial. Right proximal med. pretibial   Squamous cell carcinoma of skin 08/20/2016   Right medial calf. Left distal pretibial. Right upper back paraspinal   Squamous cell carcinoma of skin 10/30/2016   Right medial calf x2   Squamous cell carcinoma of skin 12/10/2016   left mid back 6.0cm lat to spine. Left sup. lat. calf. Left inf. post. calf   Squamous cell carcinoma of skin 12/25/2016   Right medial above ankle. Right mid pretibial. Right prox. pretibial. Right prox. pretibial med.    Squamous cell carcinoma of skin 01/19/2017   Left forearm. Left ant. neck   Squamous cell carcinoma of skin 03/23/2017   Left med. pretibial below knee   Squamous cell carcinoma of skin 04/13/2017   Right lat. sup. ankle. Right medial ankle   Squamous cell carcinoma of skin 04/27/2017   Left med. distal prox. calf. Left medial distal calf. Left lat. distal prox. calf. Left lat distal distal calf   Squamous cell carcinoma of skin 05/14/2017   Left bicep. Left dorsum mid forearm   Squamous cell carcinoma of skin 06/25/2017   Right med. distal calf x3   Squamous cell carcinoma of skin 09/02/2017   Left med. infraclavicular   Squamous cell carcinoma of skin 12/21/2017   Left prox. med. lower leg. Right forearm. Left forearm. Left bicep   Squamous cell carcinoma of skin 01/07/2018   Left dorsum foot prox. Left dorsum foot distal. Left mid to distal calf. Right chest   Squamous cell carcinoma of skin 03/08/2018   Left bicep. Right proximal dorsum forearm. Right distal med. pretibial sup. Right distal med pretibial inf.    Squamous cell carcinoma of skin 04/05/2018   Right sup. med scapula   Squamous cell carcinoma of skin 06/03/2018   Left med. ankle sup. Left med. ankle inf. Left lateral ankle.   Squamous cell carcinoma of skin 09/01/2018   Right mid lat. calf. Right lat. knee. Prox post calf  Squamous cell carcinoma of skin  11/01/2018   Left lateral ankle   Squamous cell carcinoma of skin 12/08/2018   Right lateral ankle lat malleolus   Squamous cell carcinoma of skin 01/05/2019   Right lateral elbow. Left mid medial calf. Left prox. ant. thigh. Right distal lat tricep   Squamous cell carcinoma of skin 01/19/2019   Right popliteal   Squamous cell carcinoma of skin 02/03/2019   Left mid lat calf. Left mid calf. Left dorsum lat distal foot. Left dorsum distal foot   Squamous cell carcinoma of skin 03/30/2019   Right lower leg above med ankle ant. Right lower leg above ankle sup. Right lower leg above the med ankle inf.   Squamous cell carcinoma of skin 05/12/2019   Right chest medial. Left deltoid   Squamous cell carcinoma of skin 06/23/2019   Right ant thigh distal above knee. Left med distal thigh. Left ant thigh above knee   Squamous cell carcinoma of skin 07/20/2019   Right chest. Right dorsal hand.   Squamous cell carcinoma of skin 08/03/2019   Left distal lat pretibial. Left distal med calf. Right prox. lat calf   Squamous cell carcinoma of skin 09/05/2019   Right middle bicep. Right sup lat bicep. Right sup mid bicep. Right sup med bicep   Squamous cell carcinoma of skin 09/15/2019   Right proximal med pretibial. Left distal lat thigh   Squamous cell carcinoma of skin 04/02/2020   Right calf, bleow right medial knee sup, below right medial knee post, below right medial knee inf   Squamous cell carcinoma of skin 05/14/2020   Right ant. shoulder. Mod. Diff. SCC. EDC   Squamous cell carcinoma of skin 06/27/2020   R mid to distal pretibial - ED&C   Squamous cell carcinoma of skin 06/27/2020   Below the R knee - ED&C    Squamous cell carcinoma of skin 06/27/2020   R prox pretibial    Squamous cell carcinoma of skin 06/27/2020   L distal med calf   Squamous cell carcinoma of skin 08/06/2020   Left ant deltoid   Squamous cell carcinoma of skin 12/25/2020   Left lat ankle EDC   Squamous cell  carcinoma of skin 03/27/2021   left knee, EDC   Squamous cell carcinoma of skin 03/27/2021   left lateral leg, EDC   Squamous cell carcinoma of skin 07/03/2021   Left lat neck - EDC   Squamous cell carcinoma of skin 07/03/2021   Right ant thigh - EDC   Squamous cell carcinoma of skin 07/03/2021   Right prox lat pretibial - EDC   Squamous cell carcinoma of skin 10/16/2021   right med mid calf, EDC   Squamous cell carcinoma of skin 10/16/2021   left medial prox pretibial, EDC   Squamous cell carcinoma of skin 10/16/2021   left distal pretibial, EDC   Squamous cell carcinoma of skin 03/26/2023   Left ant lat ankle EDC   Squamous cell carcinoma of skin 03/26/2023   Left medial lower leg above ankle EDC EDC   Squamous cell carcinoma of skin 03/26/2023   Right lat ankle sup EDC   Squamous cell carcinoma of skin 03/26/2023   Right lat ankle middle EDC   Squamous cell carcinoma of skin 03/26/2023   Right lat ankle inf EDC    Family History: Family History  Problem Relation Age of Onset   Breast cancer Sister 19   Atrial fibrillation Mother     Social History  Socioeconomic History   Marital status: Married    Spouse name: Not on file   Number of children: Not on file   Years of education: Not on file   Highest education level: Not on file  Occupational History   Not on file  Tobacco Use   Smoking status: Never   Smokeless tobacco: Never  Vaping Use   Vaping status: Never Used  Substance and Sexual Activity   Alcohol use: No   Drug use: No   Sexual activity: Not on file  Other Topics Concern   Not on file  Social History Narrative   Not on file   Social Determinants of Health   Financial Resource Strain: Not on file  Food Insecurity: Not on file  Transportation Needs: Not on file  Physical Activity: Not on file  Stress: Not on file  Social Connections: Not on file  Intimate Partner Violence: Not on file      Review of Systems  Constitutional:  Negative  for fatigue and fever.  HENT:  Negative for congestion, mouth sores and postnasal drip.   Respiratory:  Negative for cough.   Cardiovascular:  Negative for chest pain.  Genitourinary:  Negative for flank pain.  Psychiatric/Behavioral: Negative.      Vital Signs: BP 120/70   Pulse (!) 58   Temp 98.6 F (37 C)   Resp 16   Ht 5\' 2"  (1.575 m)   Wt 108 lb 9.6 oz (49.3 kg)   SpO2 94%   BMI 19.86 kg/m    Physical Exam Constitutional:      Appearance: Normal appearance.  HENT:     Head: Normocephalic and atraumatic.     Nose: Nose normal.     Mouth/Throat:     Mouth: Mucous membranes are moist.     Pharynx: No posterior oropharyngeal erythema.  Eyes:     Extraocular Movements: Extraocular movements intact.     Pupils: Pupils are equal, round, and reactive to light.  Cardiovascular:     Rate and Rhythm: Bradycardia present. Rhythm irregular.     Pulses: Normal pulses.     Heart sounds: Normal heart sounds.  Pulmonary:     Effort: Pulmonary effort is normal.     Breath sounds: Normal breath sounds.  Neurological:     General: No focal deficit present.     Mental Status: She is alert.  Psychiatric:        Mood and Affect: Mood normal.        Behavior: Behavior normal.        Assessment/Plan: 1. Abnormal EKG Her cardiac auscultation and EKG both are abnormal, DC bisoprolol and refer to cardiology  - EKG 12-Lead - Ambulatory referral to Cardiology  2. Abnormal glucose Will monitor for now  - POCT HgB A1C reviewed   3. Benign hypertension BP is well controlled will continue on Norvasc     4. Renal vascular disease  will continue to monitor, stop bisoprolol, ?? Hypoperfusion due to bradycardia, monitor blood pressure at home if low will modify therapy   General Counseling: Carry verbalizes understanding of the findings of todays visit and agrees with plan of treatment. I have discussed any further diagnostic evaluation that may be needed or ordered today. We also  reviewed her medications today. she has been encouraged to call the office with any questions or concerns that should arise related to todays visit.    Orders Placed This Encounter  Procedures   Ambulatory referral to Cardiology  POCT HgB A1C   EKG 12-Lead    No orders of the defined types were placed in this encounter.   Total time spent:35 Minutes Time spent includes review of chart, medications, test results, and follow up plan with the patient.   [place] Controlled Substance Database was reviewed by me.   Dr Lyndon Code Internal medicine

## 2023-07-27 ENCOUNTER — Telehealth: Payer: Self-pay | Admitting: Nurse Practitioner

## 2023-07-27 ENCOUNTER — Ambulatory Visit
Admission: RE | Admit: 2023-07-27 | Discharge: 2023-07-27 | Disposition: A | Discharge status: Home / Self Care | Payer: PPO | Source: Ambulatory Visit | Attending: Nurse Practitioner | Admitting: Nurse Practitioner

## 2023-07-27 DIAGNOSIS — Z1231 Encounter for screening mammogram for malignant neoplasm of breast: Secondary | ICD-10-CM | POA: Insufficient documentation

## 2023-07-27 NOTE — Telephone Encounter (Signed)
Cardiology referral sent via [facility] to [facility]

## 2023-08-03 ENCOUNTER — Telehealth: Payer: Self-pay | Admitting: Internal Medicine

## 2023-08-03 NOTE — Telephone Encounter (Signed)
Cardiovascular appointment 08/06/2023 @ [facility]

## 2023-08-04 ENCOUNTER — Other Ambulatory Visit: Payer: Self-pay | Admitting: Dermatology

## 2023-08-04 DIAGNOSIS — L299 Pruritus, unspecified: Secondary | ICD-10-CM

## 2023-08-06 DIAGNOSIS — R9431 Abnormal electrocardiogram [ECG] [EKG]: Secondary | ICD-10-CM | POA: Diagnosis not present

## 2023-08-06 DIAGNOSIS — E782 Mixed hyperlipidemia: Secondary | ICD-10-CM | POA: Diagnosis not present

## 2023-08-06 DIAGNOSIS — I10 Essential (primary) hypertension: Secondary | ICD-10-CM | POA: Diagnosis not present

## 2023-08-06 DIAGNOSIS — R000 Tachycardia, unspecified: Secondary | ICD-10-CM | POA: Diagnosis not present

## 2023-08-25 DIAGNOSIS — R000 Tachycardia, unspecified: Secondary | ICD-10-CM | POA: Diagnosis not present

## 2023-08-25 DIAGNOSIS — R9431 Abnormal electrocardiogram [ECG] [EKG]: Secondary | ICD-10-CM | POA: Diagnosis not present

## 2023-08-27 DIAGNOSIS — H2513 Age-related nuclear cataract, bilateral: Secondary | ICD-10-CM | POA: Diagnosis not present

## 2023-08-27 DIAGNOSIS — H401121 Primary open-angle glaucoma, left eye, mild stage: Secondary | ICD-10-CM | POA: Diagnosis not present

## 2023-08-27 DIAGNOSIS — H43813 Vitreous degeneration, bilateral: Secondary | ICD-10-CM | POA: Diagnosis not present

## 2023-08-27 DIAGNOSIS — H401112 Primary open-angle glaucoma, right eye, moderate stage: Secondary | ICD-10-CM | POA: Diagnosis not present

## 2023-09-16 DIAGNOSIS — I10 Essential (primary) hypertension: Secondary | ICD-10-CM | POA: Diagnosis not present

## 2023-09-16 DIAGNOSIS — E782 Mixed hyperlipidemia: Secondary | ICD-10-CM | POA: Diagnosis not present

## 2023-09-16 DIAGNOSIS — I5020 Unspecified systolic (congestive) heart failure: Secondary | ICD-10-CM | POA: Diagnosis not present

## 2023-10-01 DIAGNOSIS — I5020 Unspecified systolic (congestive) heart failure: Secondary | ICD-10-CM | POA: Diagnosis not present

## 2023-10-02 ENCOUNTER — Other Ambulatory Visit: Payer: Self-pay | Admitting: Dermatology

## 2023-10-02 DIAGNOSIS — L299 Pruritus, unspecified: Secondary | ICD-10-CM

## 2023-10-06 ENCOUNTER — Encounter: Payer: Self-pay | Admitting: Internal Medicine

## 2023-10-06 ENCOUNTER — Ambulatory Visit (INDEPENDENT_AMBULATORY_CARE_PROVIDER_SITE_OTHER): Payer: PPO | Admitting: Internal Medicine

## 2023-10-06 VITALS — BP 130/70 | HR 74 | Temp 98.4°F | Resp 16 | Ht 62.0 in | Wt 112.2 lb

## 2023-10-06 DIAGNOSIS — I10 Essential (primary) hypertension: Secondary | ICD-10-CM

## 2023-10-06 DIAGNOSIS — E782 Mixed hyperlipidemia: Secondary | ICD-10-CM | POA: Diagnosis not present

## 2023-10-06 DIAGNOSIS — I5020 Unspecified systolic (congestive) heart failure: Secondary | ICD-10-CM | POA: Diagnosis not present

## 2023-10-06 NOTE — Progress Notes (Signed)
[facility] [address] [place], [place] [address]  Internal MEDICINE  Office Visit Note  Patient Name: Joann Warren  [id number]  [id number]  Date of Service: 10/06/2023  Chief Complaint  Patient presents with   Follow-up   Hyperlipidemia   Hypertension    HPI Patient is seen for routine follow-up Patient has seen cardiology had echo and Myoview done, which showed heart failure with reduced EF, patient is managed by cardiology for this problem Denies any other complaint feeling better takes all medications as prescribed Bp is better     Current Medication: Outpatient Encounter Medications as of 10/06/2023  Medication Sig   alendronate (FOSAMAX) 70 MG tablet TAKE 1 TABLET EVERY 7 DAYS WITH A FULL GLASS OF WATER ON AN EMPTY STOMACH DO NOT LIE DOWN FOR AT LEAST 30 MIN   amLODipine (NORVASC) 5 MG tablet TAKE 1 TABLET BY MOUTH ONCE DAILY   Apoaequorin (PREVAGEN) 10 MG CAPS Take 10 mg by mouth daily.   bimatoprost (LUMIGAN) 0.01 % SOLN Place 1 drop into the right eye at bedtime.   empagliflozin (JARDIANCE) 10 MG TABS tablet Take 10 mg by mouth daily.   hydrOXYzine (ATARAX) 10 MG tablet TAKE ONE TABLET ONCE OR TWICE A DAY IF NEEDED FOR ITCH   ibandronate (BONIVA) 150 MG tablet Take 150 mg by mouth every 30 (thirty) days. Take in the morning with a full glass of water, on an empty stomach, and do not take anything else by mouth or lie down for the next 30 min.   losartan (COZAAR) 25 MG tablet Take 25 mg by mouth daily.   simvastatin (ZOCOR) 20 MG tablet Take 1 tablet (20 mg total) by mouth at bedtime.   [DISCONTINUED] bimatoprost (LUMIGAN) 0.03 % ophthalmic solution    [DISCONTINUED] metoprolol succinate (TOPROL-XL) 25 MG 24 hr tablet Take 25 mg by mouth daily.   No facility-administered encounter medications on file as of 10/06/2023.    Surgical History: Past Surgical History:  Procedure Laterality Date   BREAST EXCISIONAL BIOPSY Left 1972   neg   BREAST  EXCISIONAL BIOPSY Right 1980   neg   COLONOSCOPY     ESOPHAGOGASTRODUODENOSCOPY (EGD) WITH PROPOFOL N/A 04/09/2015   Procedure: ESOPHAGOGASTRODUODENOSCOPY (EGD) WITH PROPOFOL;  Surgeon: Scot Jun, MD;  Location: [facility] ENDOSCOPY;  Service: Endoscopy;  Laterality: N/A;   skin cancer removal      Medical History: Past Medical History:  Diagnosis Date   Actinic keratosis    Anemia    Basal cell carcinoma 12/23/2007   Upper back.   Basal cell carcinoma 05/17/2009   Right med lower leg   Basal cell carcinoma 07/06/2014   Left medial breast   Basal cell carcinoma 03/01/2015   Right medial mid pretibial   Basal cell carcinoma 07/16/2015   Right superior breast   Basal cell carcinoma 05/21/2016   Left inferior medial breast   Basal cell carcinoma 02/08/2018   Left medial breast   Basal cell carcinoma 11/01/2018   Left medial breast   Basal cell carcinoma 03/30/2019   Left med inf breast   Basal cell carcinoma 09/05/2019   Left proximal bicep   Basal cell carcinoma 12/08/2019   Right forehead. Nodular pattern.   Basal cell carcinoma 12/13/2019   Left med. breast inf. Nodular and infiltrative patterns. EDC   Basal cell carcinoma 12/13/2019   Left med. breast. sup. Nodular pattern. EDC   Basal cell carcinoma 05/14/2020   Right upper back. BCC with focal  sclerosis. EDC.   Basal cell carcinoma 12/25/2020   Left inf med breast EDC   Basal cell carcinoma 07/03/2021   Right bicep EDC   Basal cell carcinoma 06/10/2022   left inferior medial breast   Cancer (HCC)    squamous cell   Glaucoma    Hyperlipidemia    Hypertension    Osteoporosis    SCC (squamous cell carcinoma) 12/16/2021   R forearm, EDC   SCC (squamous cell carcinoma) 12/16/2021   L pretibial, EDC   SCC (squamous cell carcinoma) 04/23/2022   Left mid distal pretibial lat - EDC   SCC (squamous cell carcinoma) 04/23/2022   Left mid distal pretibial med - EDC   SCC (squamous cell carcinoma) 08/28/2022   L  distal lat pretibial   SCC (squamous cell carcinoma) 08/28/2022   L proximal pretibial   SCC (squamous cell carcinoma) 12/31/2022   R lower leg anterior, EDC   SCC (squamous cell carcinoma) 12/31/2022   L lower leg anterior, EDC   SCC (squamous cell carcinoma) 12/31/2022   L chest superior, EDC   SCC (squamous cell carcinoma) 12/31/2022   L chest inferior, EDC   SCC (squamous cell carcinoma) 07/02/2023   right middle pretibial sup medial tx EDC   SCC (squamous cell carcinoma) 07/02/2023   right middle pretibial middle tx with ED&C   SCC (squamous cell carcinoma) 07/02/2023   right middle pretibial inferior tx with ED&C   SCC (squamous cell carcinoma) 07/02/2023   right lower leg inferior medial knee tx with ED&C   Squamous cell carcinoma of skin 12/08/2007   Left lower leg. WD SCC   Squamous cell carcinoma of skin 02/01/2008   Left distal med. pretibial. WD SCC   Squamous cell carcinoma of skin 02/01/2008   Right mid pretibial. WD SCC   Squamous cell carcinoma of skin 02/01/2008   Right mid pretibal inferior. WD SCC with superficial infiltration.   Squamous cell carcinoma of skin 04/05/2008   Left dorsum hand. WD SCC with superficial infiltration.   Squamous cell carcinoma of skin 06/15/2008   Right lower leg inferior. WD SCC   Squamous cell carcinoma of skin 06/15/2008   Right lower leg superior. SCCis hypertrophic   Squamous cell carcinoma of skin 07/05/2008   Right lat. lower leg, Right lower leg, Left lower leg   Squamous cell carcinoma of skin 08/04/2008   Right ant. lower leg, Left med. lower leg   Squamous cell carcinoma of skin 09/01/2008   Right post. lower leg   Squamous cell carcinoma of skin 10/04/2008   Left post leg   Squamous cell carcinoma of skin 12/01/2008   Left lower leg   Squamous cell carcinoma of skin 03/08/2009   Right ant. mid lower leg, Right lower leg   Squamous cell carcinoma of skin 05/17/2009   Left lower post. leg superior   Squamous cell  carcinoma of skin 10/11/2009   Left lower leg. KA-pattern   Squamous cell carcinoma of skin 03/28/2010   Right lower leg   Squamous cell carcinoma of skin 03/25/2013   Right lower leg   Squamous cell carcinoma of skin 06/23/2013   Right post. lower leg. Right medial lower leg. Left medial lower leg   Squamous cell carcinoma of skin 09/09/2013   Left anterior upper arm. Right anterior lower leg. Right lat. lower leg. Right below knee lower leg   Squamous cell carcinoma of skin 12/08/2013   Right posterior lower leg sup. Right forearm  Squamous cell carcinoma of skin 03/03/2014   Right posterior leg. Right med. lower leg   Squamous cell carcinoma of skin 06/22/2014   Right to of shoulder anterior. Right top of shoulder posterior   Squamous cell carcinoma of skin 08/03/2014   Right anterior forearm   Squamous cell carcinoma of skin 09/14/2014   Right chest   Squamous cell carcinoma of skin 10/30/2014   Right calf   Squamous cell carcinoma of skin 12/14/2014   Right med. distal pretibial sup. Right med distal pretibial inf. Left proximal bicep sup. Left proximal bicep inf.   Squamous cell carcinoma of skin 03/01/2015   Left lat proximal pretibial near knee   Squamous cell carcinoma of skin 03/29/2015   Left mid to prox. pretibial. Left mid. lat. pretibial.    Squamous cell carcinoma of skin 04/19/2015   Right mid calf. Right distal lat. pretibial   Squamous cell carcinoma of skin 05/31/2015   Right proximal med. pretibial ant. Right proximal med. pretibial posterior   Squamous cell carcinoma of skin 07/16/2015   Right upper arm inf. deltoid   Squamous cell carcinoma of skin 08/06/2015   Left distal pretibial   Squamous cell carcinoma of skin 09/12/2015   Left postauricular area   Squamous cell carcinoma of skin 12/12/2015   Left proximal pretibial x4 sites   Squamous cell carcinoma of skin 12/31/2015   Right med. lower leg above ankle sup. Right med. lower leg above ankle inf.  Left inf. lat. calf med. Left inf. calf lat.   Squamous cell carcinoma of skin 01/31/2016   Right med. sup. ankle area. Right med mid proximal calf. Left dorsum foot.    Squamous cell carcinoma of skin 03/17/2016   Left post. distal calf lateral. Left post distal calf med. Left mid lat ant. thigh. Right mid lat. calf   Squamous cell carcinoma of skin 05/21/2016   Right proximal medial pretibial. Right mid med. pretibial.   Squamous cell carcinoma of skin 07/03/2016   Right mid med. pretibial. Right proximal med. pretibial   Squamous cell carcinoma of skin 08/20/2016   Right medial calf. Left distal pretibial. Right upper back paraspinal   Squamous cell carcinoma of skin 10/30/2016   Right medial calf x2   Squamous cell carcinoma of skin 12/10/2016   left mid back 6.0cm lat to spine. Left sup. lat. calf. Left inf. post. calf   Squamous cell carcinoma of skin 12/25/2016   Right medial above ankle. Right mid pretibial. Right prox. pretibial. Right prox. pretibial med.    Squamous cell carcinoma of skin 01/19/2017   Left forearm. Left ant. neck   Squamous cell carcinoma of skin 03/23/2017   Left med. pretibial below knee   Squamous cell carcinoma of skin 04/13/2017   Right lat. sup. ankle. Right medial ankle   Squamous cell carcinoma of skin 04/27/2017   Left med. distal prox. calf. Left medial distal calf. Left lat. distal prox. calf. Left lat distal distal calf   Squamous cell carcinoma of skin 05/14/2017   Left bicep. Left dorsum mid forearm   Squamous cell carcinoma of skin 06/25/2017   Right med. distal calf x3   Squamous cell carcinoma of skin 09/02/2017   Left med. infraclavicular   Squamous cell carcinoma of skin 12/21/2017   Left prox. med. lower leg. Right forearm. Left forearm. Left bicep   Squamous cell carcinoma of skin 01/07/2018   Left dorsum foot prox. Left dorsum foot distal. Left mid to distal calf.  Right chest   Squamous cell carcinoma of skin 03/08/2018   Left  bicep. Right proximal dorsum forearm. Right distal med. pretibial sup. Right distal med pretibial inf.    Squamous cell carcinoma of skin 04/05/2018   Right sup. med scapula   Squamous cell carcinoma of skin 06/03/2018   Left med. ankle sup. Left med. ankle inf. Left lateral ankle.   Squamous cell carcinoma of skin 09/01/2018   Right mid lat. calf. Right lat. knee. Prox post calf   Squamous cell carcinoma of skin 11/01/2018   Left lateral ankle   Squamous cell carcinoma of skin 12/08/2018   Right lateral ankle lat malleolus   Squamous cell carcinoma of skin 01/05/2019   Right lateral elbow. Left mid medial calf. Left prox. ant. thigh. Right distal lat tricep   Squamous cell carcinoma of skin 01/19/2019   Right popliteal   Squamous cell carcinoma of skin 02/03/2019   Left mid lat calf. Left mid calf. Left dorsum lat distal foot. Left dorsum distal foot   Squamous cell carcinoma of skin 03/30/2019   Right lower leg above med ankle ant. Right lower leg above ankle sup. Right lower leg above the med ankle inf.   Squamous cell carcinoma of skin 05/12/2019   Right chest medial. Left deltoid   Squamous cell carcinoma of skin 06/23/2019   Right ant thigh distal above knee. Left med distal thigh. Left ant thigh above knee   Squamous cell carcinoma of skin 07/20/2019   Right chest. Right dorsal hand.   Squamous cell carcinoma of skin 08/03/2019   Left distal lat pretibial. Left distal med calf. Right prox. lat calf   Squamous cell carcinoma of skin 09/05/2019   Right middle bicep. Right sup lat bicep. Right sup mid bicep. Right sup med bicep   Squamous cell carcinoma of skin 09/15/2019   Right proximal med pretibial. Left distal lat thigh   Squamous cell carcinoma of skin 04/02/2020   Right calf, bleow right medial knee sup, below right medial knee post, below right medial knee inf   Squamous cell carcinoma of skin 05/14/2020   Right ant. shoulder. Mod. Diff. SCC. EDC   Squamous cell  carcinoma of skin 06/27/2020   R mid to distal pretibial - ED&C   Squamous cell carcinoma of skin 06/27/2020   Below the R knee - ED&C    Squamous cell carcinoma of skin 06/27/2020   R prox pretibial    Squamous cell carcinoma of skin 06/27/2020   L distal med calf   Squamous cell carcinoma of skin 08/06/2020   Left ant deltoid   Squamous cell carcinoma of skin 12/25/2020   Left lat ankle EDC   Squamous cell carcinoma of skin 03/27/2021   left knee, EDC   Squamous cell carcinoma of skin 03/27/2021   left lateral leg, EDC   Squamous cell carcinoma of skin 07/03/2021   Left lat neck - EDC   Squamous cell carcinoma of skin 07/03/2021   Right ant thigh - EDC   Squamous cell carcinoma of skin 07/03/2021   Right prox lat pretibial - EDC   Squamous cell carcinoma of skin 10/16/2021   right med mid calf, EDC   Squamous cell carcinoma of skin 10/16/2021   left medial prox pretibial, EDC   Squamous cell carcinoma of skin 10/16/2021   left distal pretibial, EDC   Squamous cell carcinoma of skin 03/26/2023   Left ant lat ankle EDC   Squamous cell carcinoma of skin  03/26/2023   Left medial lower leg above ankle EDC EDC   Squamous cell carcinoma of skin 03/26/2023   Right lat ankle sup EDC   Squamous cell carcinoma of skin 03/26/2023   Right lat ankle middle EDC   Squamous cell carcinoma of skin 03/26/2023   Right lat ankle inf EDC    Family History: Family History  Problem Relation Age of Onset   Breast cancer Sister 22   Atrial fibrillation Mother     Social History   Socioeconomic History   Marital status: Married    Spouse name: Not on file   Number of children: Not on file   Years of education: Not on file   Highest education level: Not on file  Occupational History   Not on file  Tobacco Use   Smoking status: Never   Smokeless tobacco: Never  Vaping Use   Vaping status: Never Used  Substance and Sexual Activity   Alcohol use: No   Drug use: No   Sexual  activity: Not on file  Other Topics Concern   Not on file  Social History Narrative   Not on file   Social Drivers of Health   Financial Resource Strain: Not on file  Food Insecurity: Not on file  Transportation Needs: Not on file  Physical Activity: Not on file  Stress: Not on file  Social Connections: Not on file  Intimate Partner Violence: Not on file      Review of Systems  Constitutional:  Negative for fatigue and fever.  HENT:  Negative for congestion, mouth sores and postnasal drip.   Respiratory:  Negative for cough.   Cardiovascular:  Negative for chest pain.  Genitourinary:  Negative for flank pain.  Psychiatric/Behavioral: Negative.      Vital Signs: BP 130/70   Pulse 74   Temp 98.4 F (36.9 C)   Resp 16   Ht 5\' 2"  (1.575 m)   Wt 112 lb 3.2 oz (50.9 kg)   SpO2 99%   BMI 20.52 kg/m    Physical Exam Constitutional:      Appearance: Normal appearance.  HENT:     Head: Normocephalic and atraumatic.     Nose: Nose normal.     Mouth/Throat:     Mouth: Mucous membranes are moist.     Pharynx: No posterior oropharyngeal erythema.  Eyes:     Extraocular Movements: Extraocular movements intact.     Pupils: Pupils are equal, round, and reactive to light.  Cardiovascular:     Pulses: Normal pulses.     Heart sounds: Normal heart sounds.  Pulmonary:     Effort: Pulmonary effort is normal.     Breath sounds: Normal breath sounds.  Neurological:     General: No focal deficit present.     Mental Status: She is alert.  Psychiatric:        Mood and Affect: Mood normal.        Behavior: Behavior normal.        Assessment/Plan: 1. 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 Mixed hyperlipidemia Continue to monitor   3. Benign hypertension Continue to monitor, will recheck met b before next visit  - Basic Metabolic Panel (BMET)    General Counseling: Joann Warren verbalizes understanding of the findings of todays visit and agrees with plan of treatment. I have discussed any further diagnostic evaluation that may be needed  or ordered today. We also reviewed her medications today. she has been encouraged to call the office with any questions or concerns that should arise related to todays visit.    Orders Placed This Encounter  Procedures   Basic Metabolic Panel (BMET)      Total time spent:32 Minutes Time spent includes review of chart, medications, test results, and follow up plan with the patient.   [place] Controlled Substance Database was reviewed by me.   Dr Lyndon Code Internal medicine

## 2023-10-15 ENCOUNTER — Ambulatory Visit: Payer: PPO | Admitting: Dermatology

## 2023-10-15 ENCOUNTER — Encounter: Payer: Self-pay | Admitting: Dermatology

## 2023-10-15 DIAGNOSIS — Z7189 Other specified counseling: Secondary | ICD-10-CM

## 2023-10-15 DIAGNOSIS — C44729 Squamous cell carcinoma of skin of left lower limb, including hip: Secondary | ICD-10-CM | POA: Diagnosis not present

## 2023-10-15 DIAGNOSIS — Z85828 Personal history of other malignant neoplasm of skin: Secondary | ICD-10-CM

## 2023-10-15 DIAGNOSIS — Z872 Personal history of diseases of the skin and subcutaneous tissue: Secondary | ICD-10-CM

## 2023-10-15 DIAGNOSIS — L578 Other skin changes due to chronic exposure to nonionizing radiation: Secondary | ICD-10-CM

## 2023-10-15 DIAGNOSIS — C44722 Squamous cell carcinoma of skin of right lower limb, including hip: Secondary | ICD-10-CM | POA: Diagnosis not present

## 2023-10-15 DIAGNOSIS — D489 Neoplasm of uncertain behavior, unspecified: Secondary | ICD-10-CM

## 2023-10-15 DIAGNOSIS — Z8589 Personal history of malignant neoplasm of other organs and systems: Secondary | ICD-10-CM

## 2023-10-15 DIAGNOSIS — W098XXA Fall on or from other playground equipment, initial encounter: Secondary | ICD-10-CM | POA: Diagnosis not present

## 2023-10-15 DIAGNOSIS — D492 Neoplasm of unspecified behavior of bone, soft tissue, and skin: Secondary | ICD-10-CM | POA: Diagnosis not present

## 2023-10-15 MED ORDER — MUPIROCIN 2 % EX OINT
1.0000 | TOPICAL_OINTMENT | Freq: Every day | CUTANEOUS | 11 refills | Status: DC
Start: 2023-10-15 — End: 2023-10-28

## 2023-10-15 NOTE — Progress Notes (Unsigned)
 Follow-Up Visit   Subjective  Joann Warren is a 78 y.o. 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 Patient reports some painful spots 2 at left lower leg and 1 at right lower leg that she noticed after her last appointment.    The patient has spots, moles and lesions to be evaluated, some may be new or changing and the patient may have concern these could be cancer.  The following portions of the chart were reviewed this encounter and updated as appropriate: medications, allergies, medical history  Review of Systems:  No other skin or systemic complaints except as noted in HPI or Assessment and Plan.  Objective  Well appearing patient in no apparent distress; mood and affect are within normal limits.  A focused examination was performed of the following areas: Arms, hands, b/l legs  Relevant exam findings are noted in the Assessment and Plan.  left distal pretibial 3.5 cm keratotic papule   left proximal pretibial 2.2 cm keratotic papule   right middle medial lower leg 2.2 cm middle medial lower leg   Assessment & Plan   ACTINIC DAMAGE - chronic, secondary to cumulative UV radiation exposure/sun exposure over time - diffuse scaly erythematous macules with underlying dyspigmentation - Recommend daily broad spectrum sunscreen SPF 30+ to sun-exposed areas, reapply every 2 hours as needed.  - Recommend staying in the shade or wearing long sleeves, sun glasses (UVA+UVB protection) and wide brim hats (4-inch brim around the entire circumference of the hat). - Call for new or changing lesions.  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  - Call if any new or changing lesions are noted between office visits  History of SCC patient has history of 106 SCCs (mostly legs) that have been treated and 17 BCCs that have been treated.   (squamous cell carcinoma) of skin - Counseling today legs -multiple - see Problem list History of Squamous Cell Carcinoma of the Skin - Numerous, S/P radiation on the B/L lower legs Pt has had multiple;multiple SCCs treated in the past and we have done Radiation of both lower legs with Dr Rushie Chestnut at [facility] with some control and improvement, but she has persistent frequently occurring SCCs that we treat several new ones every few weeks.  These are surely sun damage related but may have HPV component too.  We have done "field treatments" in past with Photodynamic therapy with Levulan as well as "5-FU Chemowraps".   Because of the persistent issue, I sent patient for evaluation to [facility] to see if she may be a candidate for CAPECITABINE oral (5-FU) chemotherapy - or other agent that patient may benefit from.  She saw Dr Cathie Hoops on 04/10/20 who did not recommend any systemic chemotherapy such as Capecitabine, but advise if she had cancers that were refractory or metastatic, she could be a candidate for immunotherapy such as Cemiplimab. She discussed oral retinoids and oral nicotinamide options. She also discussed possible screening for immunocompromised state  with testing for HIV; hepatitis and possible genetic counseling for genetic syndromes predisposing to [facility] formation.   More recently, in late 2024 I have discussed patient's with multiple squamous cell carcinomas that I see in my practice with Dr. Donneta Romberg.  He did not feel these patients would meet criteria for systemic treatment. There is an  article in the [place] Journal of Medicine volume 387 #17 pages 1557.  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 The patient's squamous cell carcinomas have all been stage I. This was all reviewed again and discussed with the patient today.  Discussed treatment options with Dr. Katrinka Blazing today with patient. He did not have anything to add at this time.   Discussed consult with Dr. Kerrin Champagne at [facility] for evaluation - patient deferred today  Recommend NEW consult with Oncology to discuss any NEW treatment options.  Pt saw  Dr. Cathie Hoops in past.   Discussed etretinate vitamin a derivittive help decrease number of cancers. Side effects such as dry skin, chapped lips, muscle aches, joint aches and elevated cholesterol  She is advised to consider oral Nicotinamide options ([facility]) may be purchased here in office = HELIOCARE ADVANCE. Information given to patient and included in patient printout.   Extensive Counseling  NEOPLASM OF UNCERTAIN BEHAVIOR (3) left distal pretibial Epidermal / dermal shaving  Lesion diameter (cm):  3.5 Informed consent: discussed and consent obtained   Timeout: patient name, date of birth, surgical site, and procedure verified   Procedure prep:  Patient was prepped and draped in usual sterile fashion Prep type:  Isopropyl alcohol Anesthesia: the lesion was anesthetized in a standard fashion   Anesthetic:  1% lidocaine w/ epinephrine 1-100,000 buffered w/ 8.4% NaHCO3 Instrument used: flexible razor blade   Hemostasis achieved with: pressure, aluminum chloride and electrodesiccation   Outcome: patient tolerated procedure well   Post-procedure details: sterile dressing applied and wound care instructions given   Dressing type: bandage and petrolatum    Destruction of lesion Complexity: extensive   Destruction method: electrodesiccation and curettage   Informed consent: discussed and consent obtained   Timeout:   patient name, date of birth, surgical site, and procedure verified Procedure prep:  Patient was prepped and draped in usual sterile fashion Prep type:  Isopropyl alcohol Anesthesia: the lesion was anesthetized in a standard fashion   Anesthetic:  1% lidocaine w/ epinephrine 1-100,000 buffered w/ 8.4% NaHCO3 Curettage performed in three different directions: Yes   Electrodesiccation performed over the curetted area: Yes   Lesion length (cm):  3.5 Lesion width (cm):  3.5 Margin per side (cm):  0.2 Final wound size (cm):  3.9 Hemostasis achieved with:  pressure, aluminum chloride and electrodesiccation Outcome: patient tolerated procedure well with no complications   Post-procedure details: sterile dressing applied and wound care instructions given   Dressing type: bandage and petrolatum   Specimen 1 - Surgical pathology Differential Diagnosis: R/o scc   Check Margins: No  ED&C done today  left proximal pretibial Epidermal / dermal shaving  Lesion diameter (cm):  2.2 Informed consent: discussed and consent obtained   Timeout: patient name, date of birth, surgical site, and procedure verified   Procedure prep:  Patient was prepped and draped in usual sterile fashion Prep type:  Isopropyl alcohol Anesthesia: the lesion was anesthetized in a standard fashion   Anesthetic:  1% lidocaine w/ epinephrine 1-100,000 buffered w/ 8.4% NaHCO3 Instrument used: flexible razor blade   Hemostasis achieved with:  pressure, aluminum chloride and electrodesiccation   Outcome: patient tolerated procedure well   Post-procedure details: sterile dressing applied and wound care instructions given   Dressing type: bandage and petrolatum    Destruction of lesion Complexity: extensive   Destruction method: electrodesiccation and curettage   Informed consent: discussed and consent obtained   Timeout:  patient name, date of birth, surgical site, and procedure verified Procedure prep:  Patient was prepped  and draped in usual sterile fashion Prep type:  Isopropyl alcohol Anesthesia: the lesion was anesthetized in a standard fashion   Anesthetic:  1% lidocaine w/ epinephrine 1-100,000 buffered w/ 8.4% NaHCO3 Curettage performed in three different directions: Yes   Electrodesiccation performed over the curetted area: Yes   Lesion length (cm):  2.2 Lesion width (cm):  2.2 Margin per side (cm):  0.2 Final wound size (cm):  2.6 Hemostasis achieved with:  pressure, aluminum chloride and electrodesiccation Outcome: patient tolerated procedure well with no complications   Post-procedure details: sterile dressing applied and wound care instructions given   Dressing type: bandage and petrolatum   Specimen 2 - Surgical pathology Differential Diagnosis: r/o scc   Check Margins: No  ED&C done today right middle medial lower leg Epidermal / dermal shaving  Lesion diameter (cm):  2.2 Informed consent: discussed and consent obtained   Timeout: patient name, date of birth, surgical site, and procedure verified   Procedure prep:  Patient was prepped and draped in usual sterile fashion Prep type:  Isopropyl alcohol Anesthesia: the lesion was anesthetized in a standard fashion   Anesthetic:  1% lidocaine w/ epinephrine 1-100,000 buffered w/ 8.4% NaHCO3 Instrument used: flexible razor blade   Hemostasis achieved with: pressure, aluminum chloride and electrodesiccation   Outcome: patient tolerated procedure well   Post-procedure details: sterile dressing applied and wound care instructions given   Dressing type: bandage and petrolatum    Destruction of lesion Complexity: extensive   Destruction method: electrodesiccation and curettage   Informed consent: discussed and consent obtained   Timeout:  patient name, date of birth, surgical site, and procedure verified Procedure prep:  Patient was prepped and draped in usual sterile fashion Prep type:  Isopropyl alcohol Anesthesia: the lesion was  anesthetized in a standard fashion   Anesthetic:  1% lidocaine w/ epinephrine 1-100,000 buffered w/ 8.4% NaHCO3 Curettage performed in three different directions: Yes   Electrodesiccation performed over the curetted area: Yes   Lesion length (cm):  2.2 Lesion width (cm):  2.2 Margin per side (cm):  0.2 Final wound size (cm):  2.6 Hemostasis achieved with:  pressure, aluminum chloride and electrodesiccation Outcome: patient tolerated procedure well with no complications   Post-procedure details: sterile dressing applied and wound care instructions given   Dressing type: bandage and petrolatum   Specimen 3 - Surgical pathology Differential Diagnosis: r/o scc   Check Margins: No  ED&C done today  Shv and ED&C   R/o scc   NEOPLASM OF SKIN   Related Medications mupirocin ointment (BACTROBAN) 2 % Apply 1 Application topically daily. Qd to wounds ACTINIC SKIN DAMAGE   HISTORY OF SQUAMOUS CELL CARCINOMA   Related Procedures Ambulatory referral to Hematology / Oncology HISTORY OF BASAL CELL CARCINOMA   COUNSELING AND COORDINATION OF CARE   SCC (SQUAMOUS CELL CARCINOMA), LEG, RIGHT   SCC (SQUAMOUS CELL CARCINOMA), LEG, LEFT    Return for 3 - 4 month follow up hx of multiple scc/bcc .  IAsher Muir, CMA, am acting as scribe for Armida Sans, MD.  Documentation: I have reviewed the above documentation for accuracy and completeness, and I agree with the above.  Armida Sans, MD

## 2023-10-15 NOTE — Patient Instructions (Addendum)
 Nicotinamide -  is a vitamin B 3 supplement you can purchase online that can help prevent skin cancer.       - Recommend taking Heliocare sun protection supplement daily in sunny weather for additional sun protection. For maximum protection on the sunniest days, you can take up to 2 capsules of regular Heliocare OR take 1 capsule of Heliocare Ultra.      Electrodesiccation and Curettage ("Scrape and Burn") Wound Care Instructions  Leave the original bandage on for 24 hours if possible.  If the bandage becomes soaked or soiled before that time, it is OK to remove it and examine the wound.  A small amount of post-operative bleeding is normal.  If excessive bleeding occurs, remove the bandage, place gauze over the site and apply continuous pressure (no peeking) over the area for 30 minutes. If this does not work, please call our clinic as soon as possible or page your doctor if it is after hours.   Once a day, cleanse the wound with soap and water. It is fine to shower. If a thick crust develops you may use a Q-tip dipped into dilute hydrogen peroxide (mix 1:1 with water) to dissolve it.  Hydrogen peroxide can slow the healing process, so use it only as needed.    After washing, apply petroleum jelly (Vaseline) or an antibiotic ointment if your doctor prescribed one for you, followed by a bandage.    For best healing, the wound should be covered with a layer of ointment at all times. If you are not able to keep the area covered with a bandage to hold the ointment in place, this may mean re-applying the ointment several times a day.  Continue this wound care until the wound has healed and is no longer open. It may take several weeks for the wound to heal and close.  Itching and mild discomfort is normal during the healing process.  If you have any discomfort, you can take Tylenol (acetaminophen) or ibuprofen as directed on the bottle. (Please do not take these if you have an allergy to them or  cannot take them for another reason).  Some redness, tenderness and white or yellow material in the wound is normal healing.  If the area becomes very sore and red, or develops a thick yellow-green material (pus), it may be infected; please notify us.    Wound healing continues for up to one year following surgery. It is not unusual to experience pain in the scar from time to time during the interval.  If the pain becomes severe or the scar thickens, you should notify the office.    A slight amount of redness in a scar is expected for the first six months.  After six months, the redness will fade and the scar will soften and fade.  The color difference becomes less noticeable with time.  If there are any problems, return for a post-op surgery check at your earliest convenience.  To improve the appearance of the scar, you can use silicone scar gel, cream, or sheets (such as Mederma or Serica) every night for up to one year. These are available over the counter (without a prescription).  Please call our office at [phone] for any questions or concerns.  Biopsy Wound Care Instructions  Leave the original bandage on for 24 hours if possible.  If the bandage becomes soaked or soiled before that time, it is OK to remove it and examine the wound.  A small amount of  post-operative bleeding is normal.  If excessive bleeding occurs, remove the bandage, place gauze over the site and apply continuous pressure (no peeking) over the area for 30 minutes. If this does not work, please call our clinic as soon as possible or page your doctor if it is after hours.   Once a day, cleanse the wound with soap and water. It is fine to shower. If a thick crust develops you may use a Q-tip dipped into dilute hydrogen peroxide (mix 1:1 with water) to dissolve it.  Hydrogen peroxide can slow the healing process, so use it only as needed.    After washing, apply petroleum jelly (Vaseline) or an antibiotic ointment if your  doctor prescribed one for you, followed by a bandage.    For best healing, the wound should be covered with a layer of ointment at all times. If you are not able to keep the area covered with a bandage to hold the ointment in place, this may mean re-applying the ointment several times a day.  Continue this wound care until the wound has healed and is no longer open.   Itching and mild discomfort is normal during the healing process. However, if you develop pain or severe itching, please call our office.   If you have any discomfort, you can take Tylenol (acetaminophen) or ibuprofen as directed on the bottle. (Please do not take these if you have an allergy to them or cannot take them for another reason).  Some redness, tenderness and white or yellow material in the wound is normal healing.  If the area becomes very sore and red, or develops a thick yellow-green material (pus), it may be infected; please notify us.    If you have stitches, return to clinic as directed to have the stitches removed. You will continue wound care for 2-3 days after the stitches are removed.   Wound healing continues for up to one year following surgery. It is not unusual to experience pain in the scar from time to time during the interval.  If the pain becomes severe or the scar thickens, you should notify the office.    A slight amount of redness in a scar is expected for the first six months.  After six months, the redness will fade and the scar will soften and fade.  The color difference becomes less noticeable with time.  If there are any problems, return for a post-op surgery check at your earliest convenience.  To improve the appearance of the scar, you can use silicone scar gel, cream, or sheets (such as Mederma or Serica) every night for up to one year. These are available over the counter (without a prescription).  Please call our office at [phone] for any questions or concerns.       Due to recent  changes in healthcare laws, you may see results of your pathology and/or laboratory studies on MyChart before the doctors have had a chance to review them. We understand that in some cases there may be results that are confusing or concerning to you. Please understand that not all results are received at the same time and often the doctors may need to interpret multiple results in order to provide you with the best plan of care or course of treatment. Therefore, we ask that you please give [place] 2 business days to thoroughly review all your results before contacting the office for clarification. Should we see a critical lab result, you will be contacted sooner.  If You Need Anything After Your Visit  If you have any questions or concerns for your doctor, please call our main line at [phone] and press option 4 to reach your doctor's medical assistant. If no one answers, please leave a voicemail as directed and we will return your call as soon as possible. Messages left after 4 pm will be answered the following business day.   You may also send [place] a message via MyChart. We typically respond to MyChart messages within 1-2 business days.  For prescription refills, please ask your pharmacy to contact our office. Our fax number is [phone].  If you have an urgent issue when the clinic is closed that cannot wait until the next business day, you can page your doctor at the number below.    Please note that while we do our best to be available for urgent issues outside of office hours, we are not available 24/7.   If you have an urgent issue and are unable to reach [place], you may choose to seek medical care at your doctor's office, retail clinic, urgent care center, or emergency room.  If you have a medical emergency, please immediately call 911 or go to the emergency department.  Pager Numbers  - Dr. Gwen Pounds: [phone]  - Dr. Roseanne Reno: [phone]  - Dr. Katrinka Blazing: [phone]   In the event  of inclement weather, please call our main line at [phone] for an update on the status of any delays or closures.  Dermatology Medication Tips: Please keep the boxes that topical medications come in in order to help keep track of the instructions about where and how to use these. Pharmacies typically print the medication instructions only on the boxes and not directly on the medication tubes.   If your medication is too expensive, please contact our office at [phone] option 4 or send [place] a message through MyChart.   We are unable to tell what your co-pay for medications will be in advance as this is different depending on your insurance coverage. However, we may be able to find a substitute medication at lower cost or fill out paperwork to get insurance to cover a needed medication.   If a prior authorization is required to get your medication covered by your insurance company, please allow [place] 1-2 business days to complete this process.  Drug prices often vary depending on where the prescription is filled and some pharmacies may offer cheaper prices.  The website www.goodrx.com contains coupons for medications through different pharmacies. The prices here do not account for what the cost may be with help from insurance (it may be cheaper with your insurance), but the website can give you the price if you did not use any insurance.  - You can print the associated coupon and take it with your prescription to the pharmacy.  - You may also stop by our office during regular business hours and pick up a GoodRx coupon card.  - If you need your prescription sent electronically to a different pharmacy, notify our office through [facility] or by phone at [phone] option 4.     Si Usted Necesita Algo Despus de Su Visita  Tambin puede enviarnos un mensaje a travs de Clinical cytogeneticist. Por lo general respondemos a los mensajes de MyChart en el transcurso de 1 a 2 das hbiles.  Para  renovar recetas, por favor pida a su farmacia que se ponga en contacto con nuestra oficina. Annie Sable de fax es [place] [phone].  Si tiene un asunto urgente cuando la clnica est cerrada y que no puede esperar hasta el siguiente da hbil, puede llamar/localizar a su doctor(a) al nmero que aparece a continuacin.   Por favor, tenga en cuenta que aunque hacemos todo lo posible para estar disponibles para asuntos urgentes fuera del horario de [place], no estamos disponibles las 24 horas del da, los 7 [address]  [address] de la [place].   Si tiene un problema urgente y no puede comunicarse con nosotros, puede optar por buscar atencin mdica  en el consultorio de su doctor(a), en una clnica privada, en un centro de atencin urgente o en una sala de emergencias.  Si tiene Engineer, drilling, por favor llame inmediatamente al 911 o vaya a la sala de emergencias.  Nmeros de bper  - Dr. Gwen Pounds: [phone]  - Dra. Roseanne Reno: [phone]  - Dr. Katrinka Blazing: [phone]   En caso de inclemencias del tiempo, por favor llame a Lacy Duverney principal al [phone] para una actualizacin sobre el [place] de cualquier retraso o cierre.  Consejos para la medicacin en dermatologa: Por favor, guarde las cajas en las que vienen los medicamentos de uso tpico para ayudarle a seguir las instrucciones sobre dnde y cmo usarlos. Las farmacias generalmente imprimen las instrucciones del medicamento slo en las cajas y no directamente en los tubos del [place].   Si su medicamento es muy caro, por favor, pngase en contacto con Rolm Gala llamando al [phone] y presione la opcin 4 o envenos un mensaje a travs de Clinical cytogeneticist.   No podemos decirle cul ser su copago por los medicamentos por adelantado ya que esto es diferente dependiendo de la cobertura de su seguro. Sin embargo, es posible que podamos encontrar un medicamento sustituto a Audiological scientist un formulario para que el seguro cubra el  medicamento que se considera necesario.   Si se requiere una autorizacin previa para que su compaa de seguros [place] su medicamento, por favor permtanos de 1 a 2 das hbiles para completar [address]  Los precios de los medicamentos varan con frecuencia dependiendo del Environmental consultant de dnde se surte la receta y alguna farmacias pueden ofrecer precios ms baratos.  El sitio web www.goodrx.com tiene cupones para medicamentos de Health and safety inspector. Los precios aqu no tienen en cuenta lo que podra costar con la ayuda del seguro (puede ser ms barato con su seguro), pero el sitio web puede darle el precio si no utiliz Tourist information centre manager.  - Puede imprimir el cupn correspondiente y llevarlo con su receta a la farmacia.  - Tambin puede pasar por nuestra oficina durante el horario de atencin regular y Education officer, museum una tarjeta de cupones de GoodRx.  - Si necesita que su receta se enve electrnicamente a una farmacia diferente, informe a nuestra oficina a travs de MyChart de [place] o por telfono llamando al [phone] y presione la opcin 4.

## 2023-10-16 DIAGNOSIS — E782 Mixed hyperlipidemia: Secondary | ICD-10-CM | POA: Diagnosis not present

## 2023-10-16 DIAGNOSIS — I10 Essential (primary) hypertension: Secondary | ICD-10-CM | POA: Diagnosis not present

## 2023-10-16 DIAGNOSIS — I5020 Unspecified systolic (congestive) heart failure: Secondary | ICD-10-CM | POA: Diagnosis not present

## 2023-10-19 LAB — SURGICAL PATHOLOGY

## 2023-10-20 ENCOUNTER — Telehealth: Payer: Self-pay

## 2023-10-20 ENCOUNTER — Encounter: Payer: Self-pay | Admitting: Dermatology

## 2023-10-20 NOTE — Telephone Encounter (Signed)
-----   Message from Armida Sans sent at 10/20/2023  9:29 AM EST ----- FINAL DIAGNOSIS        1. Skin (M), left distal pretibial :       WELL DIFFERENTIATED SQUAMOUS CELL CARCINOMA        2. Skin (M), left proximal pretibial :       WELL DIFFERENTIATED SQUAMOUS CELL CARCINOMA        3. Skin (M), right middle medial lower leg :       WELL DIFFERENTIATED SQUAMOUS CELL CARCINOMA   1,2,3 - all three Cancer = SCC Well differentiated All 3 already treated  Recheck next visit

## 2023-10-20 NOTE — Telephone Encounter (Signed)
 Advised pt of bx results/sh ?

## 2023-10-28 ENCOUNTER — Other Ambulatory Visit: Payer: Self-pay

## 2023-10-28 DIAGNOSIS — D492 Neoplasm of unspecified behavior of bone, soft tissue, and skin: Secondary | ICD-10-CM

## 2023-10-28 MED ORDER — MUPIROCIN 2 % EX OINT: 1.0000 | TOPICAL_OINTMENT | Freq: Every day | CUTANEOUS | 11 refills | Status: DC

## 2023-10-28 NOTE — Progress Notes (Signed)
 Pharmacy is needing updated RX quantity and day supply for insurance purposes. aw

## 2023-11-03 ENCOUNTER — Inpatient Hospital Stay

## 2023-11-03 ENCOUNTER — Encounter: Payer: Self-pay | Admitting: Oncology

## 2023-11-03 ENCOUNTER — Inpatient Hospital Stay: Attending: Oncology | Admitting: Oncology

## 2023-11-03 VITALS — BP 145/78 | HR 88 | Temp 96.3°F | Resp 18 | Wt 109.7 lb

## 2023-11-03 DIAGNOSIS — C44722 Squamous cell carcinoma of skin of right lower limb, including hip: Secondary | ICD-10-CM | POA: Diagnosis not present

## 2023-11-03 DIAGNOSIS — C4492 Squamous cell carcinoma of skin, unspecified: Secondary | ICD-10-CM

## 2023-11-03 NOTE — Progress Notes (Signed)
 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 Patient is immunocompetent.  HIV was previously tested and was negative.  Clinically she does not have any inguinal lymph node enlargements. I discussed with patient and her husband that systemic chemoprevention does not have an established role in the prevention of cutaneous squamous cell carcinoma.   Immunotherapy cemiplimab or pembrolizumab may be options if she develops primary or recurrent locally advanced lesion that is refractory to surgery/radiation or in metastatic setting.  In neoadjuvant setting, cemiplimab may be offered in patient with stage II to stage IV disease.  In this phase 2 trial, skin lesions of stage II are at least 3 cm in greatest diameter.  If patient has high risk, very high risk squamous cell lesions resected with positive margin status and additional surgery is not feasible due to anatomic constraints, alternative treatment with radiation or immunotherapy can be considered. I will further discuss with dermatology regarding recent surgery pathology findings.   Currently I think her best option is to proceed with regular screening and surgical treatment for precancer and early stage cancer lesions. Oral retinoids, nicotinamide,  may be effective in reducing development of AK and SCC.-I will defer to dermatology.  No orders of the defined types were placed in this encounter.  All questions were answered. The patient knows to call the clinic with any problems, questions or concerns.  Rickard Patience, MD, PhD [facility] Hematology Oncology 11/03/2023      HISTORY OF PRESENTING ILLNESS:   Joann Warren is a  78 y.o.  female with PMH listed below was seen in consultation at the request of  [name], NP  for evaluation of multiple skin squamous cell carcinoma. 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  She has been through multiple surgery as well as local radiation.  She has actinic keratosis and form new SCC lesions.  Patient has had over 100 treatments of multiple SCC lesions. Patient also has a history of basal cell carcinoma and has received multiple surgical resections. Patient has had persistently occurrence of new lesions and has been on treatment every couple of weeks. Patient has also have done field treatment with photodynamic therapy with Levulan as well as 5-FU chemo chemowraps.   Patient was last seen by me in June 2021.  Systemic chemotherapy or immunotherapy was not offered due to lack of locally advanced disease or metastatic disease.  Due to the persistent occurrence of new lesions, patient was referred to re-establish care with me to see if there is any new treatment options. Patient was accompanied by her husband. 1 month ago, patient had 2 squamous lesions removed from left lower extremity, and 1 removed from right lower extremity.  She denies any groin lymph node enlargements.  Dr. Gwen Pounds feels that these lesions all stage I disease.   Review of Systems  Constitutional:  Negative for appetite change, chills, fatigue and fever.  HENT:   Negative for hearing loss and voice change.   Eyes:  Negative for eye problems.  Respiratory:  Negative for chest  tightness and cough.   Cardiovascular:  Negative for chest pain.  Gastrointestinal:  Negative for abdominal distention, abdominal pain and blood in stool.  Endocrine: Negative for hot flashes.  Genitourinary:  Negative for  difficulty urinating and frequency.   Musculoskeletal:  Negative for arthralgias.  Skin:        Multiple resection of skin cancer.  Neurological:  Negative for extremity weakness.  Hematological:  Negative for adenopathy.  Psychiatric/Behavioral:  Negative for confusion.     MEDICAL HISTORY:  Past Medical History:  Diagnosis Date   Actinic keratosis    Anemia    Basal cell carcinoma 12/23/2007   Upper back.   Basal cell carcinoma 05/17/2009   Right med lower leg   Basal cell carcinoma 07/06/2014   Left medial breast   Basal cell carcinoma 03/01/2015   Right medial mid pretibial   Basal cell carcinoma 07/16/2015   Right superior breast   Basal cell carcinoma 05/21/2016   Left inferior medial breast   Basal cell carcinoma 02/08/2018   Left medial breast   Basal cell carcinoma 11/01/2018   Left medial breast   Basal cell carcinoma 03/30/2019   Left med inf breast   Basal cell carcinoma 09/05/2019   Left proximal bicep   Basal cell carcinoma 12/08/2019   Right forehead. Nodular pattern.   Basal cell carcinoma 12/13/2019   Left med. breast inf. Nodular and infiltrative patterns. EDC   Basal cell carcinoma 12/13/2019   Left med. breast. sup. Nodular pattern. EDC   Basal cell carcinoma 05/14/2020   Right upper back. BCC with focal sclerosis. EDC.   Basal cell carcinoma 12/25/2020   Left inf med breast EDC   Basal cell carcinoma 07/03/2021   Right bicep EDC   Basal cell carcinoma 06/10/2022   left inferior medial breast   Cancer (HCC)    squamous cell   Glaucoma    Hyperlipidemia    Hypertension    Osteoporosis    SCC (squamous cell carcinoma) 12/16/2021   R forearm, EDC   SCC (squamous cell carcinoma) 12/16/2021   L pretibial, EDC   SCC (squamous cell carcinoma) 04/23/2022   Left mid distal pretibial lat - EDC   SCC (squamous cell carcinoma) 04/23/2022   Left mid distal pretibial med - EDC   SCC (squamous cell carcinoma) 08/28/2022   L distal lat pretibial    SCC (squamous cell carcinoma) 08/28/2022   L proximal pretibial   SCC (squamous cell carcinoma) 12/31/2022   R lower leg anterior, EDC   SCC (squamous cell carcinoma) 12/31/2022   L lower leg anterior, EDC   SCC (squamous cell carcinoma) 12/31/2022   L chest superior, EDC   SCC (squamous cell carcinoma) 12/31/2022   L chest inferior, EDC   SCC (squamous cell carcinoma) 07/02/2023   right middle pretibial sup medial tx EDC   SCC (squamous cell carcinoma) 07/02/2023   right middle pretibial middle tx with ED&C   SCC (squamous cell carcinoma) 07/02/2023   right middle pretibial inferior tx with ED&C   SCC (squamous cell carcinoma) 07/02/2023   right lower leg inferior medial knee tx with ED&C   Squamous cell carcinoma of skin 12/08/2007   Left lower leg. WD SCC   Squamous cell carcinoma of skin 02/01/2008   Left distal med. pretibial. WD SCC   Squamous cell carcinoma of skin 02/01/2008   Right mid pretibial. WD SCC   Squamous cell carcinoma of skin 02/01/2008   Right mid pretibal inferior. WD SCC  with superficial infiltration.   Squamous cell carcinoma of skin 04/05/2008   Left dorsum hand. WD SCC with superficial infiltration.   Squamous cell carcinoma of skin 06/15/2008   Right lower leg inferior. WD SCC   Squamous cell carcinoma of skin 06/15/2008   Right lower leg superior. SCCis hypertrophic   Squamous cell carcinoma of skin 07/05/2008   Right lat. lower leg, Right lower leg, Left lower leg   Squamous cell carcinoma of skin 08/04/2008   Right ant. lower leg, Left med. lower leg   Squamous cell carcinoma of skin 09/01/2008   Right post. lower leg   Squamous cell carcinoma of skin 10/04/2008   Left post leg   Squamous cell carcinoma of skin 12/01/2008   Left lower leg   Squamous cell carcinoma of skin 03/08/2009   Right ant. mid lower leg, Right lower leg   Squamous cell carcinoma of skin 05/17/2009   Left lower post. leg superior   Squamous cell carcinoma of skin  10/11/2009   Left lower leg. KA-pattern   Squamous cell carcinoma of skin 03/28/2010   Right lower leg   Squamous cell carcinoma of skin 03/25/2013   Right lower leg   Squamous cell carcinoma of skin 06/23/2013   Right post. lower leg. Right medial lower leg. Left medial lower leg   Squamous cell carcinoma of skin 09/09/2013   Left anterior upper arm. Right anterior lower leg. Right lat. lower leg. Right below knee lower leg   Squamous cell carcinoma of skin 12/08/2013   Right posterior lower leg sup. Right forearm   Squamous cell carcinoma of skin 03/03/2014   Right posterior leg. Right med. lower leg   Squamous cell carcinoma of skin 06/22/2014   Right to of shoulder anterior. Right top of shoulder posterior   Squamous cell carcinoma of skin 08/03/2014   Right anterior forearm   Squamous cell carcinoma of skin 09/14/2014   Right chest   Squamous cell carcinoma of skin 10/30/2014   Right calf   Squamous cell carcinoma of skin 12/14/2014   Right med. distal pretibial sup. Right med distal pretibial inf. Left proximal bicep sup. Left proximal bicep inf.   Squamous cell carcinoma of skin 03/01/2015   Left lat proximal pretibial near knee   Squamous cell carcinoma of skin 03/29/2015   Left mid to prox. pretibial. Left mid. lat. pretibial.    Squamous cell carcinoma of skin 04/19/2015   Right mid calf. Right distal lat. pretibial   Squamous cell carcinoma of skin 05/31/2015   Right proximal med. pretibial ant. Right proximal med. pretibial posterior   Squamous cell carcinoma of skin 07/16/2015   Right upper arm inf. deltoid   Squamous cell carcinoma of skin 08/06/2015   Left distal pretibial   Squamous cell carcinoma of skin 09/12/2015   Left postauricular area   Squamous cell carcinoma of skin 12/12/2015   Left proximal pretibial x4 sites   Squamous cell carcinoma of skin 12/31/2015   Right med. lower leg above ankle sup. Right med. lower leg above ankle inf. Left inf. lat. calf  med. Left inf. calf lat.   Squamous cell carcinoma of skin 01/31/2016   Right med. sup. ankle area. Right med mid proximal calf. Left dorsum foot.    Squamous cell carcinoma of skin 03/17/2016   Left post. distal calf lateral. Left post distal calf med. Left mid lat ant. thigh. Right mid lat. calf   Squamous cell carcinoma of skin 05/21/2016   Right  proximal medial pretibial. Right mid med. pretibial.   Squamous cell carcinoma of skin 07/03/2016   Right mid med. pretibial. Right proximal med. pretibial   Squamous cell carcinoma of skin 08/20/2016   Right medial calf. Left distal pretibial. Right upper back paraspinal   Squamous cell carcinoma of skin 10/30/2016   Right medial calf x2   Squamous cell carcinoma of skin 12/10/2016   left mid back 6.0cm lat to spine. Left sup. lat. calf. Left inf. post. calf   Squamous cell carcinoma of skin 12/25/2016   Right medial above ankle. Right mid pretibial. Right prox. pretibial. Right prox. pretibial med.    Squamous cell carcinoma of skin 01/19/2017   Left forearm. Left ant. neck   Squamous cell carcinoma of skin 03/23/2017   Left med. pretibial below knee   Squamous cell carcinoma of skin 04/13/2017   Right lat. sup. ankle. Right medial ankle   Squamous cell carcinoma of skin 04/27/2017   Left med. distal prox. calf. Left medial distal calf. Left lat. distal prox. calf. Left lat distal distal calf   Squamous cell carcinoma of skin 05/14/2017   Left bicep. Left dorsum mid forearm   Squamous cell carcinoma of skin 06/25/2017   Right med. distal calf x3   Squamous cell carcinoma of skin 09/02/2017   Left med. infraclavicular   Squamous cell carcinoma of skin 12/21/2017   Left prox. med. lower leg. Right forearm. Left forearm. Left bicep   Squamous cell carcinoma of skin 01/07/2018   Left dorsum foot prox. Left dorsum foot distal. Left mid to distal calf. Right chest   Squamous cell carcinoma of skin 03/08/2018   Left bicep. Right proximal  dorsum forearm. Right distal med. pretibial sup. Right distal med pretibial inf.    Squamous cell carcinoma of skin 04/05/2018   Right sup. med scapula   Squamous cell carcinoma of skin 06/03/2018   Left med. ankle sup. Left med. ankle inf. Left lateral ankle.   Squamous cell carcinoma of skin 09/01/2018   Right mid lat. calf. Right lat. knee. Prox post calf   Squamous cell carcinoma of skin 11/01/2018   Left lateral ankle   Squamous cell carcinoma of skin 12/08/2018   Right lateral ankle lat malleolus   Squamous cell carcinoma of skin 01/05/2019   Right lateral elbow. Left mid medial calf. Left prox. ant. thigh. Right distal lat tricep   Squamous cell carcinoma of skin 01/19/2019   Right popliteal   Squamous cell carcinoma of skin 02/03/2019   Left mid lat calf. Left mid calf. Left dorsum lat distal foot. Left dorsum distal foot   Squamous cell carcinoma of skin 03/30/2019   Right lower leg above med ankle ant. Right lower leg above ankle sup. Right lower leg above the med ankle inf.   Squamous cell carcinoma of skin 05/12/2019   Right chest medial. Left deltoid   Squamous cell carcinoma of skin 06/23/2019   Right ant thigh distal above knee. Left med distal thigh. Left ant thigh above knee   Squamous cell carcinoma of skin 07/20/2019   Right chest. Right dorsal hand.   Squamous cell carcinoma of skin 08/03/2019   Left distal lat pretibial. Left distal med calf. Right prox. lat calf   Squamous cell carcinoma of skin 09/05/2019   Right middle bicep. Right sup lat bicep. Right sup mid bicep. Right sup med bicep   Squamous cell carcinoma of skin 09/15/2019   Right proximal med pretibial. Left distal lat thigh  Squamous cell carcinoma of skin 04/02/2020   Right calf, bleow right medial knee sup, below right medial knee post, below right medial knee inf   Squamous cell carcinoma of skin 05/14/2020   Right ant. shoulder. Mod. Diff. SCC. EDC   Squamous cell carcinoma of skin 06/27/2020    R mid to distal pretibial - ED&C   Squamous cell carcinoma of skin 06/27/2020   Below the R knee - ED&C    Squamous cell carcinoma of skin 06/27/2020   R prox pretibial    Squamous cell carcinoma of skin 06/27/2020   L distal med calf   Squamous cell carcinoma of skin 08/06/2020   Left ant deltoid   Squamous cell carcinoma of skin 12/25/2020   Left lat ankle EDC   Squamous cell carcinoma of skin 03/27/2021   left knee, EDC   Squamous cell carcinoma of skin 03/27/2021   left lateral leg, EDC   Squamous cell carcinoma of skin 07/03/2021   Left lat neck - EDC   Squamous cell carcinoma of skin 07/03/2021   Right ant thigh - EDC   Squamous cell carcinoma of skin 07/03/2021   Right prox lat pretibial - EDC   Squamous cell carcinoma of skin 10/16/2021   right med mid calf, EDC   Squamous cell carcinoma of skin 10/16/2021   left medial prox pretibial, EDC   Squamous cell carcinoma of skin 10/16/2021   left distal pretibial, EDC   Squamous cell carcinoma of skin 03/26/2023   Left ant lat ankle EDC   Squamous cell carcinoma of skin 03/26/2023   Left medial lower leg above ankle EDC EDC   Squamous cell carcinoma of skin 03/26/2023   Right lat ankle sup EDC   Squamous cell carcinoma of skin 03/26/2023   Right lat ankle middle EDC   Squamous cell carcinoma of skin 03/26/2023   Right lat ankle inf EDC   Squamous cell carcinoma of skin 10/15/2023   left distal pretibial, EDC   Squamous cell carcinoma of skin 10/15/2023   left proximal pretibial, EDC   Squamous cell carcinoma of skin 10/15/2023   right middle medial lower leg, EDC    SURGICAL HISTORY: Past Surgical History:  Procedure Laterality Date   BREAST EXCISIONAL BIOPSY Left 1972   neg   BREAST EXCISIONAL BIOPSY Right 1980   neg   COLONOSCOPY     ESOPHAGOGASTRODUODENOSCOPY (EGD) WITH PROPOFOL N/A 04/09/2015   Procedure: ESOPHAGOGASTRODUODENOSCOPY (EGD) WITH PROPOFOL;  Surgeon: Scot Jun, MD;  Location: [facility]  END[facility];  Service: Endoscopy;  Laterality: N/A;   skin cancer removal      SOCIAL HISTORY: Social History   Socioeconomic History   Marital status: Married    Spouse name: Not on file   Number of children: Not on file   Years of education: Not on file   Highest education level: Not on file  Occupational History   Not on file  Tobacco Use   Smoking status: Never   Smokeless tobacco: Never  Vaping Use   Vaping status: Never Used  Substance and Sexual Activity   Alcohol use: No   Drug use: No   Sexual activity: Not on file  Other Topics Concern   Not on file  Social History Narrative   Not on file   Social Drivers of Health   Financial Resource Strain: Not on file  Food Insecurity: Not on file  Transportation Needs: Not on file  Physical Activity: Not on file  Stress: Not  on file  Social Connections: Not on file  Intimate Partner Violence: Not on file    FAMILY HISTORY: Family History  Problem Relation Age of Onset   Breast cancer Sister 54   Atrial fibrillation Mother     ALLERGIES:  is allergic to neomycin-bacitracin zn-polymyx.  MEDICATIONS:  Current Outpatient Medications  Medication Sig Dispense Refill   alendronate (FOSAMAX) 70 MG tablet TAKE 1 TABLET EVERY 7 DAYS WITH A FULL GLASS OF WATER ON AN EMPTY STOMACH DO NOT LIE DOWN FOR AT LEAST 30 MIN 12 tablet 4   amLODipine (NORVASC) 5 MG tablet TAKE 1 TABLET BY MOUTH ONCE DAILY 90 tablet 3   Apoaequorin (PREVAGEN) 10 MG CAPS Take 10 mg by mouth daily.     bimatoprost (LUMIGAN) 0.01 % SOLN Place 1 drop into the right eye at bedtime.     empagliflozin (JARDIANCE) 10 MG TABS tablet Take 10 mg by mouth daily.     hydrOXYzine (ATARAX) 10 MG tablet TAKE ONE TABLET ONCE OR TWICE A DAY IF NEEDED FOR ITCH 30 tablet 2   ibandronate (BONIVA) 150 MG tablet Take 150 mg by mouth every 30 (thirty) days. Take in the morning with a full glass of water, on an empty stomach, and do not take anything else by mouth or lie down  for the next 30 min.     losartan (COZAAR) 25 MG tablet Take 25 mg by mouth daily.     mupirocin ointment (BACTROBAN) 2 % Apply 1 Application topically daily. Qd to wounds for a 14 day supply 22 g 11   simvastatin (ZOCOR) 20 MG tablet Take 1 tablet (20 mg total) by mouth at bedtime. 90 tablet 3   No current facility-administered medications for this visit.     PHYSICAL EXAMINATION: ECOG PERFORMANCE STATUS: 0 - Asymptomatic Vitals:   11/03/23 0934  BP: (!) 145/78  Pulse: 88  Resp: 18  Temp: (!) 96.3 F (35.7 C)  SpO2: 96%   Filed Weights   11/03/23 0934  Weight: 109 lb 11.2 oz (49.8 kg)    Physical Exam Constitutional:      General: She is not in acute distress. HENT:     Head: Normocephalic and atraumatic.  Eyes:     General: No scleral icterus. Cardiovascular:     Rate and Rhythm: Normal rate and regular rhythm.     Heart sounds: Normal heart sounds.  Pulmonary:     Effort: Pulmonary effort is normal. No respiratory distress.     Breath sounds: No wheezing.  Abdominal:     General: Bowel sounds are normal. There is no distension.     Palpations: Abdomen is soft.  Musculoskeletal:        General: No deformity. Normal range of motion.     Cervical back: Normal range of motion and neck supple.  Skin:    General: Skin is warm and dry.     Findings: No erythema or rash.     Comments: Patient has chronic skin changes due to multiple previous surgery/radiation/5-FU treatment for SCC.  she has recent surgery from shin area of bilateral lower extremities.  Neurological:     Mental Status: She is alert and oriented to person, place, and time. Mental status is at baseline.  Psychiatric:        Mood and Affect: Mood normal.     LABORATORY DATA:  I have reviewed the data as listed Lab Results  Component Value Date   WBC 6.5 05/07/2023  HGB 12.2 05/07/2023   HCT 39.4 05/07/2023   MCV 92 05/07/2023   PLT 222 05/07/2023   Recent Labs    05/07/23 0947  NA 140  K  4.4  CL 100  CO2 27  GLUCOSE 115*  BUN 21  CREATININE 1.26*  CALCIUM 9.8  PROT 6.6  ALBUMIN 4.1  AST 18  ALT 8  ALKPHOS 63  BILITOT 0.5   Iron/TIBC/Ferritin/ %Sat    Component Value Date/Time   IRON 55 04/30/2022 1312   TIBC 287 04/30/2022 1312   FERRITIN 112 04/30/2022 1312   IRONPCTSAT 19 04/30/2022 1312      RADIOGRAPHIC STUDIES: I have personally reviewed the radiological images as listed and agreed with the findings in the report. No results found.

## 2023-11-03 NOTE — Assessment & Plan Note (Signed)
 Multiple primary cutaneous SCC's, likely secondary to sun exposure. Patient is immunocompetent.  HIV was previously tested and was negative.  Clinically she does not have any inguinal lymph node enlargements. I discussed with patient and her husband that systemic chemoprevention does not have an established role in the prevention of cutaneous squamous cell carcinoma.   Immunotherapy cemiplimab or pembrolizumab may be options if she develops primary or recurrent locally advanced lesion that is refractory to surgery/radiation or in metastatic setting.  In neoadjuvant setting, cemiplimab may be offered in patient with stage II to stage IV disease.  In this phase 2 trial, skin lesions of stage II are at least 3 cm in greatest diameter.  If patient has high risk, very high risk squamous cell lesions resected with positive margin status and additional surgery is not feasible due to anatomic constraints, alternative treatment with radiation or immunotherapy can be considered. I will further discuss with dermatology regarding recent surgery pathology findings.   Currently I think her best option is to proceed with regular screening and surgical treatment for precancer and early stage cancer lesions. Oral retinoids, nicotinamide,  may be effective in reducing development of AK and SCC.-I will defer to dermatology.

## 2023-11-04 ENCOUNTER — Telehealth: Payer: Self-pay | Admitting: Genetic Counselor

## 2023-11-04 ENCOUNTER — Inpatient Hospital Stay: Admitting: Hospice and Palliative Medicine

## 2023-11-04 DIAGNOSIS — C4492 Squamous cell carcinoma of skin, unspecified: Secondary | ICD-10-CM

## 2023-11-04 NOTE — Telephone Encounter (Signed)
 Patients history reviewed for [facility] tumor board. Qualifies for genetic counseling given the family history of a sister diagnosed with breast cancer at 1. Recommendation shared with Laurette Schimke, NP.

## 2023-11-04 NOTE — Progress Notes (Signed)
 Multidisciplinary Oncology Council Documentation  SUHAILAH KWAN was presented by our [facility] on 11/04/2023, which included representatives from:  Palliative Care Dietitian  Physical/Occupational Therapist Nurse Navigator Genetics Social work Survivorship RN Financial Navigator Research RN   Ulyssa currently presents with history of SCC  We reviewed previous medical and familial history, history of present illness, and recent lab results along with all available histopathologic and imaging studies. The MOC considered available treatment options and made the following recommendations/referrals:  Genetics - family history of cancer  The MOC is a meeting of clinicians from various specialty areas who evaluate and discuss patients for whom a multidisciplinary approach is being considered. Final determinations in the plan of care are those of the provider(s).   Today's extended care, comprehensive team conference, Lynnet was not present for the discussion and was not examined.

## 2023-12-28 ENCOUNTER — Other Ambulatory Visit: Payer: Self-pay | Admitting: Dermatology

## 2023-12-28 DIAGNOSIS — L299 Pruritus, unspecified: Secondary | ICD-10-CM

## 2023-12-30 ENCOUNTER — Telehealth: Payer: Self-pay

## 2023-12-30 ENCOUNTER — Encounter: Payer: Self-pay | Admitting: Dermatology

## 2023-12-30 ENCOUNTER — Ambulatory Visit: Admitting: Dermatology

## 2023-12-30 DIAGNOSIS — Z85828 Personal history of other malignant neoplasm of skin: Secondary | ICD-10-CM

## 2023-12-30 DIAGNOSIS — Z5111 Encounter for antineoplastic chemotherapy: Secondary | ICD-10-CM | POA: Diagnosis not present

## 2023-12-30 DIAGNOSIS — Z7189 Other specified counseling: Secondary | ICD-10-CM

## 2023-12-30 DIAGNOSIS — Z8589 Personal history of malignant neoplasm of other organs and systems: Secondary | ICD-10-CM

## 2023-12-30 DIAGNOSIS — C44729 Squamous cell carcinoma of skin of left lower limb, including hip: Secondary | ICD-10-CM

## 2023-12-30 DIAGNOSIS — L570 Actinic keratosis: Secondary | ICD-10-CM

## 2023-12-30 DIAGNOSIS — C44722 Squamous cell carcinoma of skin of right lower limb, including hip: Secondary | ICD-10-CM

## 2023-12-30 DIAGNOSIS — R234 Changes in skin texture: Secondary | ICD-10-CM | POA: Diagnosis not present

## 2023-12-30 DIAGNOSIS — D492 Neoplasm of unspecified behavior of bone, soft tissue, and skin: Secondary | ICD-10-CM | POA: Diagnosis not present

## 2023-12-30 DIAGNOSIS — W908XXA Exposure to other nonionizing radiation, initial encounter: Secondary | ICD-10-CM

## 2023-12-30 DIAGNOSIS — Z79899 Other long term (current) drug therapy: Secondary | ICD-10-CM

## 2023-12-30 DIAGNOSIS — L578 Other skin changes due to chronic exposure to nonionizing radiation: Secondary | ICD-10-CM

## 2023-12-30 MED ORDER — FLUOROURACIL 5 % EX CREA: TOPICAL_CREAM | CUTANEOUS | 2 refills | Status: DC

## 2023-12-30 NOTE — Progress Notes (Signed)
 Follow-Up Visit   Subjective  Joann Warren is a 78 y.o. female who presents for the following: SCCs. Here for biopsies and treatment of additional SCCs on legs. Raised, painful.   Patient states that one site treated on left leg is still scabbing over and has not healed. Left distal pretibial.   The patient has spots, moles and lesions to be evaluated, some may be new or changing and the patient may have concern these could be cancer.  The following portions of the chart were reviewed this encounter and updated as appropriate: medications, allergies, medical history  Review of Systems:  No other skin or systemic complaints except as noted in HPI or Assessment and Plan.  Objective  Well appearing patient in no apparent distress; mood and affect are within normal limits.  A focused examination was performed of the following areas: Legs  Relevant physical exam findings are noted in the Assessment and Plan.  Right Lateral Lower Leg 2.1 cm hyperkeratotic nodule  Left Lateral Lower Leg Proximal 2.2 cm hyperkeratotic nodule  Left Lateral Lower Leg Distal near ankle 2.1 cm hyperkeratotic nodule   Assessment & Plan   Reviewed note from Dr. Timmy Forbes, 11/03/2023.   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 Patient is immunocompetent.  HIV was previously tested and was negative.  Clinically she does not have any inguinal lymph node enlargements. I discussed with patient and her husband that systemic chemoprevention does not have an established role in the prevention of cutaneous squamous cell carcinoma.    Immunotherapy cemiplimab or pembrolizumab may be options if she develops primary or recurrent locally advanced lesion that is refractory to surgery/radiation or in metastatic setting.  In neoadjuvant setting, cemiplimab may be offered in patient with stage II to  stage IV disease.  In this phase 2 trial, skin lesions of stage II are at least 3 cm in greatest diameter.  If patient has high risk, very high risk squamous cell lesions resected with positive margin status and additional surgery is not feasible due to anatomic constraints, alternative treatment with radiation or immunotherapy can be considered. I will further discuss with dermatology regarding recent surgery pathology findings.     Currently I think her best option is to proceed with regular screening and surgical treatment for precancer and early stage cancer lesions. Oral retinoids, nicotinamide,  may be effective in reducing development of AK and SCC.-I will defer to dermatology.      HISTORY OF SQUAMOUS CELL CARCINOMA OF THE SKIN. Multiple sites, see history. - No evidence of recurrence today - No lymphadenopathy - Recommend regular full body skin exams - Recommend daily broad spectrum sunscreen SPF 30+ to sun-exposed areas, reapply every 2 hours as needed.  - Call if any new or changing lesions are noted between office visits  Eschar Exam: healing EDC site at anterior left lower distal leg with central scabbing.  Treatment: Wash daily with soap and water, apply mupirocin , keep covered. Consider debridement if persists.  HISTORY OF BASAL CELL CARCINOMA OF THE SKIN - No evidence of recurrence today - Recommend regular full body skin exams - Recommend daily broad spectrum sunscreen SPF 30+ to sun-exposed areas, reapply every 2 hours as needed.  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 - diffuse scaly erythematous macules and  papules with underlying dyspigmentation - Discussed Prescription "Field Treatment" topical Chemotherapy for Severe, Chronic Confluent Actinic Changes with Pre-Cancerous Actinic Keratoses Field treatment involves treatment of an entire area of skin that has confluent Actinic Changes (Sun/ Ultraviolet light damage) and PreCancerous Actinic Keratoses by method of PhotoDynamic Therapy (PDT) and/or prescription Topical Chemotherapy agents such as 5-fluorouracil , 5-fluorouracil /calcipotriene, and/or imiquimod.  The purpose is to decrease the number of clinically evident and subclinical PreCancerous lesions to prevent progression to development of skin cancer by chemically destroying early precancer changes that may or may not be visible.  It has been shown to reduce the risk of developing skin cancer in the treated area. As a result of treatment, redness, scaling, crusting, and open sores may occur during treatment course. One or more than one of these methods may be used and may have to be used several times to control, suppress and eliminate the PreCancerous changes. Discussed treatment course, expected reaction, and possible side effects. - Recommend daily broad spectrum sunscreen SPF 30+ to sun-exposed areas, reapply every 2 hours as needed.  - Staying in the shade or wearing long sleeves, sun glasses (UVA+UVB protection) and wide brim hats (4-inch brim around the entire circumference of the hat) are also recommended. - Call for new or changing lesions.  - Start 5-fluorouracil /calcipotriene cream twice a day for up to 2 weeks to affected areas including top of left foot. Prescription sent to [facility]. Patient advised they will receive an email to purchase the medication online and have it sent to their home. Patient provided with handout reviewing treatment course and side effects and advised to call or message us  on MyChart with any concerns.  Reviewed course of treatment and  expected reaction.  Patient advised to expect inflammation and crusting and advised that erosions are possible.  Patient advised to be diligent with sun protection during and after treatment. Counseled to keep medication out of reach of children and pets.  NEOPLASM OF SKIN (3) Right Lateral Lower Leg Epidermal / dermal shaving  Lesion diameter (cm):  2.1 Informed consent: discussed and consent obtained   Timeout: patient name, date of birth, surgical site, and procedure verified   Procedure prep:  Patient was prepped and draped in usual sterile fashion Prep type:  Isopropyl alcohol Anesthesia: the lesion was anesthetized in a standard fashion   Anesthetic:  1% lidocaine w/ epinephrine 1-100,000 buffered w/ 8.4% NaHCO3 Instrument used: flexible razor blade   Hemostasis achieved with: pressure, aluminum chloride and electrodesiccation   Outcome: patient tolerated procedure well   Post-procedure details: sterile dressing applied and wound care instructions given   Dressing type: bandage and petrolatum    Destruction of lesion Complexity: extensive   Destruction method: electrodesiccation and curettage   Informed consent: discussed and consent obtained   Timeout:  patient name, date of birth, surgical site, and procedure verified Procedure prep:  Patient was prepped and draped in usual sterile fashion Prep type:  Isopropyl alcohol Anesthesia: the lesion was anesthetized in a standard fashion   Anesthetic:  1% lidocaine w/ epinephrine 1-100,000 buffered w/ 8.4% NaHCO3 Curettage performed in three different directions: Yes   Electrodesiccation performed over the curetted area: Yes   Curettage cycles:  3 Lesion length (cm):  2.1 Lesion width (cm):  2.1 Margin per side (cm):  0.2 Final wound size (cm):  2.5 Hemostasis achieved with:  pressure and aluminum chloride Outcome: patient tolerated procedure well with no complications   Post-procedure details: sterile dressing applied and wound  care instructions given   Dressing type: bandage, petrolatum and pressure dressing   Specimen 1 - Surgical pathology Differential Diagnosis: R/O SCC  Check Margins: No  EDC today Left Lateral Lower Leg Proximal Epidermal / dermal shaving  Lesion diameter (cm):  2.2 Informed consent: discussed and consent obtained   Timeout: patient name, date of birth, surgical site, and procedure verified   Procedure prep:  Patient was prepped and draped in usual sterile fashion Prep type:  Isopropyl alcohol Anesthesia: the lesion was anesthetized in a standard fashion   Anesthetic:  1% lidocaine w/ epinephrine 1-100,000 buffered w/ 8.4% NaHCO3 Instrument used: flexible razor blade   Hemostasis achieved with: pressure, aluminum chloride and electrodesiccation   Outcome: patient tolerated procedure well   Post-procedure details: sterile dressing applied and wound care instructions given   Dressing type: bandage and petrolatum    Destruction of lesion Complexity: extensive   Destruction method: electrodesiccation and curettage   Informed consent: discussed and consent obtained   Timeout:  patient name, date of birth, surgical site, and procedure verified Procedure prep:  Patient was prepped and draped in usual sterile fashion Prep type:  Isopropyl alcohol Anesthesia: the lesion was anesthetized in a standard fashion   Anesthetic:  1% lidocaine w/ epinephrine 1-100,000 buffered w/ 8.4% NaHCO3 Curettage performed in three different directions: Yes   Electrodesiccation performed over the curetted area: Yes   Curettage cycles:  3 Lesion length (cm):  2.2 Lesion width (cm):  2.2 Margin per side (cm):  0.2 Final wound size (cm):  2.6 Hemostasis achieved with:  pressure and aluminum chloride Outcome: patient tolerated procedure well with no complications   Post-procedure details: sterile dressing applied and wound care instructions given   Dressing type: bandage, pressure dressing and petrolatum    Specimen 2 - Surgical pathology Differential Diagnosis: R/O SCC  Check Margins: No  EDC today Left Lateral Lower Leg Distal near ankle Epidermal / dermal shaving  Lesion diameter (cm):  2.1 Informed consent: discussed and consent obtained   Timeout: patient name, date of birth, surgical site, and procedure verified   Procedure prep:  Patient was prepped and draped in usual sterile fashion Prep type:  Isopropyl alcohol Anesthesia: the lesion was anesthetized in a standard fashion   Anesthetic:  1% lidocaine w/ epinephrine 1-100,000 buffered w/ 8.4% NaHCO3 Instrument used: flexible razor blade   Hemostasis achieved with: pressure, aluminum chloride and electrodesiccation   Outcome: patient tolerated procedure well   Post-procedure details: sterile dressing applied and wound care instructions given   Dressing type: bandage and petrolatum    Destruction of lesion Complexity: extensive   Destruction method: electrodesiccation and curettage   Informed consent: discussed and consent obtained   Timeout:  patient name, date of birth, surgical site, and procedure verified Procedure prep:  Patient was prepped and draped in usual sterile fashion Prep type:  Isopropyl alcohol Anesthesia: the lesion was anesthetized in a standard fashion   Anesthetic:  1% lidocaine w/ epinephrine 1-100,000 buffered w/ 8.4% NaHCO3 Curettage performed in three different directions: Yes   Electrodesiccation performed over the curetted area: Yes   Curettage cycles:  3 Lesion length (cm):  2.1 Lesion width (cm):  2.1 Margin per side (cm):  0.2 Final wound size (cm):  2.5 Hemostasis  achieved with:  pressure and aluminum chloride Outcome: patient tolerated procedure well with no complications   Post-procedure details: sterile dressing applied and wound care instructions given   Dressing type: bandage, petrolatum and pressure dressing   Specimen 3 - Surgical pathology Differential Diagnosis: R/O SCC  Check  Margins: No  EDC today Related Medications mupirocin  ointment (BACTROBAN ) 2 % Apply 1 Application topically daily. Qd to wounds for a 14 day supply ACTINIC SKIN DAMAGE   HISTORY OF SQUAMOUS CELL CARCINOMA   HISTORY OF BASAL CELL CARCINOMA   CHEMOTHERAPY MANAGEMENT, ENCOUNTER FOR   COUNSELING AND COORDINATION OF CARE   MEDICATION MANAGEMENT     Return for SCC Follow Up, Biopsies.  I, Jill Parcell, CMA, am acting as scribe for Celine Collard, MD.   Documentation: I have reviewed the above documentation for accuracy and completeness, and I agree with the above.  Celine Collard, MD

## 2023-12-30 NOTE — Telephone Encounter (Signed)
 Patient spouse advised of information per Dr. Bary Likes. aw

## 2023-12-30 NOTE — Telephone Encounter (Signed)
 Patient's spouse has called stating patient is in significant pain from this mornings treatment. She has taken some tylenol  and laid in the bed with her legs elevated but she has never had this pain from removals before. Please advise.

## 2023-12-30 NOTE — Patient Instructions (Signed)
 - Start 5-fluorouracil/calcipotriene cream twice a day for up to 2 weeks to affected areas including top of left foot. Prescription sent to [facility]. Patient advised they will receive an email to purchase the medication online and have it sent to their home. Patient provided with handout reviewing treatment course and side effects and advised to call or message us  on MyChart with any concerns.  Reviewed course of treatment and expected reaction.  Patient advised to expect inflammation and crusting and advised that erosions are possible.  Patient advised to be diligent with sun protection during and after treatment. Counseled to keep medication out of reach of children and pets.    Instructions for [facility] Medications  One or more of your medications was sent to the [facility] mail order compound[facility] pharmacy. You will receive an email from them and can purchase the medicine through that link. It will then be mailed to your home at the address you confirmed. If for any reason you do not receive an email from them, please check your spam folder. If you still do not find the email, please let us  know. [facility] phone number is [phone].    Wound Care Instructions  Cleanse wound gently with soap and water once a day then pat dry with clean gauze. Apply a thin coat of Petrolatum (petroleum jelly, "Vaseline") over the wound (unless you have an allergy to this). We recommend that you use a new, sterile tube of Vaseline. Do not pick or remove scabs. Do not remove the yellow or white "healing tissue" from the base of the wound.  Cover the wound with fresh, clean, nonstick gauze and secure with paper tape. You may use Band-Aids in place of gauze and tape if the wound is small enough, but would recommend trimming much of the tape off as there is often too much. Sometimes Band-Aids can irritate the skin.  You should call the office for your biopsy report after 1  week if you have not already been contacted.  If you experience any problems, such as abnormal amounts of bleeding, swelling, significant bruising, significant pain, or evidence of infection, please call the office immediately.  FOR ADULT SURGERY PATIENTS: If you need something for pain relief you may take 1 extra strength Tylenol  (acetaminophen ) AND 2 Ibuprofen  (200mg  each) together every 4 hours as needed for pain. (do not take these if you are allergic to them or if you have a reason you should not take them.) Typically, you may only need pain medication for 1 to 3 days.      Recommend daily broad spectrum sunscreen SPF 30+ to sun-exposed areas, reapply every 2 hours as needed. Call for new or changing lesions.  Staying in the shade or wearing long sleeves, sun glasses (UVA+UVB protection) and wide brim hats (4-inch brim around the entire circumference of the hat) are also recommended for sun protection.     Due to recent changes in healthcare laws, you may see results of your pathology and/or laboratory studies on MyChart before the doctors have had a chance to review them. We understand that in some cases there may be results that are confusing or concerning to you. Please understand that not all results are received at the same time and often the doctors may need to interpret multiple results in order to provide you with the best plan of care or course of treatment. Therefore, we ask that you please give us  2 business days to thoroughly review  all your results before contacting the office for clarification. Should we see a critical lab result, you will be contacted sooner.   If You Need Anything After Your Visit  If you have any questions or concerns for your doctor, please call our main line at [phone] and press option 4 to reach your doctor's medical assistant. If no one answers, please leave a voicemail as directed and we will return your call as soon as possible. Messages left after  4 pm will be answered the following business day.   You may also send us  a message via MyChart. We typically respond to MyChart messages within 1-2 business days.  For prescription refills, please ask your pharmacy to contact our office. Our fax number is [phone].  If you have an urgent issue when the clinic is closed that cannot wait until the next business day, you can page your doctor at the number below.    Please note that while we do our best to be available for urgent issues outside of office hours, we are not available 24/7.   If you have an urgent issue and are unable to reach us , you may choose to seek medical care at your doctor's office, retail clinic, urgent care center, or emergency room.  If you have a medical emergency, please immediately call 911 or go to the emergency department.  Pager Numbers  - Dr. Bary Likes: [phone]  - Dr. Annette Barters: [phone]  - Dr. Felipe Horton: [phone]   In the event of inclement weather, please call our main line at [phone] for an update on the status of any delays or closures.  Dermatology Medication Tips: Please keep the boxes that topical medications come in in order to help keep track of the instructions about where and how to use these. Pharmacies typically print the medication instructions only on the boxes and not directly on the medication tubes.   If your medication is too expensive, please contact our office at [phone] option 4 or send us  a message through MyChart.   We are unable to tell what your co-pay for medications will be in advance as this is different depending on your insurance coverage. However, we may be able to find a substitute medication at lower cost or fill out paperwork to get insurance to cover a needed medication.   If a prior authorization is required to get your medication covered by your insurance company, please allow us  1-2 business days to complete this process.  Drug prices often vary  depending on where the prescription is filled and some pharmacies may offer cheaper prices.  The website www.goodrx.com contains coupons for medications through different pharmacies. The prices here do not account for what the cost may be with help from insurance (it may be cheaper with your insurance), but the website can give you the price if you did not use any insurance.  - You can print the associated coupon and take it with your prescription to the pharmacy.  - You may also stop by our office during regular business hours and pick up a GoodRx coupon card.  - If you need your prescription sent electronically to a different pharmacy, notify our office through [facility] or by phone at [phone] option 4.     Si Usted Necesita Algo Despus de Su Visita  Tambin puede enviarnos un mensaje a travs de Clinical cytogeneticist. Por lo general respondemos a los mensajes de MyChart en el transcurso de 1 a 2 das hbiles.  Para renovar  recetas, por favor pida a su farmacia que se ponga en contacto con nuestra oficina. Franz Jacks de fax es [place] [phone].  Si tiene un asunto urgente cuando la clnica est cerrada y que no puede esperar hasta el siguiente da hbil, puede llamar/localizar a su doctor(a) al nmero que aparece a continuacin.   Por favor, tenga en cuenta que aunque hacemos todo lo posible para estar disponibles para asuntos urgentes fuera del horario de [place], no estamos disponibles las 24 horas del da, los 7 [address]  [address] de la [place].   Si tiene un problema urgente y no puede comunicarse con nosotros, puede optar por buscar atencin mdica  en el consultorio de su doctor(a), en una clnica privada, en un centro de atencin urgente o en una sala de emergencias.  Si tiene Engineer, drilling, por favor llame inmediatamente al 911 o vaya a la sala de emergencias.  Nmeros de bper  - Dr. Bary Likes: [phone]  - Dra. Annette Barters: [phone]  - Dr. Felipe Horton: [phone]   En caso de  inclemencias del tiempo, por favor llame a Lajuan Pila principal al [phone] para una actualizacin sobre el [place] de cualquier retraso o cierre.  Consejos para la medicacin en dermatologa: Por favor, guarde las cajas en las que vienen los medicamentos de uso tpico para ayudarle a seguir las instrucciones sobre dnde y cmo usarlos. Las farmacias generalmente imprimen las instrucciones del medicamento slo en las cajas y no directamente en los tubos del [place].   Si su medicamento es muy caro, por favor, pngase en contacto con Bettyjane Brunet llamando al [phone] y presione la opcin 4 o envenos un mensaje a travs de Clinical cytogeneticist.   No podemos decirle cul ser su copago por los medicamentos por adelantado ya que esto es diferente dependiendo de la cobertura de su seguro. Sin embargo, es posible que podamos encontrar un medicamento sustituto a Audiological scientist un formulario para que el seguro cubra el medicamento que se considera necesario.   Si se requiere una autorizacin previa para que su compaa de seguros [place] su medicamento, por favor permtanos de 1 a 2 das hbiles para completar este proceso.  Los precios de los medicamentos varan con frecuencia dependiendo del Environmental consultant de dnde se surte la receta y alguna farmacias pueden ofrecer precios ms baratos.  El sitio web www.goodrx.com tiene cupones para medicamentos de Health and safety inspector. Los precios aqu no tienen en cuenta lo que podra costar con la ayuda del seguro (puede ser ms barato con su seguro), pero el sitio web puede darle el precio si no utiliz Tourist information centre manager.  - Puede imprimir el cupn correspondiente y llevarlo con su receta a la farmacia.  - Tambin puede pasar por nuestra oficina durante el horario de atencin regular y Education officer, museum una tarjeta de cupones de GoodRx.  - Si necesita que su receta se enve electrnicamente a una farmacia diferente, informe a nuestra oficina a travs de MyChart de  o  por telfono llamando al [phone] y presione la opcin 4.

## 2023-12-31 ENCOUNTER — Encounter: Payer: Self-pay | Admitting: Dermatology

## 2024-01-01 LAB — SURGICAL PATHOLOGY

## 2024-01-04 ENCOUNTER — Ambulatory Visit: Payer: Self-pay | Admitting: Dermatology

## 2024-01-05 ENCOUNTER — Encounter: Payer: Self-pay | Admitting: Dermatology

## 2024-01-05 DIAGNOSIS — I5020 Unspecified systolic (congestive) heart failure: Secondary | ICD-10-CM | POA: Diagnosis not present

## 2024-01-05 NOTE — Telephone Encounter (Addendum)
 Called and discussed results with patient. She verbalized understanding and denied further questions. Will recheck at next follow up  ----- Message from Celine Collard sent at 01/04/2024  5:46 PM EDT ----- FINAL DIAGNOSIS        1. Skin, right lateral lower leg :       WELL DIFFERENTIATED SQUAMOUS CELL CARCINOMA        2. Skin, left lateral lower leg proximal :       WELL DIFFERENTIATED SQUAMOUS CELL CARCINOMA        3. Skin, left lateral lower leg distal near ankle :       WELL DIFFERENTIATED SQUAMOUS CELL CARCINOMA   1,2,3 - all three Cancer = SCC Well differentiated All three already treated Recheck next visit

## 2024-01-14 DIAGNOSIS — I5020 Unspecified systolic (congestive) heart failure: Secondary | ICD-10-CM | POA: Diagnosis not present

## 2024-01-14 DIAGNOSIS — E782 Mixed hyperlipidemia: Secondary | ICD-10-CM | POA: Diagnosis not present

## 2024-01-14 DIAGNOSIS — I10 Essential (primary) hypertension: Secondary | ICD-10-CM | POA: Diagnosis not present

## 2024-01-14 NOTE — Progress Notes (Signed)
 New Patient Visit   Chief Complaint: Chief Complaint  Patient presents with  . HFrEF (heart failure with reduced ejection fraction) (CMS/H  . Follow-up   Date of Service: 01/14/2024 Date of Birth: 1945-09-18 PCP: Fernand Sigrid HERO, MD [address] [place] [address]  History of Present Illness:   Ms. Joann Warren is a 78 y.o.female patient with past medical history significant for hypertension and hyperlipidemia who is here for a follow up visit.  History of Present Illness Joann Warren is a 78 year old female with a history of heart issues who presents for a cardiovascular follow-up.  Her heart condition remains stable with no significant changes noted in her recent echocardiogram. She attributes the weakness of her heart to past radiation treatment on her legs and the use of Efudex  cream for skin cancers. She recalls undergoing radiation for six weeks, receiving over a hundred treatments, and believes this may have contributed to her heart condition.  She mentions a past positive COVID-19 test, although she was asymptomatic and unaware of having the virus at the time. She expresses relief that her heart condition has not worsened, as she was concerned about potential negative impacts on her summer plans.  She has been losing weight, which she attributes to increased urination. She is currently taking Jardiance and has noticed weight loss. Her weight has decreased from 109 to 104 pounds. No new symptoms or changes in her heart condition. Reports increased urination and weight loss.    Past Medical and Surgical History  Past Medical History Past Medical History:  Diagnosis Date  . Anemia   . Hyperlipidemia   . Hypertension   . Low vitamin B12 level   . Low vitamin B12 level 03/10/2015  . Osteoporosis   . Squamous cell carcinoma of ear     Past Surgical History She has a past surgical history that includes Breast excisional biopsy (Bilateral); Colonoscopy (12/03/2007); and egd  (04/09/2015).   Medications and Allergies  Current Medications  Current Outpatient Medications on File Prior to Visit  Medication Sig Dispense Refill  . apoaequorin (PREVAGEN) capsule Take 10 mg by mouth once daily    . bimatoprost (LUMIGAN) 0.01 % ophthalmic solution 1 drop nightly.    . cyanocobalamin  (VITAMIN B12) 1000 MCG tablet Take 1,000 mcg by mouth once daily.    . fluorouraciL  (EFUDEX ) 5 % cream Apply twice daily to affected areas on left foot up to 2 weeks    . ibandronate (BONIVA) 150 mg tablet Take 150 mg by mouth every 30 (thirty) days. Take with a full glass of water. Do not lie down for the next 60 min.    . losartan  (COZAAR ) 25 MG tablet Take 1 tablet (25 mg total) by mouth at bedtime 30 tablet 11  . metoprolol  SUCCinate (TOPROL -XL) 25 MG XL tablet Take 1 tablet (25 mg total) by mouth once daily 30 tablet 11  . simvastatin  (ZOCOR ) 20 MG tablet Take 20 mg by mouth nightly.    . bimatoprost (LATISSE) 0.03 % ophthalmic solution Place 1 drop into the right eye nightly. Place one drop on applicator and apply evenly at the base of eyelashes nightly. Repeat procedure for the other eye. (Patient not taking: Reported on 10/16/2023)     No current facility-administered medications on file prior to visit.    Allergies: Neomycin-bacitracin-polymyxin  Social and Family History  Social History  reports that she has never smoked. She does not have any smokeless tobacco history on file.  Family History No  family history on file.  Review of Systems   Review of Systems  Respiratory:  Negative for shortness of breath.   Cardiovascular:  Negative for chest pain, palpitations, orthopnea, claudication, leg swelling and PND.      Physical Examination   Vitals:BP 138/82 (BP Location: Left upper arm, Patient Position: Sitting, BP Cuff Size: Adult)   Pulse 80   Ht 157.5 cm (5' 2)   Wt 47.2 kg (104 lb)   SpO2 99%   BMI 19.02 kg/m  Ht:157.5 cm (5' 2) Wt:47.2 kg (104 lb) ADJ:Anib  surface area is 1.44 meters squared. Body mass index is 19.02 kg/m.  Physical Exam Vitals reviewed.  HENT:     Head: Normocephalic and atraumatic.  Cardiovascular:     Rate and Rhythm: Normal rate and regular rhythm.     Pulses: Normal pulses.     Heart sounds: Normal heart sounds. No murmur heard. Pulmonary:     Effort: Pulmonary effort is normal.     Breath sounds: Normal breath sounds.  Abdominal:     General: Bowel sounds are normal.  Musculoskeletal:     Right lower leg: No edema.     Left lower leg: No edema.  Skin:    General: Skin is warm and dry.  Neurological:     Mental Status: She is alert.       Data & Results   No results for input(s): CHOLTOTAL, HDL, LDLCALC, LDLDIRECT, LDL, VLDL, TRIG in the last 73719 hours.  No results for input(s): NA, K, BUN, CREATININE, CO2, GLUCOSE, GLUF, ALT, AST, TBILI, ALB in the last 73719 hours.  No results for input(s): WBC, HGB, HCT, MCV, PLT in the last 73719 hours.  No results for input(s): INR, TSH, HGBA1C, HBA1C, PROBNP, BNP, HSTNI, DELTAHSTNI, HSTNIINTERP, TROPONINT, CK, CKMB, DDIMER in the last 73719 hours.      Assessment   78 y.o. female with  Encounter Diagnoses  Name Primary?  . HFrEF (heart failure with reduced ejection fraction) (CMS/HHS-HCC) Yes  . Mixed hyperlipidemia   . HTN, goal below 130/80       08/25/2023 ECHO: MODERATE LEFT VENTRICULAR SYSTOLIC DYSFUNCTION WITH NO LVH ESTIMATED EF: 30%, CALC EF(2D): 35% NORMAL LA PRESSURES WITH NORMAL DIASTOLIC FUNCTION NORMAL RIGHT VENTRICULAR SYSTOLIC FUNCTION VALVULAR REGURGITATION: MILD AR, MODERATE MR, MILD PR, SEVERE TR NO VALVULAR STENOSIS PHYSICIAN IMPRESSIONS -------------------------------------------------------------------- LA AREA 20cm^2 MILD PHTN GLS -12%   10/01/2023 NUC: Regional wall motion:  demonstrates  hypokinesis of the global walls borderline hypo . The overall  quality of the study is good.   Artifacts noted: no Left ventricular cavity: normal.   Perfusion Analysis:  SPECT images demonstrate small perfusion abnormality of mild intensity is present in the anterior region on the stress images. Defect type : Mixed     IMPRESSION: Borderline small area of anterior defect with borderline redistribution this is a relatively small area.  There is no clear evidence of ischemia but there is a persistent borderline defect ejection fraction is also borderline mildly low at 48%.  This is a moderate risk scan with mild reduced left ventricular function and borderline small anterior defect consider further evaluation or alternate imaging if clinically indicated   01/05/2024 ECHO: SEVERE LEFT VENTRICULAR SYSTOLIC DYSFUNCTION WITH MILD LVH ESTIMATED EF: 25%, CALC EF(2D): 28% ELEVATED LA PRESSURES WITH DIASTOLIC DYSFUNCTION (GRADE 2) NORMAL RIGHT VENTRICULAR SYSTOLIC FUNCTION VALVULAR REGURGITATION: MILD AR, MODERATE MR, MILD PR, MODERATE TR ESTIMATED RVSP: 58 mmHg NO VALVULAR STENOSIS   Plan  No orders of the defined types were placed in this encounter.  Continue current cardiac medications. Encourage low-sodium diet, less than 2000 mg daily. Echocardiogram shows newly reduced LVEF. Nuclear stress test as above - shared decision making, we will proceed with medical management rather than invasive cath. She has no heart failure or anginal symptoms whatsoever and she is extremely active. We will repeat echo prior to our next visit and continue to manage medically.  Continue Toprol , Jardiance, Losartan   Return in about 6 months (around 07/16/2024). Assessment & Plan Heart failure with reduced ejection fraction (HFrEF) HFrEF is well-managed with no changes on recent echocardiogram. The condition is attributed to prior radiation therapy and chemotherapy, known to affect cardiac function. No acute exacerbations or worsening symptoms reported. She expressed  concern about radiation and chemotherapy as contributing factors, which aligns with known risks of these treatments. - Continue current management for HFrEF. - Discontinue Empagliflozin (Jardiance) due to weight loss and increased urination.   Attestation Statement:   I personally performed the service, non-incident to. (WP)   SABINA CUSTOVIC, DO    Sabina Custovic, DO

## 2024-01-19 ENCOUNTER — Ambulatory Visit: Payer: PPO | Admitting: Dermatology

## 2024-02-02 ENCOUNTER — Encounter: Payer: Self-pay | Admitting: Nurse Practitioner

## 2024-02-02 ENCOUNTER — Ambulatory Visit (INDEPENDENT_AMBULATORY_CARE_PROVIDER_SITE_OTHER): Payer: PPO | Admitting: Nurse Practitioner

## 2024-02-02 VITALS — BP 138/80 | HR 99 | Temp 98.1°F | Resp 16 | Ht 62.0 in | Wt 106.2 lb

## 2024-02-02 DIAGNOSIS — I5020 Unspecified systolic (congestive) heart failure: Secondary | ICD-10-CM | POA: Diagnosis not present

## 2024-02-02 DIAGNOSIS — E782 Mixed hyperlipidemia: Secondary | ICD-10-CM | POA: Diagnosis not present

## 2024-02-02 DIAGNOSIS — N1832 Chronic kidney disease, stage 3b: Secondary | ICD-10-CM | POA: Diagnosis not present

## 2024-02-02 DIAGNOSIS — I10 Essential (primary) hypertension: Secondary | ICD-10-CM | POA: Diagnosis not present

## 2024-02-02 NOTE — Progress Notes (Signed)
 [facility] [address] [place], [place] [address]  Internal MEDICINE  Office Visit Note  Patient Name: Joann Warren  [id number]  [id number]  Date of Service: 02/02/2024  Chief Complaint  Patient presents with   Hyperlipidemia   Hypertension   Follow-up    HPI Keonia presents for a follow-up visit for heart failure, CKD and hypertension.  Heart failure -- taken off jardiance due to weight loss. Followed by cardiology Decreased kidney function -- has lab ordered from February but patient was not aware that she needed any labs done. Lab requisition given to patient to have done by end of the month Elevated BP -- BP is improved when rechecked.    Current Medication: Outpatient Encounter Medications as of 02/02/2024  Medication Sig   alendronate  (FOSAMAX ) 70 MG tablet TAKE 1 TABLET EVERY 7 DAYS WITH A FULL GLASS OF WATER ON AN EMPTY STOMACH DO NOT LIE DOWN FOR AT LEAST 30 MIN   amLODipine  (NORVASC ) 5 MG tablet TAKE 1 TABLET BY MOUTH ONCE DAILY   Apoaequorin (PREVAGEN) 10 MG CAPS Take 10 mg by mouth daily.   bimatoprost (LUMIGAN) 0.01 % SOLN Place 1 drop into the right eye at bedtime.   fluorouracil  (EFUDEX ) 5 % cream Apply twice daily to affected areas on left foot up to 2 weeks   hydrOXYzine  (ATARAX ) 10 MG tablet TAKE ONE TABLET ONCE OR TWICE A DAY IF NEEDED FOR ITCH   losartan (COZAAR) 25 MG tablet Take 25 mg by mouth daily.   metoprolol succinate (TOPROL-XL) 25 MG 24 hr tablet Take 25 mg by mouth daily.   mupirocin  ointment (BACTROBAN ) 2 % Apply 1 Application topically daily. Qd to wounds for a 14 day supply   simvastatin  (ZOCOR ) 20 MG tablet Take 1 tablet (20 mg total) by mouth at bedtime.   [DISCONTINUED] empagliflozin (JARDIANCE) 10 MG TABS tablet Take 10 mg by mouth daily.   [DISCONTINUED] ibandronate (BONIVA) 150 MG tablet Take 150 mg by mouth every 30 (thirty) days. Take in the morning with a full glass of water, on an empty stomach, and do not take anything  else by mouth or lie down for the next 30 min.   No facility-administered encounter medications on file as of 02/02/2024.    Surgical History: Past Surgical History:  Procedure Laterality Date   BREAST EXCISIONAL BIOPSY Left 1972   neg   BREAST EXCISIONAL BIOPSY Right 1980   neg   COLONOSCOPY     ESOPHAGOGASTRODUODENOSCOPY (EGD) WITH PROPOFOL  N/A 04/09/2015   Procedure: ESOPHAGOGASTRODUODENOSCOPY (EGD) WITH PROPOFOL ;  Surgeon: Cassie Click, MD;  Location: [facility] ENDOSCOPY;  Service: Endoscopy;  Laterality: N/A;   skin cancer removal      Medical History: Past Medical History:  Diagnosis Date   Actinic keratosis    Anemia    Basal cell carcinoma 12/23/2007   Upper back.   Basal cell carcinoma 05/17/2009   Right med lower leg   Basal cell carcinoma 07/06/2014   Left medial breast   Basal cell carcinoma 03/01/2015   Right medial mid pretibial   Basal cell carcinoma 07/16/2015   Right superior breast   Basal cell carcinoma 05/21/2016   Left inferior medial breast   Basal cell carcinoma 02/08/2018   Left medial breast   Basal cell carcinoma 11/01/2018   Left medial breast   Basal cell carcinoma 03/30/2019   Left med inf breast   Basal cell carcinoma 09/05/2019   Left proximal bicep   Basal cell carcinoma  12/08/2019   Right forehead. Nodular pattern.   Basal cell carcinoma 12/13/2019   Left med. breast inf. Nodular and infiltrative patterns. EDC   Basal cell carcinoma 12/13/2019   Left med. breast. sup. Nodular pattern. EDC   Basal cell carcinoma 05/14/2020   Right upper back. BCC with focal sclerosis. EDC.   Basal cell carcinoma 12/25/2020   Left inf med breast EDC   Basal cell carcinoma 07/03/2021   Right bicep EDC   Basal cell carcinoma 06/10/2022   left inferior medial breast   Cancer (HCC)    squamous cell   Glaucoma    Hyperlipidemia    Hypertension    Osteoporosis    SCC (squamous cell carcinoma) 12/16/2021   R forearm, EDC   SCC (squamous cell  carcinoma) 12/16/2021   L pretibial, EDC   SCC (squamous cell carcinoma) 04/23/2022   Left mid distal pretibial lat - EDC   SCC (squamous cell carcinoma) 04/23/2022   Left mid distal pretibial med - EDC   SCC (squamous cell carcinoma) 08/28/2022   L distal lat pretibial   SCC (squamous cell carcinoma) 08/28/2022   L proximal pretibial   SCC (squamous cell carcinoma) 12/31/2022   R lower leg anterior, EDC   SCC (squamous cell carcinoma) 12/31/2022   L lower leg anterior, EDC   SCC (squamous cell carcinoma) 12/31/2022   L chest superior, EDC   SCC (squamous cell carcinoma) 12/31/2022   L chest inferior, EDC   SCC (squamous cell carcinoma) 07/02/2023   right middle pretibial sup medial tx EDC   SCC (squamous cell carcinoma) 07/02/2023   right middle pretibial middle tx with ED&C   SCC (squamous cell carcinoma) 07/02/2023   right middle pretibial inferior tx with ED&C   SCC (squamous cell carcinoma) 07/02/2023   right lower leg inferior medial knee tx with ED&C   SCC (squamous cell carcinoma) 12/30/2023   well differentiated - right lateral lower leg - ED&C done   SCC (squamous cell carcinoma) 12/30/2023   left lateral lower leg proximal - ED&C done   SCC (squamous cell carcinoma) 12/30/2023   left lateral lower leg distal near ankle - ed&C done   Squamous cell carcinoma of skin 12/08/2007   Left lower leg. WD SCC   Squamous cell carcinoma of skin 02/01/2008   Left distal med. pretibial. WD SCC   Squamous cell carcinoma of skin 02/01/2008   Right mid pretibial. WD SCC   Squamous cell carcinoma of skin 02/01/2008   Right mid pretibal inferior. WD SCC with superficial infiltration.   Squamous cell carcinoma of skin 04/05/2008   Left dorsum hand. WD SCC with superficial infiltration.   Squamous cell carcinoma of skin 06/15/2008   Right lower leg inferior. WD SCC   Squamous cell carcinoma of skin 06/15/2008   Right lower leg superior. SCCis hypertrophic   Squamous cell carcinoma  of skin 07/05/2008   Right lat. lower leg, Right lower leg, Left lower leg   Squamous cell carcinoma of skin 08/04/2008   Right ant. lower leg, Left med. lower leg   Squamous cell carcinoma of skin 09/01/2008   Right post. lower leg   Squamous cell carcinoma of skin 10/04/2008   Left post leg   Squamous cell carcinoma of skin 12/01/2008   Left lower leg   Squamous cell carcinoma of skin 03/08/2009   Right ant. mid lower leg, Right lower leg   Squamous cell carcinoma of skin 05/17/2009   Left lower post. leg superior  Squamous cell carcinoma of skin 10/11/2009   Left lower leg. KA-pattern   Squamous cell carcinoma of skin 03/28/2010   Right lower leg   Squamous cell carcinoma of skin 03/25/2013   Right lower leg   Squamous cell carcinoma of skin 06/23/2013   Right post. lower leg. Right medial lower leg. Left medial lower leg   Squamous cell carcinoma of skin 09/09/2013   Left anterior upper arm. Right anterior lower leg. Right lat. lower leg. Right below knee lower leg   Squamous cell carcinoma of skin 12/08/2013   Right posterior lower leg sup. Right forearm   Squamous cell carcinoma of skin 03/03/2014   Right posterior leg. Right med. lower leg   Squamous cell carcinoma of skin 06/22/2014   Right to of shoulder anterior. Right top of shoulder posterior   Squamous cell carcinoma of skin 08/03/2014   Right anterior forearm   Squamous cell carcinoma of skin 09/14/2014   Right chest   Squamous cell carcinoma of skin 10/30/2014   Right calf   Squamous cell carcinoma of skin 12/14/2014   Right med. distal pretibial sup. Right med distal pretibial inf. Left proximal bicep sup. Left proximal bicep inf.   Squamous cell carcinoma of skin 03/01/2015   Left lat proximal pretibial near knee   Squamous cell carcinoma of skin 03/29/2015   Left mid to prox. pretibial. Left mid. lat. pretibial.    Squamous cell carcinoma of skin 04/19/2015   Right mid calf. Right distal lat. pretibial    Squamous cell carcinoma of skin 05/31/2015   Right proximal med. pretibial ant. Right proximal med. pretibial posterior   Squamous cell carcinoma of skin 07/16/2015   Right upper arm inf. deltoid   Squamous cell carcinoma of skin 08/06/2015   Left distal pretibial   Squamous cell carcinoma of skin 09/12/2015   Left postauricular area   Squamous cell carcinoma of skin 12/12/2015   Left proximal pretibial x4 sites   Squamous cell carcinoma of skin 12/31/2015   Right med. lower leg above ankle sup. Right med. lower leg above ankle inf. Left inf. lat. calf med. Left inf. calf lat.   Squamous cell carcinoma of skin 01/31/2016   Right med. sup. ankle area. Right med mid proximal calf. Left dorsum foot.    Squamous cell carcinoma of skin 03/17/2016   Left post. distal calf lateral. Left post distal calf med. Left mid lat ant. thigh. Right mid lat. calf   Squamous cell carcinoma of skin 05/21/2016   Right proximal medial pretibial. Right mid med. pretibial.   Squamous cell carcinoma of skin 07/03/2016   Right mid med. pretibial. Right proximal med. pretibial   Squamous cell carcinoma of skin 08/20/2016   Right medial calf. Left distal pretibial. Right upper back paraspinal   Squamous cell carcinoma of skin 10/30/2016   Right medial calf x2   Squamous cell carcinoma of skin 12/10/2016   left mid back 6.0cm lat to spine. Left sup. lat. calf. Left inf. post. calf   Squamous cell carcinoma of skin 12/25/2016   Right medial above ankle. Right mid pretibial. Right prox. pretibial. Right prox. pretibial med.    Squamous cell carcinoma of skin 01/19/2017   Left forearm. Left ant. neck   Squamous cell carcinoma of skin 03/23/2017   Left med. pretibial below knee   Squamous cell carcinoma of skin 04/13/2017   Right lat. sup. ankle. Right medial ankle   Squamous cell carcinoma of skin 04/27/2017   Left  med. distal prox. calf. Left medial distal calf. Left lat. distal prox. calf. Left lat distal  distal calf   Squamous cell carcinoma of skin 05/14/2017   Left bicep. Left dorsum mid forearm   Squamous cell carcinoma of skin 06/25/2017   Right med. distal calf x3   Squamous cell carcinoma of skin 09/02/2017   Left med. infraclavicular   Squamous cell carcinoma of skin 12/21/2017   Left prox. med. lower leg. Right forearm. Left forearm. Left bicep   Squamous cell carcinoma of skin 01/07/2018   Left dorsum foot prox. Left dorsum foot distal. Left mid to distal calf. Right chest   Squamous cell carcinoma of skin 03/08/2018   Left bicep. Right proximal dorsum forearm. Right distal med. pretibial sup. Right distal med pretibial inf.    Squamous cell carcinoma of skin 04/05/2018   Right sup. med scapula   Squamous cell carcinoma of skin 06/03/2018   Left med. ankle sup. Left med. ankle inf. Left lateral ankle.   Squamous cell carcinoma of skin 09/01/2018   Right mid lat. calf. Right lat. knee. Prox post calf   Squamous cell carcinoma of skin 11/01/2018   Left lateral ankle   Squamous cell carcinoma of skin 12/08/2018   Right lateral ankle lat malleolus   Squamous cell carcinoma of skin 01/05/2019   Right lateral elbow. Left mid medial calf. Left prox. ant. thigh. Right distal lat tricep   Squamous cell carcinoma of skin 01/19/2019   Right popliteal   Squamous cell carcinoma of skin 02/03/2019   Left mid lat calf. Left mid calf. Left dorsum lat distal foot. Left dorsum distal foot   Squamous cell carcinoma of skin 03/30/2019   Right lower leg above med ankle ant. Right lower leg above ankle sup. Right lower leg above the med ankle inf.   Squamous cell carcinoma of skin 05/12/2019   Right chest medial. Left deltoid   Squamous cell carcinoma of skin 06/23/2019   Right ant thigh distal above knee. Left med distal thigh. Left ant thigh above knee   Squamous cell carcinoma of skin 07/20/2019   Right chest. Right dorsal hand.   Squamous cell carcinoma of skin 08/03/2019   Left distal  lat pretibial. Left distal med calf. Right prox. lat calf   Squamous cell carcinoma of skin 09/05/2019   Right middle bicep. Right sup lat bicep. Right sup mid bicep. Right sup med bicep   Squamous cell carcinoma of skin 09/15/2019   Right proximal med pretibial. Left distal lat thigh   Squamous cell carcinoma of skin 04/02/2020   Right calf, bleow right medial knee sup, below right medial knee post, below right medial knee inf   Squamous cell carcinoma of skin 05/14/2020   Right ant. shoulder. Mod. Diff. SCC. EDC   Squamous cell carcinoma of skin 06/27/2020   R mid to distal pretibial - ED&C   Squamous cell carcinoma of skin 06/27/2020   Below the R knee - ED&C    Squamous cell carcinoma of skin 06/27/2020   R prox pretibial    Squamous cell carcinoma of skin 06/27/2020   L distal med calf   Squamous cell carcinoma of skin 08/06/2020   Left ant deltoid   Squamous cell carcinoma of skin 12/25/2020   Left lat ankle EDC   Squamous cell carcinoma of skin 03/27/2021   left knee, EDC   Squamous cell carcinoma of skin 03/27/2021   left lateral leg, EDC   Squamous cell carcinoma of  skin 07/03/2021   Left lat neck - EDC   Squamous cell carcinoma of skin 07/03/2021   Right ant thigh - EDC   Squamous cell carcinoma of skin 07/03/2021   Right prox lat pretibial - EDC   Squamous cell carcinoma of skin 10/16/2021   right med mid calf, EDC   Squamous cell carcinoma of skin 10/16/2021   left medial prox pretibial, EDC   Squamous cell carcinoma of skin 10/16/2021   left distal pretibial, EDC   Squamous cell carcinoma of skin 03/26/2023   Left ant lat ankle EDC   Squamous cell carcinoma of skin 03/26/2023   Left medial lower leg above ankle EDC EDC   Squamous cell carcinoma of skin 03/26/2023   Right lat ankle sup EDC   Squamous cell carcinoma of skin 03/26/2023   Right lat ankle middle EDC   Squamous cell carcinoma of skin 03/26/2023   Right lat ankle inf EDC   Squamous cell carcinoma  of skin 10/15/2023   left distal pretibial, EDC   Squamous cell carcinoma of skin 10/15/2023   left proximal pretibial, EDC   Squamous cell carcinoma of skin 10/15/2023   right middle medial lower leg, EDC    Family History: Family History  Problem Relation Age of Onset   Breast cancer Sister 30   Atrial fibrillation Mother     Social History   Socioeconomic History   Marital status: Married    Spouse name: Not on file   Number of children: Not on file   Years of education: Not on file   Highest education level: Not on file  Occupational History   Not on file  Tobacco Use   Smoking status: Never   Smokeless tobacco: Never  Vaping Use   Vaping status: Never Used  Substance and Sexual Activity   Alcohol use: No   Drug use: No   Sexual activity: Not on file  Other Topics Concern   Not on file  Social History Narrative   Not on file   Social Drivers of Health   Financial Resource Strain: Not on file  Food Insecurity: Not on file  Transportation Needs: Not on file  Physical Activity: Not on file  Stress: Not on file  Social Connections: Not on file  Intimate Partner Violence: Not on file      Review of Systems  Constitutional:  Negative for fatigue and fever.  HENT:  Negative for congestion, mouth sores and postnasal drip.   Respiratory:  Negative for cough.   Cardiovascular:  Negative for chest pain.  Genitourinary:  Negative for flank pain.  Psychiatric/Behavioral: Negative.      Vital Signs: BP 138/80 Comment: 150/90  Pulse 99   Temp 98.1 F (36.7 C)   Resp 16   Ht 5' 2 (1.575 m)   Wt 106 lb 3.2 oz (48.2 kg)   SpO2 95%   BMI 19.42 kg/m    Physical Exam Vitals reviewed.  Constitutional:      Appearance: Normal appearance.  HENT:     Head: Normocephalic and atraumatic.     Nose: Nose normal.     Mouth/Throat:     Mouth: Mucous membranes are moist.     Pharynx: No posterior oropharyngeal erythema.   Eyes:     Extraocular Movements:  Extraocular movements intact.     Pupils: Pupils are equal, round, and reactive to light.    Cardiovascular:     Pulses: Normal pulses.     Heart sounds:  Normal heart sounds.  Pulmonary:     Effort: Pulmonary effort is normal.     Breath sounds: Normal breath sounds.   Neurological:     General: No focal deficit present.     Mental Status: She is alert.   Psychiatric:        Mood and Affect: Mood normal.        Behavior: Behavior normal.        Assessment/Plan: 1. Chronic kidney disease (CKD) stage G3b/A1, moderately decreased glomerular filtration rate (GFR) between 30-44 mL/min/1.73 square meter and albuminuria creatinine ratio less than 30 mg/g (HCC) Urine microalbumin/creatinine ratio sent to lab. Patient given lab requisition for lab to check kidney function.  - Urine Microalbumin w/creat. ratio  2. 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 Mixed hyperlipidemia Continue simvastatin  as prescribed.    General Counseling: Kelle verbalizes understanding of the findings of todays visit and agrees with plan of treatment. I have discussed any further diagnostic evaluation that may be needed or ordered today. We also reviewed her medications today. she has been encouraged to call the office with any questions or concerns that should arise related to todays visit.    Orders Placed This Encounter  Procedures   Urine Microalbumin w/creat. ratio    No orders of the defined types were placed in this encounter.   Return for yearly in september. .   Total time spent:30 Minutes Time spent includes review of chart, medications, test results, and follow up plan with the patient.   [place] Controlled Substance Database was reviewed by me.  This patient was seen by Laurence Pons, FNP-C in collaboration with Dr. Verneta Gone as a part of collaborative care agreement.   Jakarri Lesko R. Bobbi Burow, MSN,  FNP-C Internal medicine

## 2024-02-03 LAB — BASIC METABOLIC PANEL WITH GFR
BUN/Creatinine Ratio: 11 — ABNORMAL LOW (ref 12–28)
BUN: 12 mg/dL (ref 8–27)
CO2: 22 mmol/L (ref 20–29)
Calcium: 9.4 mg/dL (ref 8.7–10.3)
Chloride: 104 mmol/L (ref 96–106)
Creatinine, Ser: 1.09 mg/dL — ABNORMAL HIGH (ref 0.57–1.00)
Glucose: 101 mg/dL — ABNORMAL HIGH (ref 70–99)
Potassium: 4.2 mmol/L (ref 3.5–5.2)
Sodium: 143 mmol/L (ref 134–144)
eGFR: 52 mL/min/{1.73_m2} — ABNORMAL LOW (ref >59)

## 2024-02-03 LAB — MICROALBUMIN / CREATININE URINE RATIO
Creatinine, Urine: 32.2 mg/dL
Microalb/Creat Ratio: 99 mg/g{creat} — ABNORMAL HIGH (ref 0–29)
Microalbumin, Urine: 31.9 ug/mL

## 2024-03-02 DIAGNOSIS — H401112 Primary open-angle glaucoma, right eye, moderate stage: Secondary | ICD-10-CM | POA: Diagnosis not present

## 2024-03-02 DIAGNOSIS — H401121 Primary open-angle glaucoma, left eye, mild stage: Secondary | ICD-10-CM | POA: Diagnosis not present

## 2024-03-10 DIAGNOSIS — H2513 Age-related nuclear cataract, bilateral: Secondary | ICD-10-CM | POA: Diagnosis not present

## 2024-03-10 DIAGNOSIS — H401112 Primary open-angle glaucoma, right eye, moderate stage: Secondary | ICD-10-CM | POA: Diagnosis not present

## 2024-03-10 DIAGNOSIS — H401121 Primary open-angle glaucoma, left eye, mild stage: Secondary | ICD-10-CM | POA: Diagnosis not present

## 2024-03-14 ENCOUNTER — Emergency Department

## 2024-03-14 ENCOUNTER — Other Ambulatory Visit: Payer: Self-pay

## 2024-03-14 ENCOUNTER — Inpatient Hospital Stay
Admission: EM | Admit: 2024-03-14 | Discharge: 2024-03-16 | DRG: 308 | Disposition: A | Discharge status: Home / Self Care | Attending: Hospitalist | Admitting: Hospitalist

## 2024-03-14 ENCOUNTER — Encounter: Payer: Self-pay | Admitting: Emergency Medicine

## 2024-03-14 DIAGNOSIS — I5023 Acute on chronic systolic (congestive) heart failure: Secondary | ICD-10-CM | POA: Diagnosis present

## 2024-03-14 DIAGNOSIS — I2489 Other forms of acute ischemic heart disease: Secondary | ICD-10-CM | POA: Diagnosis not present

## 2024-03-14 DIAGNOSIS — R197 Diarrhea, unspecified: Secondary | ICD-10-CM | POA: Diagnosis not present

## 2024-03-14 DIAGNOSIS — H409 Unspecified glaucoma: Secondary | ICD-10-CM | POA: Diagnosis present

## 2024-03-14 DIAGNOSIS — I5022 Chronic systolic (congestive) heart failure: Secondary | ICD-10-CM | POA: Diagnosis not present

## 2024-03-14 DIAGNOSIS — I130 Hypertensive heart and chronic kidney disease with heart failure and stage 1 through stage 4 chronic kidney disease, or unspecified chronic kidney disease: Secondary | ICD-10-CM | POA: Diagnosis present

## 2024-03-14 DIAGNOSIS — R918 Other nonspecific abnormal finding of lung field: Secondary | ICD-10-CM | POA: Diagnosis not present

## 2024-03-14 DIAGNOSIS — I509 Heart failure, unspecified: Secondary | ICD-10-CM | POA: Diagnosis not present

## 2024-03-14 DIAGNOSIS — Z66 Do not resuscitate: Secondary | ICD-10-CM | POA: Diagnosis present

## 2024-03-14 DIAGNOSIS — J9811 Atelectasis: Secondary | ICD-10-CM | POA: Diagnosis not present

## 2024-03-14 DIAGNOSIS — I110 Hypertensive heart disease with heart failure: Secondary | ICD-10-CM | POA: Diagnosis not present

## 2024-03-14 DIAGNOSIS — M810 Age-related osteoporosis without current pathological fracture: Secondary | ICD-10-CM | POA: Diagnosis present

## 2024-03-14 DIAGNOSIS — E785 Hyperlipidemia, unspecified: Secondary | ICD-10-CM | POA: Diagnosis present

## 2024-03-14 DIAGNOSIS — Z883 Allergy status to other anti-infective agents status: Secondary | ICD-10-CM

## 2024-03-14 DIAGNOSIS — R531 Weakness: Secondary | ICD-10-CM | POA: Diagnosis not present

## 2024-03-14 DIAGNOSIS — I4891 Unspecified atrial fibrillation: Secondary | ICD-10-CM | POA: Diagnosis not present

## 2024-03-14 DIAGNOSIS — Z79899 Other long term (current) drug therapy: Secondary | ICD-10-CM

## 2024-03-14 DIAGNOSIS — N1832 Chronic kidney disease, stage 3b: Secondary | ICD-10-CM | POA: Diagnosis present

## 2024-03-14 DIAGNOSIS — R0609 Other forms of dyspnea: Secondary | ICD-10-CM | POA: Diagnosis not present

## 2024-03-14 DIAGNOSIS — Z7901 Long term (current) use of anticoagulants: Secondary | ICD-10-CM

## 2024-03-14 DIAGNOSIS — R9431 Abnormal electrocardiogram [ECG] [EKG]: Secondary | ICD-10-CM | POA: Diagnosis not present

## 2024-03-14 DIAGNOSIS — R42 Dizziness and giddiness: Secondary | ICD-10-CM | POA: Diagnosis not present

## 2024-03-14 DIAGNOSIS — R079 Chest pain, unspecified: Secondary | ICD-10-CM | POA: Diagnosis not present

## 2024-03-14 DIAGNOSIS — Z7983 Long term (current) use of bisphosphonates: Secondary | ICD-10-CM

## 2024-03-14 DIAGNOSIS — Z9889 Other specified postprocedural states: Secondary | ICD-10-CM

## 2024-03-14 DIAGNOSIS — Z85828 Personal history of other malignant neoplasm of skin: Secondary | ICD-10-CM

## 2024-03-14 DIAGNOSIS — I5020 Unspecified systolic (congestive) heart failure: Secondary | ICD-10-CM | POA: Diagnosis not present

## 2024-03-14 DIAGNOSIS — J90 Pleural effusion, not elsewhere classified: Secondary | ICD-10-CM | POA: Diagnosis not present

## 2024-03-14 DIAGNOSIS — R911 Solitary pulmonary nodule: Secondary | ICD-10-CM | POA: Diagnosis present

## 2024-03-14 DIAGNOSIS — Z803 Family history of malignant neoplasm of breast: Secondary | ICD-10-CM

## 2024-03-14 DIAGNOSIS — Z8249 Family history of ischemic heart disease and other diseases of the circulatory system: Secondary | ICD-10-CM

## 2024-03-14 DIAGNOSIS — R000 Tachycardia, unspecified: Secondary | ICD-10-CM | POA: Diagnosis not present

## 2024-03-14 DIAGNOSIS — I10 Essential (primary) hypertension: Secondary | ICD-10-CM | POA: Diagnosis not present

## 2024-03-14 DIAGNOSIS — R0789 Other chest pain: Secondary | ICD-10-CM | POA: Diagnosis not present

## 2024-03-14 LAB — TROPONIN I (HIGH SENSITIVITY)
Troponin I (High Sensitivity): 41 ng/L — ABNORMAL HIGH (ref <18)
Troponin I (High Sensitivity): 22 ng/L — ABNORMAL HIGH (ref <18)

## 2024-03-14 LAB — BASIC METABOLIC PANEL WITH GFR
Anion gap: 16 — ABNORMAL HIGH (ref 5–15)
BUN: 24 mg/dL — ABNORMAL HIGH (ref 8–23)
CO2: 21 mmol/L — ABNORMAL LOW (ref 22–32)
Calcium: 9 mg/dL (ref 8.9–10.3)
Chloride: 103 mmol/L (ref 98–111)
Creatinine, Ser: 1.36 mg/dL — ABNORMAL HIGH (ref 0.44–1.00)
GFR, Estimated: 40 mL/min — ABNORMAL LOW (ref >60)
Glucose, Bld: 124 mg/dL — ABNORMAL HIGH (ref 70–99)
Potassium: 4.2 mmol/L (ref 3.5–5.1)
Sodium: 140 mmol/L (ref 135–145)

## 2024-03-14 LAB — CBC
HCT: 39 % (ref 36.0–46.0)
Hemoglobin: 12 g/dL (ref 12.0–15.0)
MCH: 28.4 pg (ref 26.0–34.0)
MCHC: 30.8 g/dL (ref 30.0–36.0)
MCV: 92.2 fL (ref 80.0–100.0)
Platelets: 263 K/uL (ref 150–400)
RBC: 4.23 MIL/uL (ref 3.87–5.11)
RDW: 15.4 % (ref 11.5–15.5)
WBC: 8.8 K/uL (ref 4.0–10.5)
nRBC: 0 % (ref 0.0–0.2)

## 2024-03-14 LAB — D-DIMER, QUANTITATIVE: D-Dimer, Quant: 1.71 ug{FEU}/mL — ABNORMAL HIGH (ref 0.00–0.50)

## 2024-03-14 LAB — HEPATIC FUNCTION PANEL
ALT: 67 U/L — ABNORMAL HIGH (ref 0–44)
AST: 90 U/L — ABNORMAL HIGH (ref 15–41)
Albumin: 3.5 g/dL (ref 3.5–5.0)
Alkaline Phosphatase: 93 U/L (ref 38–126)
Bilirubin, Direct: 0.2 mg/dL (ref 0.0–0.2)
Indirect Bilirubin: 0.6 mg/dL (ref 0.3–0.9)
Total Bilirubin: 0.8 mg/dL (ref 0.0–1.2)
Total Protein: 6.5 g/dL (ref 6.5–8.1)

## 2024-03-14 LAB — TSH: TSH: 3.548 u[IU]/mL (ref 0.350–4.500)

## 2024-03-14 LAB — BRAIN NATRIURETIC PEPTIDE: B Natriuretic Peptide: 1470.6 pg/mL — ABNORMAL HIGH (ref 0.0–100.0)

## 2024-03-14 LAB — MAGNESIUM: Magnesium: 1.9 mg/dL (ref 1.7–2.4)

## 2024-03-14 LAB — LIPASE, BLOOD: Lipase: 33 U/L (ref 11–51)

## 2024-03-14 LAB — T4, FREE: Free T4: 1.21 ng/dL — ABNORMAL HIGH (ref 0.61–1.12)

## 2024-03-14 MED ORDER — AMIODARONE HCL IN DEXTROSE 360-4.14 MG/200ML-% IV SOLN
60.0000 mg/h | INTRAVENOUS | Status: DC
  Administered 2024-03-14 (×2): 60 mg/h via INTRAVENOUS
  Filled 2024-03-14 (×2): qty 200

## 2024-03-14 MED ORDER — DOCUSATE SODIUM 100 MG PO CAPS
100.0000 mg | ORAL_CAPSULE | Freq: Two times a day (BID) | ORAL | Status: DC
  Administered 2024-03-14 – 2024-03-16 (×4): 100 mg via ORAL
  Filled 2024-03-14 (×4): qty 1

## 2024-03-14 MED ORDER — SODIUM CHLORIDE 0.9 % IV BOLUS
500.0000 mL | Freq: Once | INTRAVENOUS | Status: AC
  Administered 2024-03-14: 500 mL via INTRAVENOUS

## 2024-03-14 MED ORDER — AMIODARONE HCL IN DEXTROSE 360-4.14 MG/200ML-% IV SOLN
30.0000 mg/h | INTRAVENOUS | Status: DC
  Administered 2024-03-14 – 2024-03-15 (×3): 30 mg/h via INTRAVENOUS
  Filled 2024-03-14 (×2): qty 200

## 2024-03-14 MED ORDER — FUROSEMIDE 10 MG/ML IJ SOLN
40.0000 mg | Freq: Two times a day (BID) | INTRAMUSCULAR | Status: DC
  Administered 2024-03-14 – 2024-03-15 (×2): 40 mg via INTRAVENOUS
  Filled 2024-03-14 (×2): qty 4

## 2024-03-14 MED ORDER — SIMVASTATIN 20 MG PO TABS
20.0000 mg | ORAL_TABLET | Freq: Every day | ORAL | Status: DC
  Administered 2024-03-14 – 2024-03-15 (×2): 20 mg via ORAL
  Filled 2024-03-14: qty 1
  Filled 2024-03-14: qty 2

## 2024-03-14 MED ORDER — DILTIAZEM HCL 25 MG/5ML IV SOLN
10.0000 mg | Freq: Once | INTRAVENOUS | Status: AC
  Administered 2024-03-14: 10 mg via INTRAVENOUS
  Filled 2024-03-14: qty 5

## 2024-03-14 MED ORDER — IOHEXOL 350 MG/ML SOLN
60.0000 mL | Freq: Once | INTRAVENOUS | Status: AC | PRN
  Administered 2024-03-14: 60 mL via INTRAVENOUS

## 2024-03-14 MED ORDER — HEPARIN SODIUM (PORCINE) 5000 UNIT/ML IJ SOLN
5000.0000 [IU] | Freq: Three times a day (TID) | INTRAMUSCULAR | Status: DC
  Administered 2024-03-14: 5000 [IU] via SUBCUTANEOUS
  Filled 2024-03-14: qty 1

## 2024-03-14 MED ORDER — DILTIAZEM HCL-DEXTROSE 125-5 MG/125ML-% IV SOLN (PREMIX)
5.0000 mg/h | INTRAVENOUS | Status: DC
  Administered 2024-03-14: 15 mg/h via INTRAVENOUS
  Filled 2024-03-14: qty 125

## 2024-03-14 MED ORDER — AMIODARONE LOAD VIA INFUSION
150.0000 mg | Freq: Once | INTRAVENOUS | Status: AC
  Administered 2024-03-14: 150 mg via INTRAVENOUS
  Filled 2024-03-14: qty 83.34

## 2024-03-14 MED ORDER — ACETAMINOPHEN 325 MG PO TABS: 650.0000 mg | ORAL_TABLET | Freq: Four times a day (QID) | ORAL | Status: DC | PRN

## 2024-03-14 MED ORDER — ACETAMINOPHEN 650 MG RE SUPP: 650.0000 mg | Freq: Four times a day (QID) | RECTAL | Status: DC | PRN

## 2024-03-14 MED ORDER — HYDROCODONE-ACETAMINOPHEN 5-325 MG PO TABS: 1.0000 | ORAL_TABLET | ORAL | Status: DC | PRN

## 2024-03-14 MED ORDER — APIXABAN 5 MG PO TABS
5.0000 mg | ORAL_TABLET | Freq: Two times a day (BID) | ORAL | Status: DC
  Administered 2024-03-14 – 2024-03-16 (×4): 5 mg via ORAL
  Filled 2024-03-14 (×4): qty 1

## 2024-03-14 NOTE — ED Triage Notes (Signed)
 Patient to ED from cardiology for tachycardia. Pt reports newly dx with afib. Denies CP. States SOB when walking up stairs.

## 2024-03-14 NOTE — Progress Notes (Signed)
 Established Patient Visit   Chief Complaint: Chief Complaint  Patient presents with  . Palpitations    Multiple episodes   . Shortness of Breath    When walking up stairs   . Dizziness    Patient felt like she was going to pass out    Date of Service: 03/14/2024 Date of Birth: [birth year] PCP: Fernand Sigrid HERO, MD [address] [place] [address]  History of Present Illness:   Ms. Rochefort is a 78 y.o.female patient that presents for same day visit.  PMH: HFrEF  HLD HTN  Last seen by Dr. Dewane 10/16/23.    Three days ago, she woke up in the morning with palpitations. She felt weak, dizzy, dyspnea with exertion. Denies chest pain. She also reports several episodes of watery diarrhea, which she describes as clear fluid from the rectum, that occurred without prodromal symptoms, after the onset of palpitations that morning. She had several episodes of diarrhea on Saturday. This resolved. Denies fever, vomiting, cough. She is not feeling palpitations today, resolved yesterday. She scheduled same day visit today due to continuing to feel weak. Low appetite. She denies edema. EKG today reveals atrial fibrillation with RVR, HR 145 bpm, T wave inversion in V5-V6.  08/25/2023 ECHO: MODERATE LEFT VENTRICULAR SYSTOLIC DYSFUNCTION WITH NO LVH ESTIMATED EF: 30%, CALC EF(2D): 35% NORMAL LA PRESSURES WITH NORMAL DIASTOLIC FUNCTION NORMAL RIGHT VENTRICULAR SYSTOLIC FUNCTION VALVULAR REGURGITATION: MILD AR, MODERATE MR, MILD PR, SEVERE TR NO VALVULAR STENOSIS   Past Medical and Surgical History  Past Medical History Past Medical History:  Diagnosis Date  . Anemia   . Hyperlipidemia   . Hypertension   . Low vitamin B12 level   . Low vitamin B12 level 03/10/2015  . Osteoporosis   . Squamous cell carcinoma of ear     Past Surgical History She has a past surgical history that includes Breast excisional biopsy (Bilateral); Colonoscopy (12/03/2007); and egd (04/09/2015).    Medications and Allergies  Current Medications  Current Outpatient Medications on File Prior to Visit  Medication Sig Dispense Refill  . alendronate  (FOSAMAX ) 70 MG tablet Take 70 mg by mouth every 7 (seven) days    . amLODIPine  (NORVASC ) 5 MG tablet Take 5 mg by mouth once daily    . apoaequorin (PREVAGEN) capsule Take 10 mg by mouth once daily    . bimatoprost (LUMIGAN) 0.01 % ophthalmic solution Place 1 drop into the right eye at bedtime    . cyanocobalamin  (VITAMIN B12) 1000 MCG tablet Take 1,000 mcg by mouth once daily.    . hydrOXYzine  HCL (ATARAX ) 10 MG tablet Take 10 mg by mouth 2 (two) times daily as needed for Itching    . ibandronate (BONIVA) 150 mg tablet Take 150 mg by mouth every 30 (thirty) days. Take with a full glass of water. Do not lie down for the next 60 min.    . losartan  (COZAAR ) 25 MG tablet Take 1 tablet (25 mg total) by mouth at bedtime 30 tablet 11  . metoprolol  SUCCinate (TOPROL -XL) 25 MG XL tablet Take 1 tablet (25 mg total) by mouth once daily 30 tablet 11  . mupirocin  (BACTROBAN ) 2 % ointment Apply 1 Application topically    . simvastatin  (ZOCOR ) 20 MG tablet Take 20 mg by mouth nightly.    . bimatoprost (LATISSE) 0.03 % ophthalmic solution Place 1 drop into the right eye nightly. Place one drop on applicator and apply evenly at the base of eyelashes nightly. Repeat procedure for the  other eye. (Patient not taking: Reported on 10/16/2023)    . fluorouraciL  (EFUDEX ) 5 % cream Apply twice daily to affected areas on left foot up to 2 weeks (Patient not taking: Reported on 03/14/2024)     No current facility-administered medications on file prior to visit.    Allergies: Neomycin-bacitracin-polymyxin  Social and Family History  Social History  reports that she has never smoked. She does not have any smokeless tobacco history on file.  Family History No family history on file.  Review of Systems   Review of Systems  Constitutional:  Positive for weight  loss.       Denies weight gain  Respiratory:  Positive for shortness of breath.   Cardiovascular:  Positive for palpitations. Negative for chest pain and leg swelling.  Gastrointestinal:  Positive for diarrhea.  Endo/Heme/Allergies:  Does not bruise/bleed easily.      Physical Examination   Vitals:BP 110/78 (BP Location: Left upper arm, Patient Position: Sitting, BP Cuff Size: Adult)   Pulse 75   Resp 14   Ht 157.5 cm (5' 2)   Wt 48.3 kg (106 lb 6.4 oz)   SpO2 98%   BMI 19.46 kg/m  Ht:157.5 cm (5' 2) Wt:48.3 kg (106 lb 6.4 oz) ADJ:Anib surface area is 1.45 meters squared. Body mass index is 19.46 kg/m.  Physical Exam Vitals reviewed.  Constitutional:      General: She is not in acute distress.    Appearance: Normal appearance.  Cardiovascular:     Rate and Rhythm: Tachycardia present. Rhythm irregularly irregular.     Heart sounds: Normal heart sounds. No murmur heard. Pulmonary:     Effort: Pulmonary effort is normal. No respiratory distress.     Breath sounds: Normal breath sounds.  Musculoskeletal:     Right lower leg: No edema.     Left lower leg: No edema.  Neurological:     Mental Status: She is alert.     Assessment   78 y.o. female with  Encounter Diagnoses  Name Primary?  . Atrial fibrillation with RVR (CMS/HHS-HCC) Yes  . HFrEF (heart failure with reduced ejection fraction) (CMS/HHS-HCC)   . Diarrhea, unspecified type   . Essential hypertension   . Hyperlipidemia, unspecified hyperlipidemia type   . DOE (dyspnea on exertion)   . Weakness generalized   . T wave inversion in EKG     Plan   Orders Placed This Encounter  Procedures  . ECG 12-lead   Refer to ED with new AF with RVR, HR 145 bpm by EKG. Soft BP 110/78 with known HFrEF. T wave inversions in V5-V6. Pt with watery diarrhea over the weekend. Needs evaluation of electrolytes, IV fluids, repeat ECHO, and IV rate control with close monitoring of BP.  If does not convert to NSR, would  benefit from TEE/DCCV inpatient or outpatient.   Follow up after hospital.   Attestation Statement:   I personally performed the service, non-incident to. [facility])   Amori TINNIE LAUNIE MAIDEN, NP

## 2024-03-14 NOTE — H&P (Signed)
 History and Physical    Patient: Joann Warren FMW:[id number] DOB: 06-04-1946 DOA: 03/14/2024 DOS: the patient was seen and examined on 03/14/2024 PCP: Liana Fish, NP  Patient coming from: Home  Chief Complaint:  Chief Complaint  Patient presents with   Tachycardia   HPI: Joann Warren is a 78 y.o. female with medical history significant of heart failure with reduced EF, hyperlipidemia, hypertension, CKD 3B, who presented to cardiology clinic today complaining of dyspnea on exertion, dizziness. She does report recent diarrheal illness which has since resoled. In cardiology office she was found to be in atrial fibrillation RVR, heart rate 145, T wave inversion seen in V5 and V6.  She was sent to the ER for further evaluation. On arrival to the ED patient received diltiazem  which initially improved her heart rate however RVR recurred.  Labs in ED revealed creatinine of 1.36, mildly elevated BUN of 24, AST 90, ALT 67.  BNP 1470.  Initial troponin 41 with repeat 22.  No abnormalities on CBC.  TSH within normal limits.  CTA with cardiac enlargement, bilateral pleural effusions with bilateral perihilar and basilar infiltration.  Noted 4 mm nodule in right lung.  On my evaluation patient denies shortness of breath, chest pain, or swelling. She is sitting comfortably in stretcher with HR 120s. She has no acute complaints.   Review of Systems: As mentioned in the history of present illness. All other systems reviewed and are negative. Past Medical History:  Diagnosis Date   Actinic keratosis    Anemia    Basal cell carcinoma 12/23/2007   Upper back.   Basal cell carcinoma 05/17/2009   Right med lower leg   Basal cell carcinoma 07/06/2014   Left medial breast   Basal cell carcinoma 03/01/2015   Right medial mid pretibial   Basal cell carcinoma 07/16/2015   Right superior breast   Basal cell carcinoma 05/21/2016   Left inferior medial breast   Basal cell carcinoma 02/08/2018    Left medial breast   Basal cell carcinoma 11/01/2018   Left medial breast   Basal cell carcinoma 03/30/2019   Left med inf breast   Basal cell carcinoma 09/05/2019   Left proximal bicep   Basal cell carcinoma 12/08/2019   Right forehead. Nodular pattern.   Basal cell carcinoma 12/13/2019   Left med. breast inf. Nodular and infiltrative patterns. EDC   Basal cell carcinoma 12/13/2019   Left med. breast. sup. Nodular pattern. EDC   Basal cell carcinoma 05/14/2020   Right upper back. BCC with focal sclerosis. EDC.   Basal cell carcinoma 12/25/2020   Left inf med breast EDC   Basal cell carcinoma 07/03/2021   Right bicep EDC   Basal cell carcinoma 06/10/2022   left inferior medial breast   Cancer (HCC)    squamous cell   Glaucoma    Hyperlipidemia    Hypertension    Osteoporosis    SCC (squamous cell carcinoma) 12/16/2021   R forearm, EDC   SCC (squamous cell carcinoma) 12/16/2021   L pretibial, EDC   SCC (squamous cell carcinoma) 04/23/2022   Left mid distal pretibial lat - EDC   SCC (squamous cell carcinoma) 04/23/2022   Left mid distal pretibial med - EDC   SCC (squamous cell carcinoma) 08/28/2022   L distal lat pretibial   SCC (squamous cell carcinoma) 08/28/2022   L proximal pretibial   SCC (squamous cell carcinoma) 12/31/2022   R lower leg anterior, EDC   SCC (squamous cell carcinoma)  12/31/2022   L lower leg anterior, EDC   SCC (squamous cell carcinoma) 12/31/2022   L chest superior, EDC   SCC (squamous cell carcinoma) 12/31/2022   L chest inferior, EDC   SCC (squamous cell carcinoma) 07/02/2023   right middle pretibial sup medial tx EDC   SCC (squamous cell carcinoma) 07/02/2023   right middle pretibial middle tx with ED&C   SCC (squamous cell carcinoma) 07/02/2023   right middle pretibial inferior tx with ED&C   SCC (squamous cell carcinoma) 07/02/2023   right lower leg inferior medial knee tx with ED&C   SCC (squamous cell carcinoma) 12/30/2023   well  differentiated - right lateral lower leg - ED&C done   SCC (squamous cell carcinoma) 12/30/2023   left lateral lower leg proximal - ED&C done   SCC (squamous cell carcinoma) 12/30/2023   left lateral lower leg distal near ankle - ed&C done   Squamous cell carcinoma of skin 12/08/2007   Left lower leg. WD SCC   Squamous cell carcinoma of skin 02/01/2008   Left distal med. pretibial. WD SCC   Squamous cell carcinoma of skin 02/01/2008   Right mid pretibial. WD SCC   Squamous cell carcinoma of skin 02/01/2008   Right mid pretibal inferior. WD SCC with superficial infiltration.   Squamous cell carcinoma of skin 04/05/2008   Left dorsum hand. WD SCC with superficial infiltration.   Squamous cell carcinoma of skin 06/15/2008   Right lower leg inferior. WD SCC   Squamous cell carcinoma of skin 06/15/2008   Right lower leg superior. SCCis hypertrophic   Squamous cell carcinoma of skin 07/05/2008   Right lat. lower leg, Right lower leg, Left lower leg   Squamous cell carcinoma of skin 08/04/2008   Right ant. lower leg, Left med. lower leg   Squamous cell carcinoma of skin 09/01/2008   Right post. lower leg   Squamous cell carcinoma of skin 10/04/2008   Left post leg   Squamous cell carcinoma of skin 12/01/2008   Left lower leg   Squamous cell carcinoma of skin 03/08/2009   Right ant. mid lower leg, Right lower leg   Squamous cell carcinoma of skin 05/17/2009   Left lower post. leg superior   Squamous cell carcinoma of skin 10/11/2009   Left lower leg. KA-pattern   Squamous cell carcinoma of skin 03/28/2010   Right lower leg   Squamous cell carcinoma of skin 03/25/2013   Right lower leg   Squamous cell carcinoma of skin 06/23/2013   Right post. lower leg. Right medial lower leg. Left medial lower leg   Squamous cell carcinoma of skin 09/09/2013   Left anterior upper arm. Right anterior lower leg. Right lat. lower leg. Right below knee lower leg   Squamous cell carcinoma of skin  12/08/2013   Right posterior lower leg sup. Right forearm   Squamous cell carcinoma of skin 03/03/2014   Right posterior leg. Right med. lower leg   Squamous cell carcinoma of skin 06/22/2014   Right to of shoulder anterior. Right top of shoulder posterior   Squamous cell carcinoma of skin 08/03/2014   Right anterior forearm   Squamous cell carcinoma of skin 09/14/2014   Right chest   Squamous cell carcinoma of skin 10/30/2014   Right calf   Squamous cell carcinoma of skin 12/14/2014   Right med. distal pretibial sup. Right med distal pretibial inf. Left proximal bicep sup. Left proximal bicep inf.   Squamous cell carcinoma of skin 03/01/2015  Left lat proximal pretibial near knee   Squamous cell carcinoma of skin 03/29/2015   Left mid to prox. pretibial. Left mid. lat. pretibial.    Squamous cell carcinoma of skin 04/19/2015   Right mid calf. Right distal lat. pretibial   Squamous cell carcinoma of skin 05/31/2015   Right proximal med. pretibial ant. Right proximal med. pretibial posterior   Squamous cell carcinoma of skin 07/16/2015   Right upper arm inf. deltoid   Squamous cell carcinoma of skin 08/06/2015   Left distal pretibial   Squamous cell carcinoma of skin 09/12/2015   Left postauricular area   Squamous cell carcinoma of skin 12/12/2015   Left proximal pretibial x4 sites   Squamous cell carcinoma of skin 12/31/2015   Right med. lower leg above ankle sup. Right med. lower leg above ankle inf. Left inf. lat. calf med. Left inf. calf lat.   Squamous cell carcinoma of skin 01/31/2016   Right med. sup. ankle area. Right med mid proximal calf. Left dorsum foot.    Squamous cell carcinoma of skin 03/17/2016   Left post. distal calf lateral. Left post distal calf med. Left mid lat ant. thigh. Right mid lat. calf   Squamous cell carcinoma of skin 05/21/2016   Right proximal medial pretibial. Right mid med. pretibial.   Squamous cell carcinoma of skin 07/03/2016   Right mid  med. pretibial. Right proximal med. pretibial   Squamous cell carcinoma of skin 08/20/2016   Right medial calf. Left distal pretibial. Right upper back paraspinal   Squamous cell carcinoma of skin 10/30/2016   Right medial calf x2   Squamous cell carcinoma of skin 12/10/2016   left mid back 6.0cm lat to spine. Left sup. lat. calf. Left inf. post. calf   Squamous cell carcinoma of skin 12/25/2016   Right medial above ankle. Right mid pretibial. Right prox. pretibial. Right prox. pretibial med.    Squamous cell carcinoma of skin 01/19/2017   Left forearm. Left ant. neck   Squamous cell carcinoma of skin 03/23/2017   Left med. pretibial below knee   Squamous cell carcinoma of skin 04/13/2017   Right lat. sup. ankle. Right medial ankle   Squamous cell carcinoma of skin 04/27/2017   Left med. distal prox. calf. Left medial distal calf. Left lat. distal prox. calf. Left lat distal distal calf   Squamous cell carcinoma of skin 05/14/2017   Left bicep. Left dorsum mid forearm   Squamous cell carcinoma of skin 06/25/2017   Right med. distal calf x3   Squamous cell carcinoma of skin 09/02/2017   Left med. infraclavicular   Squamous cell carcinoma of skin 12/21/2017   Left prox. med. lower leg. Right forearm. Left forearm. Left bicep   Squamous cell carcinoma of skin 01/07/2018   Left dorsum foot prox. Left dorsum foot distal. Left mid to distal calf. Right chest   Squamous cell carcinoma of skin 03/08/2018   Left bicep. Right proximal dorsum forearm. Right distal med. pretibial sup. Right distal med pretibial inf.    Squamous cell carcinoma of skin 04/05/2018   Right sup. med scapula   Squamous cell carcinoma of skin 06/03/2018   Left med. ankle sup. Left med. ankle inf. Left lateral ankle.   Squamous cell carcinoma of skin 09/01/2018   Right mid lat. calf. Right lat. knee. Prox post calf   Squamous cell carcinoma of skin 11/01/2018   Left lateral ankle   Squamous cell carcinoma of skin  12/08/2018   Right lateral  ankle lat malleolus   Squamous cell carcinoma of skin 01/05/2019   Right lateral elbow. Left mid medial calf. Left prox. ant. thigh. Right distal lat tricep   Squamous cell carcinoma of skin 01/19/2019   Right popliteal   Squamous cell carcinoma of skin 02/03/2019   Left mid lat calf. Left mid calf. Left dorsum lat distal foot. Left dorsum distal foot   Squamous cell carcinoma of skin 03/30/2019   Right lower leg above med ankle ant. Right lower leg above ankle sup. Right lower leg above the med ankle inf.   Squamous cell carcinoma of skin 05/12/2019   Right chest medial. Left deltoid   Squamous cell carcinoma of skin 06/23/2019   Right ant thigh distal above knee. Left med distal thigh. Left ant thigh above knee   Squamous cell carcinoma of skin 07/20/2019   Right chest. Right dorsal hand.   Squamous cell carcinoma of skin 08/03/2019   Left distal lat pretibial. Left distal med calf. Right prox. lat calf   Squamous cell carcinoma of skin 09/05/2019   Right middle bicep. Right sup lat bicep. Right sup mid bicep. Right sup med bicep   Squamous cell carcinoma of skin 09/15/2019   Right proximal med pretibial. Left distal lat thigh   Squamous cell carcinoma of skin 04/02/2020   Right calf, bleow right medial knee sup, below right medial knee post, below right medial knee inf   Squamous cell carcinoma of skin 05/14/2020   Right ant. shoulder. Mod. Diff. SCC. EDC   Squamous cell carcinoma of skin 06/27/2020   R mid to distal pretibial - ED&C   Squamous cell carcinoma of skin 06/27/2020   Below the R knee - ED&C    Squamous cell carcinoma of skin 06/27/2020   R prox pretibial    Squamous cell carcinoma of skin 06/27/2020   L distal med calf   Squamous cell carcinoma of skin 08/06/2020   Left ant deltoid   Squamous cell carcinoma of skin 12/25/2020   Left lat ankle EDC   Squamous cell carcinoma of skin 03/27/2021   left knee, EDC   Squamous cell carcinoma  of skin 03/27/2021   left lateral leg, EDC   Squamous cell carcinoma of skin 07/03/2021   Left lat neck - EDC   Squamous cell carcinoma of skin 07/03/2021   Right ant thigh - EDC   Squamous cell carcinoma of skin 07/03/2021   Right prox lat pretibial - EDC   Squamous cell carcinoma of skin 10/16/2021   right med mid calf, EDC   Squamous cell carcinoma of skin 10/16/2021   left medial prox pretibial, EDC   Squamous cell carcinoma of skin 10/16/2021   left distal pretibial, EDC   Squamous cell carcinoma of skin 03/26/2023   Left ant lat ankle EDC   Squamous cell carcinoma of skin 03/26/2023   Left medial lower leg above ankle EDC EDC   Squamous cell carcinoma of skin 03/26/2023   Right lat ankle sup EDC   Squamous cell carcinoma of skin 03/26/2023   Right lat ankle middle EDC   Squamous cell carcinoma of skin 03/26/2023   Right lat ankle inf EDC   Squamous cell carcinoma of skin 10/15/2023   left distal pretibial, EDC   Squamous cell carcinoma of skin 10/15/2023   left proximal pretibial, EDC   Squamous cell carcinoma of skin 10/15/2023   right middle medial lower leg, EDC   Past Surgical History:  Procedure Laterality Date  BREAST EXCISIONAL BIOPSY Left 1972   neg   BREAST EXCISIONAL BIOPSY Right 1980   neg   COLONOSCOPY     ESOPHAGOGASTRODUODENOSCOPY (EGD) WITH PROPOFOL  N/A 04/09/2015   Procedure: ESOPHAGOGASTRODUODENOSCOPY (EGD) WITH PROPOFOL ;  Surgeon: Lamar ONEIDA Holmes, MD;  Location[facility] ENDOSCOPY;  Service: Endoscopy;  Laterality: N/A;   skin cancer removal     Social History:  reports that she has never smoked. She has never used smokeless tobacco. She reports that she does not drink alcohol and does not use drugs.  Allergies  Allergen Reactions   Neomycin-Bacitracin Zn-Polymyx     Family History  Problem Relation Age of Onset   Breast cancer Sister 6   Atrial fibrillation Mother     Prior to Admission medications   Medication Sig Start Date End Date  Taking? Authorizing Provider  alendronate  (FOSAMAX ) 70 MG tablet TAKE 1 TABLET EVERY 7 DAYS WITH A FULL GLASS OF WATER ON AN EMPTY STOMACH DO NOT LIE DOWN FOR AT LEAST 30 MIN 05/06/23   Abernathy, Alyssa, NP  amLODipine  (NORVASC ) 5 MG tablet TAKE 1 TABLET BY MOUTH ONCE DAILY 04/27/23   Abernathy, Mardy, NP  Apoaequorin (PREVAGEN) 10 MG CAPS Take 10 mg by mouth daily.    [provider]  bimatoprost (LUMIGAN) 0.01 % SOLN Place 1 drop into the right eye at bedtime.    [provider]  fluorouracil  (EFUDEX ) 5 % cream Apply twice daily to affected areas on left foot up to 2 weeks 12/30/23   Hester Alm BROCKS, MD  hydrOXYzine  (ATARAX ) 10 MG tablet TAKE ONE TABLET ONCE OR TWICE A DAY IF NEEDED FOR [facility] 12/28/23   Hester Alm BROCKS, MD  losartan  (COZAAR ) 25 MG tablet Take 25 mg by mouth daily.    [provider]  metoprolol  succinate (TOPROL -XL) 25 MG 24 hr tablet Take 25 mg by mouth daily.    [provider]  mupirocin  ointment (BACTROBAN ) 2 % Apply 1 Application topically daily. Qd to wounds for a 14 day supply 10/28/23   Hester Alm BROCKS, MD  simvastatin  (ZOCOR ) 20 MG tablet Take 1 tablet (20 mg total) by mouth at bedtime. 05/06/23   Liana Mardy, NP    Physical Exam: Vitals:   03/14/24 1545 03/14/24 1600 03/14/24 1630 03/14/24 1635  BP:  118/82 (!) 121/93   Pulse: 76  (!) 45 (!) 130  Resp: 14 (!) 22 (!) 28 (!) 27  Temp:      TempSrc:      SpO2: 100%  100% 100%  Weight:      Height:       Constitutional:  Normal appearance. Non toxic-appearing. thin HENT: Head Normocephalic and atraumatic.  Mucous membranes are moist.  Eyes:  Extraocular intact. Conjunctivae normal. Pupils are equal, round, and reactive to light.  Cardiovascular: rapid irregular rhythm  Pulmonary: Non labored, symmetric rise of chest wall.  Musculoskeletal:  Normal range of motion.  Skin: warm and dry. not jaundiced.  Neurological: No focal deficit present. alert.  Oriented. Psychiatric: Mood and Affect congruent.   Data Reviewed:     Latest Ref Rng & Units 03/14/2024   12:30 PM 05/07/2023    9:47 AM 04/30/2022    1:12 PM  CBC  WBC 4.0 - 10.5 K/uL 8.8  6.5  8.0   Hemoglobin 12.0 - 15.0 g/dL 87.9  87.7  87.6   Hematocrit 36.0 - 46.0 % 39.0  39.4  37.1   Platelets 150 - 400 K/uL 263  222  229       Latest Ref Rng & Units 03/14/2024   12:30 PM 02/02/2024   10:59 AM 05/07/2023    9:47 AM  BMP  Glucose 70 - 99 mg/dL 875  898  884   BUN 8 - 23 mg/dL 24  12  21    Creatinine 0.44 - 1.00 mg/dL 8.63  8.90  8.73   BUN/Creat Ratio 12 - 28  11  17    Sodium 135 - 145 mmol/L 140  143  140   Potassium 3.5 - 5.1 mmol/L 4.2  4.2  4.4   Chloride 98 - 111 mmol/L 103  104  100   CO2 22 - 32 mmol/L 21  22  27    Calcium 8.9 - 10.3 mg/dL 9.0  9.4  9.8    CT reviewed  Assessment and Plan: Atrial fibrillation, RVR - New diagnosis for this patient - Echocardiogram ordered and pending - Initiate amiodarone  drip -- Start Eliquis  (<60k but Cr and Age support 5mg  dose), counseled pt on risks and benefits AC. She has agreed to proceed  - Cardiology consulted  Hyperlipidemia - Continue statin  Heart failure with reduced EF, acute exacerbation - Echocardiogram May 2025: EF 25%, mild AR, moderate MR, mild PR, moderate TR.  Mild LVH. - Nuclear stress February 2025: Hypokinesis of global wall, small perfusion abnormality of mild intensity in anterior region.  No clear evidence of ischemia.  Given absence of anginal symptoms plan was to proceed with medical management. - Patient was previously on Jardiance which was discontinued due to weight loss - Continue with home meds, GDMT as BP tolerates - Low-salt diet - Strict I's and O's - High risk volume overload given A-fib RVR at this time - Pleural effusion seen on CT.  Proceed with IV Lasix  for now.  Lung nodule - Incidentally seen on CT.  4 mm nodule in right upper lung no follow-up needed if low risk patient.  PCP  follow-up.   Advance Care Planning: Patient reports desired to be DNR/DNI and says she has advanced directive that confirms this.   Consults: Cardiology  Family Communication: Friend present at bedside  Severity of Illness: The appropriate patient status for this patient is OBSERVATION. Observation status is judged to be reasonable and necessary in order to provide the required intensity of service to ensure the patient's safety. The patient's presenting symptoms, physical exam findings, and initial radiographic and laboratory data in the context of their medical condition is felt to place them at decreased risk for further clinical deterioration. Furthermore, it is anticipated that the patient will be medically stable for discharge from the hospital within 2 midnights of admission.   Author: Lurdes Haltiwanger, DO 03/14/2024 5:05 PM  For on call review [email or web address].

## 2024-03-14 NOTE — ED Notes (Signed)
 Pt states she feels fine at this time- denies CP dizziness SHOB nausea, but reports lightheadedness and weakness with exertion such as walking up the stairs and palpitations last night. Pt denies blood thinners or history of a-fibb. Pt is CAOX4.

## 2024-03-14 NOTE — ED Provider Notes (Signed)
 [facility] Provider Note    Event Date/Time   First MD Initiated Contact with Patient 03/14/24 1228     (approximate)   History   Tachycardia   HPI  Joann Warren is a 78 y.o. female with a past medical history of CHF, hypertension hyperlipidemia who presents with new onset atrial fibrillation.  Patient reports palpitations starting 3 days ago.  There is shortness of breath but denies any chest pain abdominal pain changes in urinary bowel habits or fevers.  She does have some lightheadedness with exertion however none while laying still.  She has no prior history of atrial fibrillation and is not on any anticoagulation.  She was seen in cardiology office today with sent to emergency department for further evaluation      Physical Exam   Triage Vital Signs: ED Triage Vitals [03/14/24 1223]  Encounter Vitals Group     BP (!) 140/98     Girls Systolic BP Percentile      Girls Diastolic BP Percentile      Boys Systolic BP Percentile      Boys Diastolic BP Percentile      Pulse Rate 66     Resp 17     Temp 98.1 F (36.7 C)     Temp Source Oral     SpO2 100 %     Weight 106 lb (48.1 kg)     Height 5' 2 (1.575 m)     Head Circumference      Peak Flow      Pain Score 0     Pain Loc      Pain Education      Exclude from Growth Chart     Most recent vital signs: Vitals:   03/14/24 1530 03/14/24 1545  BP: 130/70   Pulse:  76  Resp:  14  Temp:    SpO2:  100%    Nursing Triage Note reviewed. Vital signs reviewed and patients oxygen saturation is normoxic  General: Patient is well nourished, well developed, awake and alert,  Head: Normocephalic and atraumatic Eyes: Normal inspection, extraocular muscles intact, no conjunctival pallor Ear, nose, throat: Normal external exam Neck: Normal range of motion Respiratory: Patient is in no respiratory distress, lungs CTAB Cardiovascular: Patient is tachycardic, irregular irregular  GI: Abd SNT  with no guarding or rebound  Back: Normal inspection of the back with good strength and range of motion throughout all ext Extremities: pulses intact with good cap refills, no LE pitting edema or calf tenderness Neuro: The patient is alert and oriented to person, place, and time, appropriately conversive, with 5/5 bilat UE/LE strength, no gross motor or sensory defects noted. Coordination appears to be adequate. Skin: Warm, dry, and intact Psych: normal mood and affect, no SI or HI  ED Results / Procedures / Treatments   Labs (all labs ordered are listed, but only abnormal results are displayed) Labs Reviewed  BASIC METABOLIC PANEL WITH GFR - Abnormal; Notable for the following components:      Result Value   CO2 21 (*)    Glucose, Bld 124 (*)    BUN 24 (*)    Creatinine, Ser 1.36 (*)    GFR, Estimated 40 (*)    Anion gap 16 (*)    All other components within normal limits  HEPATIC FUNCTION PANEL - Abnormal; Notable for the following components:   AST 90 (*)    ALT 67 (*)    All other  components within normal limits  T4, FREE - Abnormal; Notable for the following components:   Free T4 1.21 (*)    All other components within normal limits  D-DIMER, QUANTITATIVE - Abnormal; Notable for the following components:   D-Dimer, Quant 1.71 (*)    All other components within normal limits  BRAIN NATRIURETIC PEPTIDE - Abnormal; Notable for the following components:   B Natriuretic Peptide 1,470.6 (*)    All other components within normal limits  TROPONIN I (HIGH SENSITIVITY) - Abnormal; Notable for the following components:   Troponin I (High Sensitivity) 41 (*)    All other components within normal limits  TROPONIN I (HIGH SENSITIVITY) - Abnormal; Notable for the following components:   Troponin I (High Sensitivity) 22 (*)    All other components within normal limits  CBC  LIPASE, BLOOD  TSH  MAGNESIUM     EKG EKG and rhythm strip are interpreted by myself:   EKG: [afib with  rapid ventricular rate at heart rate of 170, normal QRS duration, QTc 430, nonspecific ST segments and T waves no ectopy EKG not consistent with Acute STEMI Rhythm strip: afib with rvr in lead II   RADIOLOGY XR chest: Small pleural effusions and radiologist agrees CTA chest: No large PE on my independent review and interpretation awaiting radiology report    PROCEDURES:  Critical Care performed: No  .Critical Care  Performed by: Nicholaus Rolland BRAVO, MD Authorized by: Nicholaus Rolland BRAVO, MD   Critical care provider statement:    Critical care time (minutes):  35   Critical care was necessary to treat or prevent imminent or life-threatening deterioration of the following conditions:  Circulatory failure   Critical care was time spent personally by me on the following activities:  Development of treatment plan with patient or surrogate, discussions with consultants, evaluation of patient's response to treatment, examination of patient, ordering and review of laboratory studies, ordering and review of radiographic studies, ordering and performing treatments and interventions, pulse oximetry, re-evaluation of patient's condition and review of old charts   Care discussed with: admitting provider   Comments:     Management of atrial fibrillation with rapid ventricular rate requiring bolus dose of diltiazem  reassessment and now diltiazem  drip    MEDICATIONS ORDERED IN ED: Medications  diltiazem  (CARDIZEM ) 125 mg in dextrose  5% 125 mL (1 mg/mL) infusion (has no administration in time range)  sodium chloride  0.9 % bolus 500 mL (500 mLs Intravenous New Bag/Given 03/14/24 1307)  diltiazem  (CARDIZEM ) injection 10 mg (10 mg Intravenous Given 03/14/24 1349)  iohexol  (OMNIPAQUE ) 350 MG/ML injection 60 mL (60 mLs Intravenous Contrast Given 03/14/24 1528)     IMPRESSION / MDM / ASSESSMENT AND PLAN / ED COURSE                                Differential diagnosis includes, but is not limited to  arrhythmia, PE, CHF, electrolyte derangement, thyroid  abnormality, CHF   ED course: Patient presents with atrial fibrillation with rapid ventricular rate but she is not hypotensive.  Chest x-ray consistent with mild pleural edema.  She does have an elevated troponin and an elevated BNP but she denies any chest pain and I suspect this is reactive to A-fib.  Thyroid  markers unremarkable.  She was given a small dose of IV fluids and IV diltiazem  which did rate control the patient patient is pending the CT PE report per radiology but she  will require admission today   Clinical Course as of 03/14/24 1636  Mon Mar 14, 2024  1430 D-Dimer, Quant(!): 1.71 Will obtain a CT PE [HD]  1431 Troponin I (High Sensitivity)(!): 41 [HD]  1431 B Natriuretic Peptide(!): 1,470.6 Consistent with being in atrial fibrillation for prolonged period [HD]  1431 Creatinine(!): 1.36 Slight acute renal insufficiency [HD]  1622 CT Angio Chest PE W and/or Wo Contrast No PE on CT PE [HD]  1634 On reassessment patient now back in A-fib with RVR.  I counseled the patient on results and voiced understanding and all in agreement with admission today.  Will start the patient on a diltiazem  drip and patient called into hospitalist for admission [HD]    Clinical Course User Index [HD] Nicholaus Rolland BRAVO, MD   Data(2/3 categories following were performed): I reviewed or ordered at least three unique tests, external notes, and/or the history required an independent historian as one of the three requirements as following: At least 3 labs/imaging studies were obtained and/or reviewed. AND  I discussed the management of the patient with the following external physician or qualified healthcare provider: Admitting physician  Risk: This patient has a high risk of morbidity due to further diagnostic testing or treatment. Rationale: Decision made regarding admission  Admit Level 5 - Labs/Rads, Admit, Consult:  Suggested E/M Coding  Level: 5, 99285  This level has been selected based on the 04/02/22 CPT guidelines for E/M codes in the Emergency Department based on 2/3 of the CoPA, Data, and Risk.   FINAL CLINICAL IMPRESSION(S) / ED DIAGNOSES   Final diagnoses:  Atrial fibrillation with rapid ventricular response (HCC)  Acute on chronic congestive heart failure, unspecified heart failure type (HCC)     Rx / DC Orders   ED Discharge Orders     None        Note:  This document was prepared using Dragon voice recognition software and may include unintentional dictation errors.   Nicholaus Rolland BRAVO, MD 03/14/24 [phone]

## 2024-03-15 ENCOUNTER — Observation Stay
Admit: 2024-03-15 | Discharge: 2024-03-15 | Disposition: A | Discharge status: Home / Self Care | Attending: Family Medicine | Admitting: Family Medicine

## 2024-03-15 ENCOUNTER — Other Ambulatory Visit: Payer: Self-pay

## 2024-03-15 ENCOUNTER — Observation Stay: Admitting: Anesthesiology

## 2024-03-15 ENCOUNTER — Telehealth (HOSPITAL_COMMUNITY): Payer: Self-pay | Admitting: Pharmacy Technician

## 2024-03-15 ENCOUNTER — Observation Stay: Admit: 2024-03-15 | Discharge: 2024-03-15 | Disposition: A | Discharge status: Home / Self Care

## 2024-03-15 ENCOUNTER — Other Ambulatory Visit (HOSPITAL_COMMUNITY): Payer: Self-pay

## 2024-03-15 ENCOUNTER — Encounter
Admission: EM | Disposition: A | Discharge status: Home / Self Care | Payer: Self-pay | Source: Ambulatory Visit | Attending: Family Medicine

## 2024-03-15 DIAGNOSIS — I4819 Other persistent atrial fibrillation: Secondary | ICD-10-CM | POA: Diagnosis not present

## 2024-03-15 DIAGNOSIS — I4891 Unspecified atrial fibrillation: Secondary | ICD-10-CM | POA: Diagnosis not present

## 2024-03-15 DIAGNOSIS — N1832 Chronic kidney disease, stage 3b: Secondary | ICD-10-CM | POA: Diagnosis not present

## 2024-03-15 DIAGNOSIS — Z9889 Other specified postprocedural states: Secondary | ICD-10-CM | POA: Diagnosis not present

## 2024-03-15 DIAGNOSIS — M810 Age-related osteoporosis without current pathological fracture: Secondary | ICD-10-CM | POA: Diagnosis not present

## 2024-03-15 DIAGNOSIS — H409 Unspecified glaucoma: Secondary | ICD-10-CM | POA: Diagnosis not present

## 2024-03-15 DIAGNOSIS — I2489 Other forms of acute ischemic heart disease: Secondary | ICD-10-CM | POA: Diagnosis not present

## 2024-03-15 DIAGNOSIS — Z803 Family history of malignant neoplasm of breast: Secondary | ICD-10-CM | POA: Diagnosis not present

## 2024-03-15 DIAGNOSIS — R911 Solitary pulmonary nodule: Secondary | ICD-10-CM | POA: Diagnosis not present

## 2024-03-15 DIAGNOSIS — I5023 Acute on chronic systolic (congestive) heart failure: Secondary | ICD-10-CM | POA: Diagnosis not present

## 2024-03-15 DIAGNOSIS — E785 Hyperlipidemia, unspecified: Secondary | ICD-10-CM | POA: Diagnosis not present

## 2024-03-15 DIAGNOSIS — Z8249 Family history of ischemic heart disease and other diseases of the circulatory system: Secondary | ICD-10-CM | POA: Diagnosis not present

## 2024-03-15 DIAGNOSIS — Z79899 Other long term (current) drug therapy: Secondary | ICD-10-CM | POA: Diagnosis not present

## 2024-03-15 DIAGNOSIS — Z85828 Personal history of other malignant neoplasm of skin: Secondary | ICD-10-CM | POA: Diagnosis not present

## 2024-03-15 DIAGNOSIS — I130 Hypertensive heart and chronic kidney disease with heart failure and stage 1 through stage 4 chronic kidney disease, or unspecified chronic kidney disease: Secondary | ICD-10-CM | POA: Diagnosis not present

## 2024-03-15 DIAGNOSIS — Z7983 Long term (current) use of bisphosphonates: Secondary | ICD-10-CM | POA: Diagnosis not present

## 2024-03-15 DIAGNOSIS — Z883 Allergy status to other anti-infective agents status: Secondary | ICD-10-CM | POA: Diagnosis not present

## 2024-03-15 DIAGNOSIS — Z66 Do not resuscitate: Secondary | ICD-10-CM | POA: Diagnosis not present

## 2024-03-15 DIAGNOSIS — Z7901 Long term (current) use of anticoagulants: Secondary | ICD-10-CM | POA: Diagnosis not present

## 2024-03-15 HISTORY — PX: TEE WITHOUT CARDIOVERSION: SHX5443

## 2024-03-15 HISTORY — PX: CARDIOVERSION: SHX1299

## 2024-03-15 LAB — BASIC METABOLIC PANEL WITH GFR
Anion gap: 13 (ref 5–15)
Anion gap: 14 (ref 5–15)
BUN: 26 mg/dL — ABNORMAL HIGH (ref 8–23)
BUN: 25 mg/dL — ABNORMAL HIGH (ref 8–23)
CO2: 21 mmol/L — ABNORMAL LOW (ref 22–32)
CO2: 23 mmol/L (ref 22–32)
Calcium: 8.4 mg/dL — ABNORMAL LOW (ref 8.9–10.3)
Calcium: 8.6 mg/dL — ABNORMAL LOW (ref 8.9–10.3)
Chloride: 104 mmol/L (ref 98–111)
Chloride: 101 mmol/L (ref 98–111)
Creatinine, Ser: 1.37 mg/dL — ABNORMAL HIGH (ref 0.44–1.00)
Creatinine, Ser: 1.38 mg/dL — ABNORMAL HIGH (ref 0.44–1.00)
GFR, Estimated: 40 mL/min — ABNORMAL LOW (ref >60)
GFR, Estimated: 39 mL/min — ABNORMAL LOW (ref >60)
Glucose, Bld: 123 mg/dL — ABNORMAL HIGH (ref 70–99)
Glucose, Bld: 106 mg/dL — ABNORMAL HIGH (ref 70–99)
Potassium: 4 mmol/L (ref 3.5–5.1)
Potassium: 4.3 mmol/L (ref 3.5–5.1)
Sodium: 138 mmol/L (ref 135–145)
Sodium: 138 mmol/L (ref 135–145)

## 2024-03-15 LAB — ECHOCARDIOGRAM COMPLETE
AR max vel: 1.62 cm2
AV Area VTI: 1.7 cm2
AV Area mean vel: 1.43 cm2
AV Mean grad: 2 mmHg
AV Peak grad: 3.4 mmHg
Ao pk vel: 0.92 m/s
Area-P 1/2: 6.12 cm2
Height: 62 in
MV M vel: 3.53 m/s
MV Peak grad: 49.8 mmHg
Radius: 0.4 cm
S' Lateral: 4 cm
Weight: 1696 [oz_av]

## 2024-03-15 LAB — LACTIC ACID, PLASMA
Lactic Acid, Venous: 1.4 mmol/L (ref 0.5–1.9)
Lactic Acid, Venous: 1.3 mmol/L (ref 0.5–1.9)

## 2024-03-15 LAB — ECHO TEE

## 2024-03-15 SURGERY — ECHOCARDIOGRAM, TRANSESOPHAGEAL
Anesthesia: General

## 2024-03-15 MED ORDER — FUROSEMIDE 10 MG/ML IJ SOLN
40.0000 mg | Freq: Two times a day (BID) | INTRAMUSCULAR | Status: AC
  Administered 2024-03-15 – 2024-03-16 (×2): 40 mg via INTRAVENOUS
  Filled 2024-03-15 (×2): qty 4

## 2024-03-15 MED ORDER — DILTIAZEM HCL 25 MG/5ML IV SOLN
5.0000 mg | Freq: Once | INTRAVENOUS | Status: AC
  Administered 2024-03-15: 5 mg via INTRAVENOUS
  Filled 2024-03-15: qty 5

## 2024-03-15 MED ORDER — PHENYLEPHRINE HCL-NACL 20-0.9 MG/250ML-% IV SOLN
INTRAVENOUS | Status: AC
Start: 2024-03-15 — End: 2024-03-15
  Filled 2024-03-15: qty 250

## 2024-03-15 MED ORDER — BUTAMBEN-TETRACAINE-BENZOCAINE 2-2-14 % EX AERO
INHALATION_SPRAY | CUTANEOUS | Status: AC
  Filled 2024-03-15: qty 5

## 2024-03-15 MED ORDER — HYDROXYZINE HCL 10 MG PO TABS
10.0000 mg | ORAL_TABLET | Freq: Every evening | ORAL | Status: DC | PRN
  Administered 2024-03-15: 10 mg via ORAL
  Filled 2024-03-15 (×3): qty 1

## 2024-03-15 MED ORDER — SODIUM CHLORIDE 0.9 % IV SOLN: INTRAVENOUS | Status: DC

## 2024-03-15 MED ORDER — PHENYLEPHRINE 80 MCG/ML (10ML) SYRINGE FOR IV PUSH (FOR BLOOD PRESSURE SUPPORT)
PREFILLED_SYRINGE | INTRAVENOUS | Status: DC | PRN
  Administered 2024-03-15: 160 ug via INTRAVENOUS
  Administered 2024-03-15: 80 ug via INTRAVENOUS

## 2024-03-15 MED ORDER — EPHEDRINE SULFATE-NACL 50-0.9 MG/10ML-% IV SOSY
PREFILLED_SYRINGE | INTRAVENOUS | Status: DC | PRN
  Administered 2024-03-15: 5 mg via INTRAVENOUS

## 2024-03-15 MED ORDER — LIDOCAINE VISCOUS HCL 2 % MT SOLN
OROMUCOSAL | Status: AC
  Filled 2024-03-15: qty 15

## 2024-03-15 MED ORDER — PROPOFOL 1000 MG/100ML IV EMUL
INTRAVENOUS | Status: AC
  Filled 2024-03-15: qty 100

## 2024-03-15 MED ORDER — AMIODARONE HCL 200 MG PO TABS
400.0000 mg | ORAL_TABLET | Freq: Two times a day (BID) | ORAL | Status: DC
  Administered 2024-03-15 – 2024-03-16 (×3): 400 mg via ORAL
  Filled 2024-03-15 (×4): qty 2

## 2024-03-15 MED ORDER — LOSARTAN POTASSIUM 25 MG PO TABS
25.0000 mg | ORAL_TABLET | Freq: Every day | ORAL | Status: DC
  Administered 2024-03-15: 25 mg via ORAL
  Filled 2024-03-15: qty 1

## 2024-03-15 MED ORDER — PROPOFOL 10 MG/ML IV BOLUS
INTRAVENOUS | Status: DC | PRN
  Administered 2024-03-15 (×3): 20 mg via INTRAVENOUS

## 2024-03-15 MED ORDER — AMIODARONE HCL 200 MG PO TABS
200.0000 mg | ORAL_TABLET | Freq: Every day | ORAL | Status: DC
  Filled 2024-03-15: qty 1

## 2024-03-15 NOTE — Progress Notes (Signed)
*  PRELIMINARY RESULTS* Echocardiogram 2D Echocardiogram has been performed.  Floydene Harder 03/15/2024, 8:01 AM

## 2024-03-15 NOTE — ED Notes (Addendum)
 Pt noted to be sustaining HR of 130-160 on cardiac monitor. EKG performed and Mansy, MD notified. See MAR. Pt denies CP or sob.

## 2024-03-15 NOTE — Anesthesia Preprocedure Evaluation (Addendum)
 Anesthesia Evaluation  Patient identified by MRN, date of birth, ID band Patient awake    Reviewed: Allergy & Precautions, NPO status , Patient's Chart, lab work & pertinent test results  History of Anesthesia Complications Negative for: history of anesthetic complications  Airway Mallampati: I   Neck ROM: Full    Dental no notable dental hx.    Pulmonary neg pulmonary ROS   Pulmonary exam normal breath sounds clear to auscultation       Cardiovascular hypertension, +CHF  + dysrhythmias (a fib with RVR)  Rhythm:Irregular Rate:Tachycardia  Echo 03/14/24:   1. Left ventricular ejection fraction, by estimation, is 20 to 25%. The left ventricle has severely decreased function. The left ventricle demonstrates global hypokinesis. There is mild left ventricular hypertrophy. Left ventricular diastolic parameters  are indeterminate.   2. Right ventricular systolic function was not well visualized. The right ventricular size is not well visualized. There is mildly elevated pulmonary artery systolic pressure. The estimated right ventricular systolic pressure is 42.1 mmHg.   3. Left atrial size was moderately dilated.   4. Right atrial size was moderately dilated.   5. Moderate pleural effusion.   6. The mitral valve is normal in structure. Mild to moderate mitral valve regurgitation.   7. Tricuspid valve regurgitation is moderate.   8. The aortic valve is tricuspid. There is mild thickening of the aortic valve. Aortic valve regurgitation is trivial.   9. The inferior vena cava is normal in size with <50% respiratory variability, suggesting right atrial pressure of 8 mmHg.    Myocardial perfusion 10/01/23:   Borderline small area of anterior defect with borderline redistribution this is a relatively small area.  There is no clear evidence of ischemia but there is a persistent borderline defect ejection fraction is also borderline mildly low at  48%.  This is a moderate risk scan with mild reduced left ventricular function and borderline small anterior defect consider further evaluation or alternate imaging if clinically indicated    Neuro/Psych negative neurological ROS     GI/Hepatic negative GI ROS,,,  Endo/Other  negative endocrine ROS    Renal/GU Renal disease (CKD)     Musculoskeletal   Abdominal   Peds  Hematology  (+) Blood dyscrasia, anemia   Anesthesia Other Findings   Reproductive/Obstetrics                              Anesthesia Physical Anesthesia Plan  ASA: 3  Anesthesia Plan: General   Post-op Pain Management:    Induction: Intravenous  PONV Risk Score and Plan: 3 and Propofol  infusion, TIVA and Treatment may vary due to age or medical condition  Airway Management Planned: Natural Airway  Additional Equipment:   Intra-op Plan:   Post-operative Plan:   Informed Consent: I have reviewed the patients History and Physical, chart, labs and discussed the procedure including the risks, benefits and alternatives for the proposed anesthesia with the patient or authorized representative who has indicated his/her understanding and acceptance.   Patient has DNR.  Discussed DNR with patient and Suspend DNR.     Plan Discussed with: CRNA  Anesthesia Plan Comments: (LMA/GETA backup discussed.  Patient consented for risks of anesthesia including but not limited to:  - adverse reactions to medications - damage to eyes, teeth, lips or other oral mucosa - nerve damage due to positioning  - sore throat or hoarseness - damage to heart, brain, nerves, lungs, other parts  of body or loss of life  Informed patient about role of CRNA in peri- and intra-operative care.  Patient voiced understanding.)         Anesthesia Quick Evaluation

## 2024-03-15 NOTE — Progress Notes (Signed)
 PROGRESS NOTE    Joann Warren  FMW:[id number] DOB: 23-Jan-1946 DOA: 03/14/2024 PCP: Liana Fish, NP  Chief Complaint  Patient presents with   Tachycardia    Hospital Course:  Joann Warren is a 78 y.o. female with medical history significant of heart failure with reduced EF, hyperlipidemia, hypertension, CKD 3B, who presented to cardiology clinic today complaining of dyspnea on exertion, dizziness. She does report recent diarrheal illness which has since resoled. In cardiology office she was found to be in atrial fibrillation RVR, heart rate 145, T wave inversion seen in V5 and V6.  She was sent to the ER for further evaluation. On arrival to the ED patient received diltiazem  which initially improved her heart rate however RVR recurred. Labs in ED revealed creatinine of 1.36, mildly elevated BUN of 24, AST 90, ALT 67.  BNP 1470.  Initial troponin 41 with repeat 22.  No abnormalities on CBC.  TSH within normal limits.  CTA with cardiac enlargement, bilateral pleural effusions with bilateral perihilar and basilar infiltration.  Noted 4 mm nodule in right lung. Patient was started on diltiazem  with minimal improvement.  She was then started on amiodarone  with minimal improvement.  She underwent DCCV on 7/29 which was successful.  Cardiology has initiated amiodarone  p.o. and IV would like for her to remain in house overnight.  Subjective: Patient seen prior to DCCV today.  She remains hopeful to discharge home after the procedure.   Objective: Vitals:   03/15/24 1315 03/15/24 1330 03/15/24 1358 03/15/24 1503  BP: 116/73 117/76 122/73 124/89  Pulse: 90 92 100 99  Resp: 20 19    Temp:   98.3 F (36.8 C) 98.3 F (36.8 C)  TempSrc:    Oral  SpO2: 99% 98% 99% 100%  Weight:      Height:        Intake/Output Summary (Last 24 hours) at 03/15/2024 1601 Last data filed at 03/15/2024 1544 Gross per 24 hour  Intake 353.6 ml  Output 250 ml  Net 103.6 ml   Filed Weights   03/14/24  1223  Weight: 48.1 kg    Examination: General exam: Appears calm and comfortable, NAD  Respiratory system: No work of breathing, symmetric chest wall expansion Cardiovascular system: Rapid irregular rhythm Gastrointestinal system: Abdomen is nondistended, soft and nontender.  Neuro: Alert and oriented. No focal neurological deficits. Extremities: Symmetric, expected ROM Psychiatry: Demonstrates appropriate judgement and insight. Mood & affect appropriate for situation.   Assessment & Plan:  Principal Problem:   Atrial fibrillation, rapid (HCC) Active Problems:   Atrial fibrillation with RVR (HCC)   Atrial fibrillation, RVR - New diagnosis for this patient - Failed amiodarone  and diltiazem  - Underwent cardioversion today with cardiology - Back on amiodarone  and amiodarone  drip, dosing per cardiology. -- Start Eliquis  (<60k but Cr and Age support 5mg  dose), counseled pt on risks and benefits AC. She has agreed to proceed  - Cardiology consulted   Hyperlipidemia - Continue statin   Heart failure with reduced EF, acute exacerbation - Continued worsening. - Echo this admission: EF 20%, global hypokinesis, mild MR, moderate TR, elevated PASP. - Nuclear stress February 2025: Hypokinesis of global wall, small perfusion abnormality of mild intensity in anterior region.  No clear evidence of ischemia.  Given absence of anginal symptoms plan was to proceed with medical management. - Patient was previously on Jardiance which was discontinued due to weight loss - Continue with home meds, GDMT as BP tolerates - Low-salt diet - Strict  I's and O's - High risk volume overload given A-fib RVR at this time - Pleural effusion seen on CT.  Proceed with IV Lasix  for now.  Otherwise she is clinically euvolemic and on room air.  Can likely discontinue Lasix  soon   Lung nodule - Incidentally seen on CT.  4 mm nodule in right upper lung no follow-up needed if low risk patient.  PCP  follow-up.  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  DVT prophylaxis: Has been initiated on Eliquis .   Code Status: Limited: Do not attempt resuscitation (DNR) -DNR-LIMITED -Do Not Intubate/DNI  Disposition: Discharge home when cleared by cardiology.  Likely tomorrow.  Anticipate no home health needs  Consultants:    Procedures:  DCCV today.  Antimicrobials:  Anti-infectives (From admission, onward)    None       Data Reviewed: I have personally reviewed following labs and imaging studies CBC: Recent Labs  Lab 03/14/24 1230  WBC 8.8  HGB 12.0  HCT 39.0  MCV 92.2  PLT 263   Basic Metabolic Panel: Recent Labs  Lab 03/14/24 1230 03/14/24 1339 03/15/24 1040 03/15/24 1427  NA 140  --  138 138  K 4.2  --  4.0 4.3  CL 103  --  104 101  CO2 21*  --  21* 23  GLUCOSE 124*  --  123* 106*  BUN 24*  --  26* 25*  CREATININE 1.36*  --  1.37* 1.38*  CALCIUM 9.0  --  8.4* 8.6*  MG  --  1.9  --   --    GFR: Estimated Creatinine Clearance: 25.5 mL/min (A) (by C-G formula based on SCr of 1.38 mg/dL (H)). Liver Function Tests: Recent Labs  Lab 03/14/24 1339  AST 90*  ALT 67*  ALKPHOS 93  BILITOT 0.8  PROT 6.5  ALBUMIN 3.5   CBG: No results for input(s): GLUCAP in the last 168 hours.  No results found for this or any previous visit (from the past 240 hours).   Radiology Studies: ECHOCARDIOGRAM COMPLETE Result Date: 03/15/2024    ECHOCARDIOGRAM REPORT   Patient Name:   Joann Warren Date of Exam: 03/15/2024 Medical Rec #:  [id number]        Height:       62.0 in Accession #:    [id number]       Weight:       106.0 lb Date of Birth:  02-04-1946        BSA:          1.460 m Patient Age:    78 years         BP:           127/71 mmHg Patient Gender: F                HR:           117 bpm. Exam Location:  [facility] Procedure: 2D Echo, Cardiac Doppler and Color Doppler (Both Spectral and Color            Flow Doppler were utilized  during procedure). STAT ECHO Indications:     Atrial Fibrillation I48.91  History:         Patient has no prior history of Echocardiogram examinations.                  Risk Factors:Hypertension and Dyslipidemia.  Sonographer:     Christopher Furnace Referring Phys:  [id number] LORANE POLAND Diagnosing Phys:  Keller Alluri  Sonographer Comments: Suboptimal apical window. Image acquisition challenging due to patient body habitus. IMPRESSIONS  1. Left ventricular ejection fraction, by estimation, is 20 to 25%. The left ventricle has severely decreased function. The left ventricle demonstrates global hypokinesis. There is mild left ventricular hypertrophy. Left ventricular diastolic parameters  are indeterminate.  2. Right ventricular systolic function was not well visualized. The right ventricular size is not well visualized. There is mildly elevated pulmonary artery systolic pressure. The estimated right ventricular systolic pressure is 42.1 mmHg.  3. Left atrial size was moderately dilated.  4. Right atrial size was moderately dilated.  5. Moderate pleural effusion.  6. The mitral valve is normal in structure. Mild to moderate mitral valve regurgitation.  7. Tricuspid valve regurgitation is moderate.  8. The aortic valve is tricuspid. There is mild thickening of the aortic valve. Aortic valve regurgitation is trivial.  9. The inferior vena cava is normal in size with <50% respiratory variability, suggesting right atrial pressure of 8 mmHg. FINDINGS  Left Ventricle: Left ventricular ejection fraction, by estimation, is 20 to 25%. The left ventricle has severely decreased function. The left ventricle demonstrates global hypokinesis. The left ventricular internal cavity size was normal in size. There is mild left ventricular hypertrophy. Left ventricular diastolic parameters are indeterminate. Right Ventricle: The right ventricular size is not well visualized. Right vetricular wall thickness was not well visualized. Right  ventricular systolic function was not well visualized. There is mildly elevated pulmonary artery systolic pressure. The tricuspid regurgitant velocity is 2.92 m/s, and with an assumed right atrial pressure of 8 mmHg, the estimated right ventricular systolic pressure is 42.1 mmHg. Left Atrium: Left atrial size was moderately dilated. Right Atrium: Right atrial size was moderately dilated. Pericardium: There is no evidence of pericardial effusion. Mitral Valve: The mitral valve is normal in structure. Mild to moderate mitral valve regurgitation. Tricuspid Valve: The tricuspid valve is normal in structure. Tricuspid valve regurgitation is moderate. Aortic Valve: The aortic valve is tricuspid. There is mild thickening of the aortic valve. Aortic valve regurgitation is trivial. Aortic valve mean gradient measures 2.0 mmHg. Aortic valve peak gradient measures 3.4 mmHg. Aortic valve area, by VTI measures 1.70 cm. Pulmonic Valve: The pulmonic valve was not well visualized. Pulmonic valve regurgitation is trivial. Aorta: The aortic root is normal in size and structure. Venous: The inferior vena cava is normal in size with less than 50% respiratory variability, suggesting right atrial pressure of 8 mmHg. IAS/Shunts: The atrial septum is grossly normal. Additional Comments: There is a moderate pleural effusion.  LEFT VENTRICLE PLAX 2D LVIDd:         4.30 cm LVIDs:         4.00 cm LV PW:         1.00 cm LV IVS:        1.10 cm LVOT diam:     2.00 cm LV SV:         22 LV SV Index:   15 LVOT Area:     3.14 cm  LEFT ATRIUM             Index        RIGHT ATRIUM           Index LA diam:        4.80 cm 3.29 cm/m   RA Area:     20.70 cm LA Vol (A2C):   55.3 ml 37.89 ml/m  RA Volume:   61.40 ml  42.07  ml/m LA Vol (A4C):   62.9 ml 43.09 ml/m LA Biplane Vol: 60.5 ml 41.45 ml/m  AORTIC VALVE                    PULMONIC VALVE AV Area (Vmax):    1.62 cm     PR End Diast Vel: 8.18 msec AV Area (Vmean):   1.43 cm AV Area (VTI):      1.70 cm AV Vmax:           91.60 cm/s AV Vmean:          65.600 cm/s AV VTI:            0.132 m AV Peak Grad:      3.4 mmHg AV Mean Grad:      2.0 mmHg LVOT Vmax:         47.10 cm/s LVOT Vmean:        29.800 cm/s LVOT VTI:          0.072 m LVOT/AV VTI ratio: 0.54  AORTA Ao Root diam: 3.10 cm MITRAL VALVE                  TRICUSPID VALVE MV Area (PHT): 6.12 cm       TR Peak grad:   34.1 mmHg MV Decel Time: 124 msec       TR Vmax:        292.00 cm/s MR Peak grad:    49.8 mmHg MR Mean grad:    31.0 mmHg    SHUNTS MR Vmax:         353.00 cm/s  Systemic VTI:  0.07 m MR Vmean:        259.0 cm/s   Systemic Diam: 2.00 cm MR PISA:         1.01 cm MR PISA Eff ROA: 11 mm MR PISA Radius:  0.40 cm MV E velocity: 95.40 cm/s Keller Paterson Electronically signed by Keller Paterson Signature Date/Time: 03/15/2024/11:17:07 AM    Final    CT Angio Chest PE W and/or Wo Contrast Result Date: 03/14/2024 CLINICAL DATA:  New onset atrial fibrillation for 3 days. Elevated D-dimer. Lightheadedness and weakness with exertion. Palpitations. EXAM: CT ANGIOGRAPHY CHEST WITH CONTRAST TECHNIQUE: Multidetector CT imaging of the chest was performed using the standard protocol during bolus administration of intravenous contrast. Multiplanar CT image reconstructions and MIPs were obtained to evaluate the vascular anatomy. RADIATION DOSE REDUCTION: This exam was performed according to the departmental dose-optimization program which includes automated exposure control, adjustment of the mA and/or kV according to patient size and/or use of iterative reconstruction technique. CONTRAST:  60mL OMNIPAQUE  IOHEXOL  350 MG/ML SOLN COMPARISON:  Chest radiograph 03/14/2024 FINDINGS: Cardiovascular: Technically adequate study with good opacification of the central and segmental pulmonary arteries. Mild motion artifact. No focal filling defects are demonstrated. No evidence of significant pulmonary embolus. Cardiac enlargement. No pericardial effusions. Normal  caliber thoracic aorta. Calcification in the aorta and coronary arteries. Mediastinum/Nodes: Thyroid  gland is unremarkable. Esophagus is decompressed. No significant lymphadenopathy. Lungs/Pleura: Moderate bilateral pleural effusions with bilateral perihilar and basilar atelectasis or consolidation. This may represent compressive atelectasis, pneumonia, edema, aspiration, or combination. No pneumothorax. 4 mm nodule in the right upper lung, series 5, image 46. Upper Abdomen: No acute abnormality. Musculoskeletal: Slight anterior subluxations at C6-7 and C7-T1 levels. In the absence of known trauma, this is likely degenerative or chronic. Otherwise normal alignment of the thoracic spine. Degenerative changes in the spine. No vertebral compression deformities. Review  of the MIP images confirms the above findings. IMPRESSION: 1. No evidence of significant pulmonary embolus. 2. Mild aortic atherosclerosis. 3. Cardiac enlargement. 4. Moderate bilateral pleural effusions with bilateral perihilar and basilar infiltration/atelectasis. 5. 4 mm nodule in the right upper lung. No follow-up needed if patient is low-risk.This recommendation follows the consensus statement: Guidelines for Management of Incidental Pulmonary Nodules Detected on CT Images: From the Fleischner Society 2017; Radiology 2017; 284:228-243. Electronically Signed   By: Elsie Gravely M.D.   On: 03/14/2024 16:08   DG Chest Port 1 View Result Date: 03/14/2024 CLINICAL DATA:  Chest pain.  Tachycardia. EXAM: PORTABLE CHEST 1 VIEW COMPARISON:  None Available. FINDINGS: Bilateral small pleural effusions noted with probable associated underlying compressive atelectatic changes. No pneumothorax on either side. Bilateral lung fields are otherwise clear. Normal cardio-mediastinal silhouette. No acute osseous abnormalities. The soft tissues are within normal limits. IMPRESSION: Bilateral small pleural effusions with probable underlying compressive atelectatic  changes. Electronically Signed   By: Ree Molt M.D.   On: 03/14/2024 13:02    Scheduled Meds:  amiodarone   400 mg Oral BID   Followed by   NOREEN ON 03/25/2024] amiodarone   200 mg Oral Daily   apixaban   5 mg Oral BID   docusate sodium   100 mg Oral BID   furosemide   40 mg Intravenous Q12H   losartan   25 mg Oral Daily   simvastatin   20 mg Oral QHS   Continuous Infusions:  amiodarone  30 mg/hr (03/15/24 1040)     LOS: 0 days  MDM: Patient is high risk for one or more organ failure.  They necessitate ongoing hospitalization for continued IV therapies and subsequent lab monitoring. Total time spent interpreting labs and vitals, reviewing the medical record, coordinating care amongst consultants and care team members, directly assessing and discussing care with the patient and/or family: 55 min  Praneeth Bussey, DO [facility]  To contact the attending physician between 7A-7P please use Epic Chat. To contact the covering physician during after hours 7P-7A, please review Amion.  03/15/2024, 4:01 PM   *This document has been created with the assistance of dictation software. Please excuse typographical errors. *

## 2024-03-15 NOTE — Progress Notes (Signed)
 Heart Failure Stewardship Pharmacy Note  PCP: Liana Fish, NP PCP-Cardiologist: None  HPI: Joann Warren is a 78 y.o. female with CHF, HTN, CKD, and recently diagnosed atrial fibrillation RVR in her PCP office and sent to the ED with a chief complain of dyspnea on exertion and dizziness. On admission, BNP was 1470.6, HS-troponin was 41, TSH 3.548, AST 90, ALT 67, and T4 was 1.21. Chest x-ray noted small bilateral pleural effusions. CTA negative for PE, but positive for moderate bilateral pleural effusions.   Pertinent cardiac history: TTE 08/2023 with LVEF 35%, mild AR, moderate MR, mild PR, and severe TR. TTE 12/2023 with LVEF of 25%, G2DD, mild AR, moderate MR, mild PR, moderate TR. TTE this admission with LVEF 20-25%, mild to moderate MR, moderate TR, and moderate pleural effusion.  Pertinent Lab Values: Creatinine, Ser  Date Value Ref Range Status  03/15/2024 1.37 (H) 0.44 - 1.00 mg/dL Final   BUN  Date Value Ref Range Status  03/15/2024 26 (H) 8 - 23 mg/dL Final  93/82/7974 12 8 - 27 mg/dL Final   Potassium  Date Value Ref Range Status  03/15/2024 4.0 3.5 - 5.1 mmol/L Final   Sodium  Date Value Ref Range Status  03/15/2024 138 135 - 145 mmol/L Final  02/02/2024 143 134 - 144 mmol/L Final   B Natriuretic Peptide  Date Value Ref Range Status  03/14/2024 1,470.6 (H) 0.0 - 100.0 pg/mL Final    Comment:    Performed at [facility], [address] [place], [place] [address]   Magnesium  Date Value Ref Range Status  03/14/2024 1.9 1.7 - 2.4 mg/dL Final    Comment:    HEMOLYSIS AT THIS LEVEL MAY AFFECT RESULT Performed at [facility], [address] [place], [place] [address]    Hemoglobin A1C  Date Value Ref Range Status  07/20/2023 5.4 4.0 - 5.6 % Final   TSH  Date Value Ref Range Status  03/14/2024 3.548 0.350 - 4.500 uIU/mL Final    Comment:    Performed by a 3rd Generation assay with a functional sensitivity of <=0.01  uIU/mL. Performed at [facility], [address] [place], [place] [address]   04/30/2022 3.270 0.450 - 4.500 uIU/mL Final    Vital Signs:  Temp:  [97.7 F (36.5 C)-98.5 F (36.9 C)] 97.7 F (36.5 C) (07/29 1119) Pulse Rate:  [45-153] 119 (07/29 0840) Cardiac Rhythm: Atrial fibrillation (07/29 0937) Resp:  [14-28] 19 (07/29 1057) BP: (85-136)/(65-99) 136/99 (07/29 1057) SpO2:  [90 %-100 %] 90 % (07/29 1057)  Intake/Output Summary (Last 24 hours) at 03/15/2024 1304 Last data filed at 03/15/2024 0849 Gross per 24 hour  Intake 260.24 ml  Output 250 ml  Net 10.24 ml    Current Heart Failure Medications:  Loop diuretic: furosemide  40 mg IV q12h Beta-Blocker: none ACEI/ARB/ARNI: none MRA: none SGLT2i: none Other: none  Prior to admission Heart Failure Medications:  Loop diuretic: none Beta-Blocker: metoprolol  succinate 25 mg daily ACEI/ARB/ARNI: losartan  25 mg daily MRA: none SGLT2i: none Other: amlodipine  5 mg daily  Assessment: 1. 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 NYHA class II-III symptoms.  -Symptoms: Reports feeling fine other than palpitations. No SOB or LEE.  -Volume: Suspect hypervolemia given elevated JVP. No LEE or abdominal edema. Continue diuresis at this time with furosemide  40 mg IV BID. -Hemodynamics: BP increasing this AM to 136/99 mmHg. HR 120-140s. CO2 is low and  LFTs mildly elevated, potentially low output CHF. Patient denies other symptoms of low output. -BB: Would avoid beta blocker at this time given severe LV dysfunction and low CO2. -ACEI/ARB/ARNI: Can consider adding pending BP after procedure today. -MRA: Consider adding if creatinine improves.  -SGLT2i: No recent UTIs noted. Can consider adding later this admission.  Plan: 1) Medication changes recommended at this time: -None   2) Patient assistance: -Copays for Eliquis , Entresto , Farxiga, and Jardiance are  $47  3) Education: - Patient has been educated on current HF medications and potential additions to HF medication regimen - Patient verbalizes understanding that over the next few months, these medication doses may change and more medications may be added to optimize HF regimen - Patient has been educated on basic disease state pathophysiology and goals of therapy  Medication Assistance / Insurance Benefits Check: Does the patient have prescription insurance?    Type of insurance plan:  Does the patient qualify for medication assistance through manufacturers or grants? Pending   Outpatient Pharmacy: Prior to admission outpatient pharmacy: [facility]      Please do not hesitate to reach out with questions or concerns,  Jaun Bash, PharmD, CPP, BCPS, [facility] Heart Failure Pharmacist  Phone - [phone] 03/15/2024 1:04 PM

## 2024-03-15 NOTE — Transfer of Care (Signed)
 Immediate Anesthesia Transfer of Care Note  Patient: Joann Warren  Procedure(s) Performed: ECHOCARDIOGRAM, TRANSESOPHAGEAL CARDIOVERSION  Patient Location:   Anesthesia Type:General  Level of Consciousness: drowsy and patient cooperative  Airway & Oxygen Therapy: Patient Spontanous Breathing and Patient connected to nasal cannula oxygen  Post-op Assessment: Post -op Vital signs reviewed and stable  Post vital signs: Reviewed and stable  Last Vitals:  Vitals Value Taken Time  BP    Temp    Pulse    Resp    SpO2      Last Pain:  Vitals:   03/15/24 1119  TempSrc: Temporal  PainSc:          Complications: No notable events documented.

## 2024-03-15 NOTE — Progress Notes (Signed)
*  PRELIMINARY RESULTS* Echocardiogram Echocardiogram Transesophageal has been performed.  Floydene Harder 03/15/2024, 1:07 PM

## 2024-03-15 NOTE — Consult Note (Addendum)
 [facility] CARDIOLOGY CONSULT NOTE       Patient ID: Joann Warren MRN: [id number] DOB/AGE: 01/30/1946 78 y.o.  Admit date: 03/14/2024 Referring Physician Dr. Leesa Primary Physician Liana Fish, NP Primary Cardiologist Dr. Brook Tinnie Maiden, NP Reason for Consultation AF RVR  HPI: Joann Warren is a 77 y.o. female  with a past medical history of HFrEF (diagnosed 08/2023), hypertension, hyperlipidemia who presented to the ED on 03/14/2024 after being sent from outpatient cardiology due to newfound atrial fibrillation with RVR with rates in the 140s.  Patient states she has had a palpitations, lightheadedness, shortness of breath for the past 4 days. Cardiology was consulted for further evaluation of AF RVR.  Work up in the ED notable for sodium 140, potassium 4.2, magnesium 1.9, creatinine 1.36, hemoglobin 12, platelets 263.  LFTs mildly elevated.  BNP elevated at 1400.  Troponins minimally elevated and flat at 41 >22.  D-dimer elevated at 1.71.  CTA with no evidence of pulmonary embolism; with moderate bilateral pleural effusions.  EKG in ED with A-fib RVR, rate 170 bpm per telemetry patient remains in A-fib RVR with rates 130-140s.  Patient was given IV diltiazem  x2 mg and started on IV amiodarone  drip.  While on amiodarone  drip patient's heart rates remains elevated.  At the time of my evaluation this AM, patient was resting comfortably in ED stretcher with family at bedside.  We discussed patient's symptoms in further detail.  Patient states she was sent by outpatient cardiology due to found new atrial fibrillation with elevated heart rates.  Patient states since Friday she has been having palpitations, lightheadedness, shortness of breath.  Patient denies any chest pain.  Patient states she has no known history of MI or CAD and has never had LHC.  Patient states she is very active walks and performs all ADLs with no concerns.   Pertinent Cardiac History (Most  recent) Cardiac stress test (09/2023) LVEF= 48%   FINDINGS:  Regional wall motion:  demonstrates  hypokinesis of the global walls  borderline hypo .  The overall quality of the study is good.   Artifacts noted: no  Left ventricular cavity: normal.   Review of systems complete and found to be negative unless listed above    Past Medical History:  Diagnosis Date   Actinic keratosis    Anemia    Basal cell carcinoma 12/23/2007   Upper back.   Basal cell carcinoma 05/17/2009   Right med lower leg   Basal cell carcinoma 07/06/2014   Left medial breast   Basal cell carcinoma 03/01/2015   Right medial mid pretibial   Basal cell carcinoma 07/16/2015   Right superior breast   Basal cell carcinoma 05/21/2016   Left inferior medial breast   Basal cell carcinoma 02/08/2018   Left medial breast   Basal cell carcinoma 11/01/2018   Left medial breast   Basal cell carcinoma 03/30/2019   Left med inf breast   Basal cell carcinoma 09/05/2019   Left proximal bicep   Basal cell carcinoma 12/08/2019   Right forehead. Nodular pattern.   Basal cell carcinoma 12/13/2019   Left med. breast inf. Nodular and infiltrative patterns. EDC   Basal cell carcinoma 12/13/2019   Left med. breast. sup. Nodular pattern. EDC   Basal cell carcinoma 05/14/2020   Right upper back. BCC with focal sclerosis. EDC.   Basal cell carcinoma 12/25/2020   Left inf med breast EDC   Basal cell carcinoma 07/03/2021   Right bicep EDC  Basal cell carcinoma 06/10/2022   left inferior medial breast   Cancer (HCC)    squamous cell   Glaucoma    Hyperlipidemia    Hypertension    Osteoporosis    SCC (squamous cell carcinoma) 12/16/2021   R forearm, EDC   SCC (squamous cell carcinoma) 12/16/2021   L pretibial, EDC   SCC (squamous cell carcinoma) 04/23/2022   Left mid distal pretibial lat - EDC   SCC (squamous cell carcinoma) 04/23/2022   Left mid distal pretibial med - EDC   SCC (squamous cell carcinoma)  08/28/2022   L distal lat pretibial   SCC (squamous cell carcinoma) 08/28/2022   L proximal pretibial   SCC (squamous cell carcinoma) 12/31/2022   R lower leg anterior, EDC   SCC (squamous cell carcinoma) 12/31/2022   L lower leg anterior, EDC   SCC (squamous cell carcinoma) 12/31/2022   L chest superior, EDC   SCC (squamous cell carcinoma) 12/31/2022   L chest inferior, EDC   SCC (squamous cell carcinoma) 07/02/2023   right middle pretibial sup medial tx EDC   SCC (squamous cell carcinoma) 07/02/2023   right middle pretibial middle tx with ED&C   SCC (squamous cell carcinoma) 07/02/2023   right middle pretibial inferior tx with ED&C   SCC (squamous cell carcinoma) 07/02/2023   right lower leg inferior medial knee tx with ED&C   SCC (squamous cell carcinoma) 12/30/2023   well differentiated - right lateral lower leg - ED&C done   SCC (squamous cell carcinoma) 12/30/2023   left lateral lower leg proximal - ED&C done   SCC (squamous cell carcinoma) 12/30/2023   left lateral lower leg distal near ankle - ed&C done   Squamous cell carcinoma of skin 12/08/2007   Left lower leg. WD SCC   Squamous cell carcinoma of skin 02/01/2008   Left distal med. pretibial. WD SCC   Squamous cell carcinoma of skin 02/01/2008   Right mid pretibial. WD SCC   Squamous cell carcinoma of skin 02/01/2008   Right mid pretibal inferior. WD SCC with superficial infiltration.   Squamous cell carcinoma of skin 04/05/2008   Left dorsum hand. WD SCC with superficial infiltration.   Squamous cell carcinoma of skin 06/15/2008   Right lower leg inferior. WD SCC   Squamous cell carcinoma of skin 06/15/2008   Right lower leg superior. SCCis hypertrophic   Squamous cell carcinoma of skin 07/05/2008   Right lat. lower leg, Right lower leg, Left lower leg   Squamous cell carcinoma of skin 08/04/2008   Right ant. lower leg, Left med. lower leg   Squamous cell carcinoma of skin 09/01/2008   Right post. lower leg    Squamous cell carcinoma of skin 10/04/2008   Left post leg   Squamous cell carcinoma of skin 12/01/2008   Left lower leg   Squamous cell carcinoma of skin 03/08/2009   Right ant. mid lower leg, Right lower leg   Squamous cell carcinoma of skin 05/17/2009   Left lower post. leg superior   Squamous cell carcinoma of skin 10/11/2009   Left lower leg. KA-pattern   Squamous cell carcinoma of skin 03/28/2010   Right lower leg   Squamous cell carcinoma of skin 03/25/2013   Right lower leg   Squamous cell carcinoma of skin 06/23/2013   Right post. lower leg. Right medial lower leg. Left medial lower leg   Squamous cell carcinoma of skin 09/09/2013   Left anterior upper arm. Right anterior lower leg. Right  lat. lower leg. Right below knee lower leg   Squamous cell carcinoma of skin 12/08/2013   Right posterior lower leg sup. Right forearm   Squamous cell carcinoma of skin 03/03/2014   Right posterior leg. Right med. lower leg   Squamous cell carcinoma of skin 06/22/2014   Right to of shoulder anterior. Right top of shoulder posterior   Squamous cell carcinoma of skin 08/03/2014   Right anterior forearm   Squamous cell carcinoma of skin 09/14/2014   Right chest   Squamous cell carcinoma of skin 10/30/2014   Right calf   Squamous cell carcinoma of skin 12/14/2014   Right med. distal pretibial sup. Right med distal pretibial inf. Left proximal bicep sup. Left proximal bicep inf.   Squamous cell carcinoma of skin 03/01/2015   Left lat proximal pretibial near knee   Squamous cell carcinoma of skin 03/29/2015   Left mid to prox. pretibial. Left mid. lat. pretibial.    Squamous cell carcinoma of skin 04/19/2015   Right mid calf. Right distal lat. pretibial   Squamous cell carcinoma of skin 05/31/2015   Right proximal med. pretibial ant. Right proximal med. pretibial posterior   Squamous cell carcinoma of skin 07/16/2015   Right upper arm inf. deltoid   Squamous cell carcinoma of skin  08/06/2015   Left distal pretibial   Squamous cell carcinoma of skin 09/12/2015   Left postauricular area   Squamous cell carcinoma of skin 12/12/2015   Left proximal pretibial x4 sites   Squamous cell carcinoma of skin 12/31/2015   Right med. lower leg above ankle sup. Right med. lower leg above ankle inf. Left inf. lat. calf med. Left inf. calf lat.   Squamous cell carcinoma of skin 01/31/2016   Right med. sup. ankle area. Right med mid proximal calf. Left dorsum foot.    Squamous cell carcinoma of skin 03/17/2016   Left post. distal calf lateral. Left post distal calf med. Left mid lat ant. thigh. Right mid lat. calf   Squamous cell carcinoma of skin 05/21/2016   Right proximal medial pretibial. Right mid med. pretibial.   Squamous cell carcinoma of skin 07/03/2016   Right mid med. pretibial. Right proximal med. pretibial   Squamous cell carcinoma of skin 08/20/2016   Right medial calf. Left distal pretibial. Right upper back paraspinal   Squamous cell carcinoma of skin 10/30/2016   Right medial calf x2   Squamous cell carcinoma of skin 12/10/2016   left mid back 6.0cm lat to spine. Left sup. lat. calf. Left inf. post. calf   Squamous cell carcinoma of skin 12/25/2016   Right medial above ankle. Right mid pretibial. Right prox. pretibial. Right prox. pretibial med.    Squamous cell carcinoma of skin 01/19/2017   Left forearm. Left ant. neck   Squamous cell carcinoma of skin 03/23/2017   Left med. pretibial below knee   Squamous cell carcinoma of skin 04/13/2017   Right lat. sup. ankle. Right medial ankle   Squamous cell carcinoma of skin 04/27/2017   Left med. distal prox. calf. Left medial distal calf. Left lat. distal prox. calf. Left lat distal distal calf   Squamous cell carcinoma of skin 05/14/2017   Left bicep. Left dorsum mid forearm   Squamous cell carcinoma of skin 06/25/2017   Right med. distal calf x3   Squamous cell carcinoma of skin 09/02/2017   Left med.  infraclavicular   Squamous cell carcinoma of skin 12/21/2017   Left prox. med. lower leg. Right  forearm. Left forearm. Left bicep   Squamous cell carcinoma of skin 01/07/2018   Left dorsum foot prox. Left dorsum foot distal. Left mid to distal calf. Right chest   Squamous cell carcinoma of skin 03/08/2018   Left bicep. Right proximal dorsum forearm. Right distal med. pretibial sup. Right distal med pretibial inf.    Squamous cell carcinoma of skin 04/05/2018   Right sup. med scapula   Squamous cell carcinoma of skin 06/03/2018   Left med. ankle sup. Left med. ankle inf. Left lateral ankle.   Squamous cell carcinoma of skin 09/01/2018   Right mid lat. calf. Right lat. knee. Prox post calf   Squamous cell carcinoma of skin 11/01/2018   Left lateral ankle   Squamous cell carcinoma of skin 12/08/2018   Right lateral ankle lat malleolus   Squamous cell carcinoma of skin 01/05/2019   Right lateral elbow. Left mid medial calf. Left prox. ant. thigh. Right distal lat tricep   Squamous cell carcinoma of skin 01/19/2019   Right popliteal   Squamous cell carcinoma of skin 02/03/2019   Left mid lat calf. Left mid calf. Left dorsum lat distal foot. Left dorsum distal foot   Squamous cell carcinoma of skin 03/30/2019   Right lower leg above med ankle ant. Right lower leg above ankle sup. Right lower leg above the med ankle inf.   Squamous cell carcinoma of skin 05/12/2019   Right chest medial. Left deltoid   Squamous cell carcinoma of skin 06/23/2019   Right ant thigh distal above knee. Left med distal thigh. Left ant thigh above knee   Squamous cell carcinoma of skin 07/20/2019   Right chest. Right dorsal hand.   Squamous cell carcinoma of skin 08/03/2019   Left distal lat pretibial. Left distal med calf. Right prox. lat calf   Squamous cell carcinoma of skin 09/05/2019   Right middle bicep. Right sup lat bicep. Right sup mid bicep. Right sup med bicep   Squamous cell carcinoma of skin  09/15/2019   Right proximal med pretibial. Left distal lat thigh   Squamous cell carcinoma of skin 04/02/2020   Right calf, bleow right medial knee sup, below right medial knee post, below right medial knee inf   Squamous cell carcinoma of skin 05/14/2020   Right ant. shoulder. Mod. Diff. SCC. EDC   Squamous cell carcinoma of skin 06/27/2020   R mid to distal pretibial - ED&C   Squamous cell carcinoma of skin 06/27/2020   Below the R knee - ED&C    Squamous cell carcinoma of skin 06/27/2020   R prox pretibial    Squamous cell carcinoma of skin 06/27/2020   L distal med calf   Squamous cell carcinoma of skin 08/06/2020   Left ant deltoid   Squamous cell carcinoma of skin 12/25/2020   Left lat ankle EDC   Squamous cell carcinoma of skin 03/27/2021   left knee, EDC   Squamous cell carcinoma of skin 03/27/2021   left lateral leg, EDC   Squamous cell carcinoma of skin 07/03/2021   Left lat neck - EDC   Squamous cell carcinoma of skin 07/03/2021   Right ant thigh - EDC   Squamous cell carcinoma of skin 07/03/2021   Right prox lat pretibial - EDC   Squamous cell carcinoma of skin 10/16/2021   right med mid calf, EDC   Squamous cell carcinoma of skin 10/16/2021   left medial prox pretibial, EDC   Squamous cell carcinoma of skin 10/16/2021  left distal pretibial, EDC   Squamous cell carcinoma of skin 03/26/2023   Left ant lat ankle EDC   Squamous cell carcinoma of skin 03/26/2023   Left medial lower leg above ankle EDC EDC   Squamous cell carcinoma of skin 03/26/2023   Right lat ankle sup EDC   Squamous cell carcinoma of skin 03/26/2023   Right lat ankle middle EDC   Squamous cell carcinoma of skin 03/26/2023   Right lat ankle inf EDC   Squamous cell carcinoma of skin 10/15/2023   left distal pretibial, EDC   Squamous cell carcinoma of skin 10/15/2023   left proximal pretibial, EDC   Squamous cell carcinoma of skin 10/15/2023   right middle medial lower leg, EDC    Past  Surgical History:  Procedure Laterality Date   BREAST EXCISIONAL BIOPSY Left 1972   neg   BREAST EXCISIONAL BIOPSY Right 1980   neg   COLONOSCOPY     ESOPHAGOGASTRODUODENOSCOPY (EGD) WITH PROPOFOL  N/A 04/09/2015   Procedure: ESOPHAGOGASTRODUODENOSCOPY (EGD) WITH PROPOFOL ;  Surgeon: Lamar ONEIDA Holmes, MD;  Location: [facility] ENDOSCOPY;  Service: Endoscopy;  Laterality: N/A;   skin cancer removal      Medications Prior to Admission  Medication Sig Dispense Refill Last Dose/Taking   alendronate  (FOSAMAX ) 70 MG tablet TAKE 1 TABLET EVERY 7 DAYS WITH A FULL GLASS OF WATER ON AN EMPTY STOMACH DO NOT LIE DOWN FOR AT LEAST 30 MIN 12 tablet 4 Past Week   amLODipine  (NORVASC ) 5 MG tablet TAKE 1 TABLET BY MOUTH ONCE DAILY 90 tablet 3 03/14/2024 Morning   hydrOXYzine  (ATARAX ) 10 MG tablet TAKE ONE TABLET ONCE OR TWICE A DAY IF NEEDED FOR ITCH 30 tablet 2 Unknown   losartan  (COZAAR ) 25 MG tablet Take 25 mg by mouth daily.   03/14/2024 Morning   metoprolol  succinate (TOPROL -XL) 25 MG 24 hr tablet Take 25 mg by mouth daily.   03/14/2024 Morning   simvastatin  (ZOCOR ) 20 MG tablet Take 1 tablet (20 mg total) by mouth at bedtime. 90 tablet 3 03/13/2024 Evening   Apoaequorin (PREVAGEN) 10 MG CAPS Take 10 mg by mouth daily.   Unknown   bimatoprost (LUMIGAN) 0.01 % SOLN Place 1 drop into the right eye at bedtime. (Patient not taking: Reported on 03/14/2024)   Not Taking   fluorouracil  (EFUDEX ) 5 % cream Apply twice daily to affected areas on left foot up to 2 weeks (Patient not taking: Reported on 03/14/2024) 30 g 2 Not Taking   mupirocin  ointment (BACTROBAN ) 2 % Apply 1 Application topically daily. Qd to wounds for a 14 day supply (Patient not taking: Reported on 03/14/2024) 22 g 11 Not Taking   Social History   Socioeconomic History   Marital status: Married    Spouse name: Not on file   Number of children: Not on file   Years of education: Not on file   Highest education level: Not on file  Occupational History    Not on file  Tobacco Use   Smoking status: Never   Smokeless tobacco: Never  Vaping Use   Vaping status: Never Used  Substance and Sexual Activity   Alcohol use: No   Drug use: No   Sexual activity: Not on file  Other Topics Concern   Not on file  Social History Narrative   Not on file   Social Drivers of Health   Financial Resource Strain: Not on file  Food Insecurity: Not on file  Transportation Needs: Not on file  Physical  Activity: Not on file  Stress: Not on file  Social Connections: Not on file  Intimate Partner Violence: Not on file    Family History  Problem Relation Age of Onset   Breast cancer Sister 70   Atrial fibrillation Mother      Vitals:   03/15/24 1119 03/15/24 1306 03/15/24 1315 03/15/24 1330  BP:  119/79 116/73 117/76  Pulse:   90 92  Resp:  16 20 19   Temp: 97.7 F (36.5 C)     TempSrc: Temporal     SpO2:  99% 99% 98%  Weight:      Height:        PHYSICAL EXAM General: Well-appearing elderly female, well nourished, in no acute distress. HEENT: Normocephalic and atraumatic. Neck: + JVD.   Lungs: Normal respiratory effort on room air.  Diminished breath sounds bilaterally Heart: HRRR. Normal S1 and S2 without gallops or murmurs.  Abdomen: Non-distended appearing.  Msk: Normal strength and tone for age. Extremities: Warm and well perfused. No clubbing, cyanosis, edema.  Neuro: Alert and oriented X 3. Psych: Answers questions appropriately.   Labs: Basic Metabolic Panel: Recent Labs    03/14/24 1230 03/14/24 1339 03/15/24 1040  NA 140  --  138  K 4.2  --  4.0  CL 103  --  104  CO2 21*  --  21*  GLUCOSE 124*  --  123*  BUN 24*  --  26*  CREATININE 1.36*  --  1.37*  CALCIUM 9.0  --  8.4*  MG  --  1.9  --    Liver Function Tests: Recent Labs    03/14/24 1339  AST 90*  ALT 67*  ALKPHOS 93  BILITOT 0.8  PROT 6.5  ALBUMIN 3.5   Recent Labs    03/14/24 1339  LIPASE 33   CBC: Recent Labs    03/14/24 1230  WBC 8.8   HGB 12.0  HCT 39.0  MCV 92.2  PLT 263   Cardiac Enzymes: Recent Labs    03/14/24 1230 03/14/24 1450  TROPONINIHS 41* 22*   BNP: Recent Labs    03/14/24 1230  BNP 1,470.6*   D-Dimer: Recent Labs    03/14/24 1339  DDIMER 1.71*   Hemoglobin A1C: No results for input(s): HGBA1C in the last 72 hours. Fasting Lipid Panel: No results for input(s): CHOL, HDL, LDLCALC, TRIG, CHOLHDL, LDLDIRECT in the last 72 hours. Thyroid  Function Tests: Recent Labs    03/14/24 1339  TSH 3.548   Anemia Panel: No results for input(s): VITAMINB12, FOLATE, FERRITIN, TIBC, IRON, RETICCTPCT in the last 72 hours.   Radiology: ECHOCARDIOGRAM COMPLETE Result Date: 03/15/2024    ECHOCARDIOGRAM REPORT   Patient Name:   Joann Warren Date of Exam: 03/15/2024 Medical Rec #:  [id number]        Height:       62.0 in Accession #:    [id number]       Weight:       106.0 lb Date of Birth:  1946/05/28        BSA:          1.460 m Patient Age:    78 years         BP:           127/71 mmHg Patient Gender: F                HR:           117 bpm. Exam Location:  [facility] Procedure: 2D Echo, Cardiac Doppler and Color Doppler (Both Spectral and Color            Flow Doppler were utilized during procedure). STAT ECHO Indications:     Atrial Fibrillation I48.91  History:         Patient has no prior history of Echocardiogram examinations.                  Risk Factors:Hypertension and Dyslipidemia.  Sonographer:     Christopher Furnace Referring Phys:  [id number] LORANE POLAND Diagnosing Phys: Keller Paterson  Sonographer Comments: Suboptimal apical window. Image acquisition challenging due to patient body habitus. IMPRESSIONS  1. Left ventricular ejection fraction, by estimation, is 20 to 25%. The left ventricle has severely decreased function. The left ventricle demonstrates global hypokinesis. There is mild left ventricular hypertrophy. Left ventricular diastolic parameters  are indeterminate.  2. Right  ventricular systolic function was not well visualized. The right ventricular size is not well visualized. There is mildly elevated pulmonary artery systolic pressure. The estimated right ventricular systolic pressure is 42.1 mmHg.  3. Left atrial size was moderately dilated.  4. Right atrial size was moderately dilated.  5. Moderate pleural effusion.  6. The mitral valve is normal in structure. Mild to moderate mitral valve regurgitation.  7. Tricuspid valve regurgitation is moderate.  8. The aortic valve is tricuspid. There is mild thickening of the aortic valve. Aortic valve regurgitation is trivial.  9. The inferior vena cava is normal in size with <50% respiratory variability, suggesting right atrial pressure of 8 mmHg. FINDINGS  Left Ventricle: Left ventricular ejection fraction, by estimation, is 20 to 25%. The left ventricle has severely decreased function. The left ventricle demonstrates global hypokinesis. The left ventricular internal cavity size was normal in size. There is mild left ventricular hypertrophy. Left ventricular diastolic parameters are indeterminate. Right Ventricle: The right ventricular size is not well visualized. Right vetricular wall thickness was not well visualized. Right ventricular systolic function was not well visualized. There is mildly elevated pulmonary artery systolic pressure. The tricuspid regurgitant velocity is 2.92 m/s, and with an assumed right atrial pressure of 8 mmHg, the estimated right ventricular systolic pressure is 42.1 mmHg. Left Atrium: Left atrial size was moderately dilated. Right Atrium: Right atrial size was moderately dilated. Pericardium: There is no evidence of pericardial effusion. Mitral Valve: The mitral valve is normal in structure. Mild to moderate mitral valve regurgitation. Tricuspid Valve: The tricuspid valve is normal in structure. Tricuspid valve regurgitation is moderate. Aortic Valve: The aortic valve is tricuspid. There is mild thickening of  the aortic valve. Aortic valve regurgitation is trivial. Aortic valve mean gradient measures 2.0 mmHg. Aortic valve peak gradient measures 3.4 mmHg. Aortic valve area, by VTI measures 1.70 cm. Pulmonic Valve: The pulmonic valve was not well visualized. Pulmonic valve regurgitation is trivial. Aorta: The aortic root is normal in size and structure. Venous: The inferior vena cava is normal in size with less than 50% respiratory variability, suggesting right atrial pressure of 8 mmHg. IAS/Shunts: The atrial septum is grossly normal. Additional Comments: There is a moderate pleural effusion.  LEFT VENTRICLE PLAX 2D LVIDd:         4.30 cm LVIDs:         4.00 cm LV PW:         1.00 cm LV IVS:        1.10 cm LVOT diam:     2.00 cm LV SV:  22 LV SV Index:   15 LVOT Area:     3.14 cm  LEFT ATRIUM             Index        RIGHT ATRIUM           Index LA diam:        4.80 cm 3.29 cm/m   RA Area:     20.70 cm LA Vol (A2C):   55.3 ml 37.89 ml/m  RA Volume:   61.40 ml  42.07 ml/m LA Vol (A4C):   62.9 ml 43.09 ml/m LA Biplane Vol: 60.5 ml 41.45 ml/m  AORTIC VALVE                    PULMONIC VALVE AV Area (Vmax):    1.62 cm     PR End Diast Vel: 8.18 msec AV Area (Vmean):   1.43 cm AV Area (VTI):     1.70 cm AV Vmax:           91.60 cm/s AV Vmean:          65.600 cm/s AV VTI:            0.132 m AV Peak Grad:      3.4 mmHg AV Mean Grad:      2.0 mmHg LVOT Vmax:         47.10 cm/s LVOT Vmean:        29.800 cm/s LVOT VTI:          0.072 m LVOT/AV VTI ratio: 0.54  AORTA Ao Root diam: 3.10 cm MITRAL VALVE                  TRICUSPID VALVE MV Area (PHT): 6.12 cm       TR Peak grad:   34.1 mmHg MV Decel Time: 124 msec       TR Vmax:        292.00 cm/s MR Peak grad:    49.8 mmHg MR Mean grad:    31.0 mmHg    SHUNTS MR Vmax:         353.00 cm/s  Systemic VTI:  0.07 m MR Vmean:        259.0 cm/s   Systemic Diam: 2.00 cm MR PISA:         1.01 cm MR PISA Eff ROA: 11 mm MR PISA Radius:  0.40 cm MV E velocity: 95.40 cm/s  Keller Paterson Electronically signed by Keller Paterson Signature Date/Time: 03/15/2024/11:17:07 AM    Final    CT Angio Chest PE W and/or Wo Contrast Result Date: 03/14/2024 CLINICAL DATA:  New onset atrial fibrillation for 3 days. Elevated D-dimer. Lightheadedness and weakness with exertion. Palpitations. EXAM: CT ANGIOGRAPHY CHEST WITH CONTRAST TECHNIQUE: Multidetector CT imaging of the chest was performed using the standard protocol during bolus administration of intravenous contrast. Multiplanar CT image reconstructions and MIPs were obtained to evaluate the vascular anatomy. RADIATION DOSE REDUCTION: This exam was performed according to the departmental dose-optimization program which includes automated exposure control, adjustment of the mA and/or kV according to patient size and/or use of iterative reconstruction technique. CONTRAST:  60mL OMNIPAQUE  IOHEXOL  350 MG/ML SOLN COMPARISON:  Chest radiograph 03/14/2024 FINDINGS: Cardiovascular: Technically adequate study with good opacification of the central and segmental pulmonary arteries. Mild motion artifact. No focal filling defects are demonstrated. No evidence of significant pulmonary embolus. Cardiac enlargement. No pericardial effusions. Normal caliber thoracic aorta. Calcification in the aorta and coronary arteries.  Mediastinum/Nodes: Thyroid  gland is unremarkable. Esophagus is decompressed. No significant lymphadenopathy. Lungs/Pleura: Moderate bilateral pleural effusions with bilateral perihilar and basilar atelectasis or consolidation. This may represent compressive atelectasis, pneumonia, edema, aspiration, or combination. No pneumothorax. 4 mm nodule in the right upper lung, series 5, image 46. Upper Abdomen: No acute abnormality. Musculoskeletal: Slight anterior subluxations at C6-7 and C7-T1 levels. In the absence of known trauma, this is likely degenerative or chronic. Otherwise normal alignment of the thoracic spine. Degenerative changes in the  spine. No vertebral compression deformities. Review of the MIP images confirms the above findings. IMPRESSION: 1. No evidence of significant pulmonary embolus. 2. Mild aortic atherosclerosis. 3. Cardiac enlargement. 4. Moderate bilateral pleural effusions with bilateral perihilar and basilar infiltration/atelectasis. 5. 4 mm nodule in the right upper lung. No follow-up needed if patient is low-risk.This recommendation follows the consensus statement: Guidelines for Management of Incidental Pulmonary Nodules Detected on CT Images: From the Fleischner Society 2017; Radiology 2017; 284:228-243. Electronically Signed   By: Elsie Gravely M.D.   On: 03/14/2024 16:08   DG Chest Port 1 View Result Date: 03/14/2024 CLINICAL DATA:  Chest pain.  Tachycardia. EXAM: PORTABLE CHEST 1 VIEW COMPARISON:  None Available. FINDINGS: Bilateral small pleural effusions noted with probable associated underlying compressive atelectatic changes. No pneumothorax on either side. Bilateral lung fields are otherwise clear. Normal cardio-mediastinal silhouette. No acute osseous abnormalities. The soft tissues are within normal limits. IMPRESSION: Bilateral small pleural effusions with probable underlying compressive atelectatic changes. Electronically Signed   By: Ree Molt M.D.   On: 03/14/2024 13:02    ECHO as above (EF 20-25%)  01/05/24: EF 25%  08/25/2023: EF 30%  TELEMETRY reviewed by me 03/15/2024: Atrial fibrillation RVR, rates 130-140s. S/p TEE/DCCV (07/29): Sinus rhythm, PAC, rate 89 bpm  EKG reviewed by me: Atrial fibrillation with RVR, rate 170 bpm  Data reviewed by me 03/15/2024: last 24h vitals tele labs imaging I/O ED provider note, admission H&P.  Principal Problem:   Atrial fibrillation, rapid (HCC)    ASSESSMENT AND PLAN:  Joann Warren is a 78 y.o. female  with a past medical history of HFrEF (diagnosed 08/2023), hypertension, hyperlipidemia who presented to the ED on 03/14/2024 after being sent  from outpatient cardiology due to newfound atrial fibrillation with RVR with rates in the 140s.  Patient states she has had a palpitations, lightheadedness, shortness of breath for the past 4 days. Cardiology was consulted for further evaluation of AF RVR.  # New atrial fibrillation RVR  EKG in ED with A-fib RVR, rate 170 bpm per telemetry patient remains in A-fib RVR with rates 130-140s this AM.  Patient underwent TEE/DCCV with Dr. Wilburn (07/29) and successfully converted to sinus rhythm with PACs, rate 80s. Per tele has remains in SR. -Continue IV amio gtt. Likely discontinue tomorrow. -Ordered PO amio 400 mg BID for 10 days, then 200 mg daily.  -Continue PO Eliquis  5 mg BID for stroke risk reduction with high CHA2DS2VASc.  # HFrEF (EF 20%) - recently diagnosed 08/2023 BNP elevated at 1400. CTA with moderate bilateral pleural effusions. Echo this admission with EF 20%, global hypokinesis, mild MR, mod TR, mildly elevated PASP. Echo from 12/2023 with EF 25%, slightly more reduced this admission, likely due to AF RVR. Patient appears volume up. -Continue IV lasix  40 mg BID.  Closely monitor UOP and renal function. -Resume home losartan  25 mg daily. Consider transitioning to Entresto  tomorrow if BP allows. (Entresto  Copay $47) -Will consider spironolactone if renal function stabilizes. -  Will not resume home Jardiance at this time. As patient stopped taking it due to weight loss. (Copay $47).   -Will avoid BB at this time due to concern for low CO with mildly elevated LFTs. Lactate ordered. -Plan to optimize GDMT as renal function and BP allows. -Consider outpatient ischemic eval and outpatient ICD evaluation.   # Hypertension # Hyperlipidemia # Demand ischemia Patient without chest pain. Troponins minimally elevated and flat at 41 >22. EKG without acute ischemic changes.  -Continue home simvastatin  20 mg daily. -Losartan  as stated above. -Minimally elevated and flat tropes in the setting of  A-fib RVR is most consistent with demand/supply mismatch and not ACS.   This patient's plan of care was discussed and created with Dr. Wilburn and he is in agreement.  Signed: Dorene Comfort, PA-C  03/15/2024, 1:41 PM [facility]

## 2024-03-15 NOTE — Progress Notes (Signed)
*  PRELIMINARY RESULTS* Echocardiogram 2D Echocardiogram has been performed.  Floydene Harder 03/15/2024, 1:06 PM

## 2024-03-15 NOTE — Procedures (Signed)
 Electrical Cardioversion Procedure Note  Indication: Atrial Fibrillation  Procedure Details: Consent: Indication, Risk/benefits of procedure as well as the alternatives explained to patient and informed consent obtained. Time out performed. Verified patient identification, verified procedure, verified correct patient position, special equipment/implants available, medications/allergies/relevent history reviewed, required imaging and test results reviewed.  Deep sedation was provided by anesthesia with propofol. Patient was delivered with 200 Joules of electricity X 1 with success to Sinus rhythm. Patient tolerated the procedure well. No immediate complication noted.   Successful cardioversion  Windell Norfolk, MD [facility]

## 2024-03-15 NOTE — Progress Notes (Signed)
 Heart Failure Navigator Progress Note  Assessed for Heart & Vascular TOC clinic readiness.  Patient does not meet criteria due to current [facility] patient of Dr. Melton Alar.   Navigator will sign off at this time.  Roxy Horseman, RN, BSN [facility] Heart Failure Navigator Secure Chat Only

## 2024-03-15 NOTE — Telephone Encounter (Addendum)
 Patient Product/process development scientist completed.    The patient is insured through [employer]/ [employer]. Patient has Medicare and is not eligible for a copay card, but may be able to apply for patient assistance or Medicare RX Payment Plan (Patient Must reach out to their plan, if eligible for payment plan), if available.    Ran test claim for Eliquis  5 mg and the current 30 day co-pay is $47.00.  Ran test claim for Entresto  24-26 mg and the current 30 day co-pay is $47.00.  Ran test claim for Farxiga 10 mg and the current 30 day co-pay is $47.00.  Ran test claim for Jardiance 10 mg and the current 30 day co-pay is $47.00.  This test claim was processed through [facility]- copay amounts may vary at other pharmacies due to pharmacy/plan contracts, or as the patient moves through the different stages of their insurance plan.     Reyes Sharps, CPHT Pharmacy Technician III Certified Patient Advocate [facility] Patient Advocate Team Direct Number: [phone]  Fax: [phone]

## 2024-03-15 NOTE — ED Notes (Signed)
 Echo completed at this time. Cardiologist at bedside.

## 2024-03-15 NOTE — Anesthesia Postprocedure Evaluation (Signed)
 Anesthesia Post Note  Patient: Joann Warren  Procedure(s) Performed: ECHOCARDIOGRAM, TRANSESOPHAGEAL CARDIOVERSION  Patient location during evaluation: PACU Anesthesia Type: General Level of consciousness: awake and alert, oriented and patient cooperative Pain management: pain level controlled Vital Signs Assessment: post-procedure vital signs reviewed and stable Respiratory status: spontaneous breathing, nonlabored ventilation and respiratory function stable Cardiovascular status: blood pressure returned to baseline and stable Postop Assessment: adequate PO intake Anesthetic complications: no   No notable events documented.   Last Vitals:  Vitals:   03/15/24 1306 03/15/24 1315  BP: 119/79 116/73  Pulse:  90  Resp: 16 20  Temp:    SpO2: 99% 99%    Last Pain:  Vitals:   03/15/24 1119  TempSrc: Temporal  PainSc:                  Alfonso Ruths

## 2024-03-16 ENCOUNTER — Encounter: Payer: Self-pay | Admitting: Cardiology

## 2024-03-16 DIAGNOSIS — I4891 Unspecified atrial fibrillation: Secondary | ICD-10-CM | POA: Diagnosis not present

## 2024-03-16 LAB — BASIC METABOLIC PANEL WITH GFR
Anion gap: 12 (ref 5–15)
BUN: 24 mg/dL — ABNORMAL HIGH (ref 8–23)
CO2: 25 mmol/L (ref 22–32)
Calcium: 8.4 mg/dL — ABNORMAL LOW (ref 8.9–10.3)
Chloride: 100 mmol/L (ref 98–111)
Creatinine, Ser: 1.44 mg/dL — ABNORMAL HIGH (ref 0.44–1.00)
GFR, Estimated: 37 mL/min — ABNORMAL LOW (ref >60)
Glucose, Bld: 102 mg/dL — ABNORMAL HIGH (ref 70–99)
Potassium: 3.2 mmol/L — ABNORMAL LOW (ref 3.5–5.1)
Sodium: 137 mmol/L (ref 135–145)

## 2024-03-16 LAB — CBC
HCT: 36.4 % (ref 36.0–46.0)
Hemoglobin: 11.6 g/dL — ABNORMAL LOW (ref 12.0–15.0)
MCH: 28 pg (ref 26.0–34.0)
MCHC: 31.9 g/dL (ref 30.0–36.0)
MCV: 87.7 fL (ref 80.0–100.0)
Platelets: 230 K/uL (ref 150–400)
RBC: 4.15 MIL/uL (ref 3.87–5.11)
RDW: 15.2 % (ref 11.5–15.5)
WBC: 8.2 K/uL (ref 4.0–10.5)
nRBC: 0 % (ref 0.0–0.2)

## 2024-03-16 MED ORDER — FUROSEMIDE 20 MG PO TABS
20.0000 mg | ORAL_TABLET | Freq: Every day | ORAL | Status: DC
  Administered 2024-03-16: 20 mg via ORAL
  Filled 2024-03-16: qty 1

## 2024-03-16 MED ORDER — METOPROLOL SUCCINATE ER 50 MG PO TB24
50.0000 mg | ORAL_TABLET | Freq: Every day | ORAL | Status: DC
  Administered 2024-03-16: 50 mg via ORAL
  Filled 2024-03-16: qty 1

## 2024-03-16 MED ORDER — SACUBITRIL-VALSARTAN 24-26 MG PO TABS: 1.0000 | ORAL_TABLET | Freq: Two times a day (BID) | ORAL | 2 refills | Status: AC

## 2024-03-16 MED ORDER — SACUBITRIL-VALSARTAN 24-26 MG PO TABS
1.0000 | ORAL_TABLET | Freq: Two times a day (BID) | ORAL | Status: DC
  Administered 2024-03-16: 1 via ORAL
  Filled 2024-03-16: qty 1

## 2024-03-16 MED ORDER — METOPROLOL SUCCINATE ER 50 MG PO TB24: 50.0000 mg | ORAL_TABLET | Freq: Every day | ORAL | 2 refills | Status: AC

## 2024-03-16 MED ORDER — APIXABAN 5 MG PO TABS: 5.0000 mg | ORAL_TABLET | Freq: Two times a day (BID) | ORAL | 2 refills | Status: AC

## 2024-03-16 MED ORDER — AMIODARONE HCL 200 MG PO TABS: ORAL_TABLET | ORAL | 0 refills | Status: AC

## 2024-03-16 MED ORDER — POTASSIUM CHLORIDE CRYS ER 20 MEQ PO TBCR
40.0000 meq | EXTENDED_RELEASE_TABLET | Freq: Four times a day (QID) | ORAL | Status: DC
  Filled 2024-03-16: qty 2

## 2024-03-16 MED ORDER — FUROSEMIDE 20 MG PO TABS: 20.0000 mg | ORAL_TABLET | Freq: Every day | ORAL | 2 refills | Status: AC

## 2024-03-16 MED ORDER — POTASSIUM CHLORIDE 20 MEQ PO PACK
40.0000 meq | PACK | Freq: Once | ORAL | Status: AC
  Administered 2024-03-16: 40 meq via ORAL
  Filled 2024-03-16: qty 2

## 2024-03-16 NOTE — Discharge Summary (Signed)
 Physician Discharge Summary   Joann Warren  female DOB: 01/04/1946  FMW:[id number]  PCP: Liana Fish, NP  Admit date: 03/14/2024 Discharge date: 03/16/2024  Admitted From: home Disposition:  home Family updated at bedside prior to discharge. CODE STATUS: DNR  Discharge Instructions     Amb referral to AFIB Clinic   Complete by: As directed    Diet - low sodium heart healthy   Complete by: As directed       Hospital Course:  For full details, please see H&P, progress notes, consult notes and ancillary notes.  Briefly,  Joann Warren is a 78 y.o. female with medical history significant of heart failure with reduced EF, hypertension, CKD 3B, who presented from cardiology clinic complaining of dyspnea on exertion, dizziness.    In cardiology office she was found to be in atrial fibrillation RVR, heart rate 145, T wave inversion seen in V5 and V6.  She was sent to the ER for further evaluation.  On arrival to the ED patient received IV diltiazem  which initially improved her heart rate however RVR recurred.  Patient was started on diltiazem  with minimal improvement.  She was then started on amiodarone  with minimal improvement.  She underwent DCCV on 7/29 which was successful.    Atrial fibrillation, RVR - New diagnosis for this patient - Failed initial diltiazem  and amiodarone  - Underwent cardioversion on 7/29 with success. --cont on oral amiodarone  with loading dose. --increase home Toprol  from 25 to 50 mg daily --started on Eliquis .  Heart failure with reduced EF, acute exacerbation - Echo this admission: EF 20%, global hypokinesis, mild MR, moderate TR, elevated PASP. - Nuclear stress February 2025: Hypokinesis of global wall, small perfusion abnormality of mild intensity in anterior region.  No clear evidence of ischemia.  Given absence of anginal symptoms plan was to proceed with medical management. - Patient was previously on Jardiance which was discontinued  due to weight loss --received IV lasix  40 x4 doses, discharged on oral lasix  20 mg daily --Started on Entresto   --D/c'ed losartan  and amlodipine    Lung nodule - Incidentally seen on CT.  4 mm nodule in right upper lung no follow-up needed if low risk patient.  PCP follow-up.   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  Hyperlipidemia - Continue statin   Unless noted above, medications under STOP list are ones pt was not taking PTA.  Discharge Diagnoses:  Principal Problem:   Atrial fibrillation, rapid [facility]) Active Problems:   Atrial fibrillation with RVR (HCC)   30 Day Unplanned Readmission Risk Score    Flowsheet Row ED to Hosp-Admission (Current) from 03/14/2024 in [facility] PCU  30 Day Unplanned Readmission Risk Score (%) 12.73 Filed at 03/16/2024 0801    This score is the patient's risk of an unplanned readmission within 30 days of being discharged (0 -100%). The score is based on dignosis, age, lab data, medications, orders, and past utilization.   Low:  0-14.9   Medium: 15-21.9   High: 22-29.9   Extreme: 30 and above         Discharge Instructions:  Allergies as of 03/16/2024       Reactions   Neomycin-bacitracin Zn-polymyx         Medication List     STOP taking these medications    amLODipine  5 MG tablet Commonly known as: NORVASC    bimatoprost 0.01 % Soln Commonly known as: LUMIGAN   fluorouracil  5 %  cream Commonly known as: EFUDEX    losartan  25 MG tablet Commonly known as: COZAAR    mupirocin  ointment 2 % Commonly known as: BACTROBAN        TAKE these medications    alendronate  70 MG tablet Commonly known as: FOSAMAX  TAKE 1 TABLET EVERY 7 DAYS WITH A FULL GLASS OF WATER ON AN EMPTY STOMACH DO NOT LIE DOWN FOR AT LEAST 30 MIN   amiodarone  200 MG tablet Commonly known as: PACERONE  Take 2 tablets (400 mg total) by mouth 2 (two) times daily for 9 days, THEN 1 tablet (200 mg  total) daily. Start taking on: March 16, 2024   apixaban  5 MG Tabs tablet Commonly known as: ELIQUIS  Take 1 tablet (5 mg total) by mouth 2 (two) times daily.   furosemide  20 MG tablet Commonly known as: LASIX  Take 1 tablet (20 mg total) by mouth daily.   hydrOXYzine  10 MG tablet Commonly known as: ATARAX  TAKE ONE TABLET ONCE OR TWICE A DAY IF NEEDED FOR ITCH   metoprolol  succinate 50 MG 24 hr tablet Commonly known as: TOPROL -XL Take 1 tablet (50 mg total) by mouth daily. Increased from 25 mg. What changed:  medication strength how much to take additional instructions   Prevagen 10 MG Caps Generic drug: Apoaequorin Take 10 mg by mouth daily.   sacubitril -valsartan  24-26 MG Commonly known as: ENTRESTO  Take 1 tablet by mouth 2 (two) times daily.   simvastatin  20 MG tablet Commonly known as: ZOCOR  Take 1 tablet (20 mg total) by mouth at bedtime.         Follow-up Information     Launie Maiden, Ronal Maxwell, NP. Go in 1 week(s).   Specialty: Nurse Practitioner Contact information: [address] [place] [address] [phone]                 Allergies  Allergen Reactions   Neomycin-Bacitracin Zn-Polymyx      The results of significant diagnostics from this hospitalization (including imaging, microbiology, ancillary and laboratory) are listed below for reference.   Consultations:   Procedures/Studies: ECHO TEE Result Date: 03/15/2024    TRANSESOPHOGEAL ECHO REPORT   Patient Name:   Joann Warren Date of Exam: 03/15/2024 Medical Rec #:  [id number]        Height:       62.0 in Accession #:    [id number]       Weight:       106.0 lb Date of Birth:  1945/11/13        BSA:          1.460 m Patient Age:    78 years         BP:           136/99 mmHg Patient Gender: F                HR:           145 bpm. Exam Location:  [facility] Procedure: Transesophageal Echo, Cardiac Doppler and Color Doppler (Both            Spectral and Color Flow Doppler were  utilized during procedure). Indications:     Atrial Fibrillation  History:         Patient has prior history of Echocardiogram examinations, most                  recent 03/15/2024. Risk Factors:Hypertension and Dyslipidemia.  Sonographer:     Christopher Furnace Referring Phys:  [id number] GABRIELLA DECOSTE  Diagnosing Phys: Keller Paterson PROCEDURE: After discussion of the risks and benefits of a TEE, an informed consent was obtained from the patient. The transesophogeal probe was passed without difficulty through the esophogus of the patient. Sedation performed by different physician. The patient was monitored while under deep sedation. Image quality was excellent. The patient's vital signs; including heart rate, blood pressure, and oxygen saturation; remained stable throughout the procedure. The patient developed no complications during the procedure. A successful direct current cardioversion was performed at 200 joules with 1 attempt.  IMPRESSIONS  1. Left ventricular ejection fraction, by estimation, is 15-20%. The left ventricle has severely decreased function.  2. Right ventricular systolic function is normal. The right ventricular size is normal.  3. Left atrial size was severely dilated. No left atrial/left atrial appendage thrombus was detected.  4. Right atrial size was mildly dilated.  5. The mitral valve is normal in structure. Mild to moderate mitral valve regurgitation.  6. Tricuspid valve regurgitation is moderate.  7. The aortic valve is tricuspid. Aortic valve regurgitation is not visualized. Conclusion(s)/Recommendation(s): No LA/LAA thrombus identified. Successful cardioversion performed with restoration of normal sinus rhythm. FINDINGS  Left Ventricle: Left ventricular ejection fraction, by estimation, is 15-20%. The left ventricle has severely decreased function. The left ventricular internal cavity size was normal in size. Right Ventricle: The right ventricular size is normal. No increase in right  ventricular wall thickness. Right ventricular systolic function is normal. Left Atrium: Left atrial size was severely dilated. No left atrial/left atrial appendage thrombus was detected. Right Atrium: Right atrial size was mildly dilated. Pericardium: There is no evidence of pericardial effusion. Mitral Valve: The mitral valve is normal in structure. Mild to moderate mitral valve regurgitation. Tricuspid Valve: The tricuspid valve is normal in structure. Tricuspid valve regurgitation is moderate. Aortic Valve: The aortic valve is tricuspid. Aortic valve regurgitation is not visualized. Pulmonic Valve: The pulmonic valve was normal in structure. Pulmonic valve regurgitation is trivial. Aorta: The aortic root is normal in size and structure. IAS/Shunts: No atrial level shunt detected by color flow Doppler. Keller Paterson Electronically signed by Keller Paterson Signature Date/Time: 03/15/2024/7:12:13 PM    Final    ECHOCARDIOGRAM COMPLETE Result Date: 03/15/2024    ECHOCARDIOGRAM REPORT   Patient Name:   Joann Warren Date of Exam: 03/15/2024 Medical Rec #:  [id number]        Height:       62.0 in Accession #:    [id number]       Weight:       106.0 lb Date of Birth:  1945-09-02        BSA:          1.460 m Patient Age:    78 years         BP:           127/71 mmHg Patient Gender: F                HR:           117 bpm. Exam Location:  [facility] Procedure: 2D Echo, Cardiac Doppler and Color Doppler (Both Spectral and Color            Flow Doppler were utilized during procedure). STAT ECHO Indications:     Atrial Fibrillation I48.91  History:         Patient has no prior history of Echocardiogram examinations.  Risk Factors:Hypertension and Dyslipidemia.  Sonographer:     Christopher Furnace Referring Phys:  [id number] LORANE POLAND Diagnosing Phys: Keller Paterson  Sonographer Comments: Suboptimal apical window. Image acquisition challenging due to patient body habitus. IMPRESSIONS  1. Left ventricular ejection  fraction, by estimation, is 20 to 25%. The left ventricle has severely decreased function. The left ventricle demonstrates global hypokinesis. There is mild left ventricular hypertrophy. Left ventricular diastolic parameters  are indeterminate.  2. Right ventricular systolic function was not well visualized. The right ventricular size is not well visualized. There is mildly elevated pulmonary artery systolic pressure. The estimated right ventricular systolic pressure is 42.1 mmHg.  3. Left atrial size was moderately dilated.  4. Right atrial size was moderately dilated.  5. Moderate pleural effusion.  6. The mitral valve is normal in structure. Mild to moderate mitral valve regurgitation.  7. Tricuspid valve regurgitation is moderate.  8. The aortic valve is tricuspid. There is mild thickening of the aortic valve. Aortic valve regurgitation is trivial.  9. The inferior vena cava is normal in size with <50% respiratory variability, suggesting right atrial pressure of 8 mmHg. FINDINGS  Left Ventricle: Left ventricular ejection fraction, by estimation, is 20 to 25%. The left ventricle has severely decreased function. The left ventricle demonstrates global hypokinesis. The left ventricular internal cavity size was normal in size. There is mild left ventricular hypertrophy. Left ventricular diastolic parameters are indeterminate. Right Ventricle: The right ventricular size is not well visualized. Right vetricular wall thickness was not well visualized. Right ventricular systolic function was not well visualized. There is mildly elevated pulmonary artery systolic pressure. The tricuspid regurgitant velocity is 2.92 m/s, and with an assumed right atrial pressure of 8 mmHg, the estimated right ventricular systolic pressure is 42.1 mmHg. Left Atrium: Left atrial size was moderately dilated. Right Atrium: Right atrial size was moderately dilated. Pericardium: There is no evidence of pericardial effusion. Mitral Valve: The  mitral valve is normal in structure. Mild to moderate mitral valve regurgitation. Tricuspid Valve: The tricuspid valve is normal in structure. Tricuspid valve regurgitation is moderate. Aortic Valve: The aortic valve is tricuspid. There is mild thickening of the aortic valve. Aortic valve regurgitation is trivial. Aortic valve mean gradient measures 2.0 mmHg. Aortic valve peak gradient measures 3.4 mmHg. Aortic valve area, by VTI measures 1.70 cm. Pulmonic Valve: The pulmonic valve was not well visualized. Pulmonic valve regurgitation is trivial. Aorta: The aortic root is normal in size and structure. Venous: The inferior vena cava is normal in size with less than 50% respiratory variability, suggesting right atrial pressure of 8 mmHg. IAS/Shunts: The atrial septum is grossly normal. Additional Comments: There is a moderate pleural effusion.  LEFT VENTRICLE PLAX 2D LVIDd:         4.30 cm LVIDs:         4.00 cm LV PW:         1.00 cm LV IVS:        1.10 cm LVOT diam:     2.00 cm LV SV:         22 LV SV Index:   15 LVOT Area:     3.14 cm  LEFT ATRIUM             Index        RIGHT ATRIUM           Index LA diam:        4.80 cm 3.29 cm/m   RA Area:  20.70 cm LA Vol (A2C):   55.3 ml 37.89 ml/m  RA Volume:   61.40 ml  42.07 ml/m LA Vol (A4C):   62.9 ml 43.09 ml/m LA Biplane Vol: 60.5 ml 41.45 ml/m  AORTIC VALVE                    PULMONIC VALVE AV Area (Vmax):    1.62 cm     PR End Diast Vel: 8.18 msec AV Area (Vmean):   1.43 cm AV Area (VTI):     1.70 cm AV Vmax:           91.60 cm/s AV Vmean:          65.600 cm/s AV VTI:            0.132 m AV Peak Grad:      3.4 mmHg AV Mean Grad:      2.0 mmHg LVOT Vmax:         47.10 cm/s LVOT Vmean:        29.800 cm/s LVOT VTI:          0.072 m LVOT/AV VTI ratio: 0.54  AORTA Ao Root diam: 3.10 cm MITRAL VALVE                  TRICUSPID VALVE MV Area (PHT): 6.12 cm       TR Peak grad:   34.1 mmHg MV Decel Time: 124 msec       TR Vmax:        292.00 cm/s MR Peak grad:     49.8 mmHg MR Mean grad:    31.0 mmHg    SHUNTS MR Vmax:         353.00 cm/s  Systemic VTI:  0.07 m MR Vmean:        259.0 cm/s   Systemic Diam: 2.00 cm MR PISA:         1.01 cm MR PISA Eff ROA: 11 mm MR PISA Radius:  0.40 cm MV E velocity: 95.40 cm/s Keller Paterson Electronically signed by Keller Paterson Signature Date/Time: 03/15/2024/11:17:07 AM    Final    CT Angio Chest PE W and/or Wo Contrast Result Date: 03/14/2024 CLINICAL DATA:  New onset atrial fibrillation for 3 days. Elevated D-dimer. Lightheadedness and weakness with exertion. Palpitations. EXAM: CT ANGIOGRAPHY CHEST WITH CONTRAST TECHNIQUE: Multidetector CT imaging of the chest was performed using the standard protocol during bolus administration of intravenous contrast. Multiplanar CT image reconstructions and MIPs were obtained to evaluate the vascular anatomy. RADIATION DOSE REDUCTION: This exam was performed according to the departmental dose-optimization program which includes automated exposure control, adjustment of the mA and/or kV according to patient size and/or use of iterative reconstruction technique. CONTRAST:  60mL OMNIPAQUE  IOHEXOL  350 MG/ML SOLN COMPARISON:  Chest radiograph 03/14/2024 FINDINGS: Cardiovascular: Technically adequate study with good opacification of the central and segmental pulmonary arteries. Mild motion artifact. No focal filling defects are demonstrated. No evidence of significant pulmonary embolus. Cardiac enlargement. No pericardial effusions. Normal caliber thoracic aorta. Calcification in the aorta and coronary arteries. Mediastinum/Nodes: Thyroid  gland is unremarkable. Esophagus is decompressed. No significant lymphadenopathy. Lungs/Pleura: Moderate bilateral pleural effusions with bilateral perihilar and basilar atelectasis or consolidation. This may represent compressive atelectasis, pneumonia, edema, aspiration, or combination. No pneumothorax. 4 mm nodule in the right upper lung, series 5, image 46.  Upper Abdomen: No acute abnormality. Musculoskeletal: Slight anterior subluxations at C6-7 and C7-T1 levels. In the absence of known trauma, this is likely  degenerative or chronic. Otherwise normal alignment of the thoracic spine. Degenerative changes in the spine. No vertebral compression deformities. Review of the MIP images confirms the above findings. IMPRESSION: 1. No evidence of significant pulmonary embolus. 2. Mild aortic atherosclerosis. 3. Cardiac enlargement. 4. Moderate bilateral pleural effusions with bilateral perihilar and basilar infiltration/atelectasis. 5. 4 mm nodule in the right upper lung. No follow-up needed if patient is low-risk.This recommendation follows the consensus statement: Guidelines for Management of Incidental Pulmonary Nodules Detected on CT Images: From the Fleischner Society 2017; Radiology 2017; 284:228-243. Electronically Signed   By: Elsie Gravely M.D.   On: 03/14/2024 16:08   DG Chest Port 1 View Result Date: 03/14/2024 CLINICAL DATA:  Chest pain.  Tachycardia. EXAM: PORTABLE CHEST 1 VIEW COMPARISON:  None Available. FINDINGS: Bilateral small pleural effusions noted with probable associated underlying compressive atelectatic changes. No pneumothorax on either side. Bilateral lung fields are otherwise clear. Normal cardio-mediastinal silhouette. No acute osseous abnormalities. The soft tissues are within normal limits. IMPRESSION: Bilateral small pleural effusions with probable underlying compressive atelectatic changes. Electronically Signed   By: Ree Molt M.D.   On: 03/14/2024 13:02      Labs: BNP (last 3 results) Recent Labs    03/14/24 1230  BNP 1,470.6*   Basic Metabolic Panel: Recent Labs  Lab 03/14/24 1230 03/14/24 1339 03/15/24 1040 03/15/24 1427 03/16/24 0409  NA 140  --  138 138 137  K 4.2  --  4.0 4.3 3.2*  CL 103  --  104 101 100  CO2 21*  --  21* 23 25  GLUCOSE 124*  --  123* 106* 102*  BUN 24*  --  26* 25* 24*  CREATININE  1.36*  --  1.37* 1.38* 1.44*  CALCIUM 9.0  --  8.4* 8.6* 8.4*  MG  --  1.9  --   --   --    Liver Function Tests: Recent Labs  Lab 03/14/24 1339  AST 90*  ALT 67*  ALKPHOS 93  BILITOT 0.8  PROT 6.5  ALBUMIN 3.5   Recent Labs  Lab 03/14/24 1339  LIPASE 33   No results for input(s): AMMONIA in the last 168 hours. CBC: Recent Labs  Lab 03/14/24 1230 03/16/24 0409  WBC 8.8 8.2  HGB 12.0 11.6*  HCT 39.0 36.4  MCV 92.2 87.7  PLT 263 230   Cardiac Enzymes: No results for input(s): CKTOTAL, CKMB, CKMBINDEX, TROPONINI in the last 168 hours. BNP: Invalid input(s): POCBNP CBG: No results for input(s): GLUCAP in the last 168 hours. D-Dimer Recent Labs    03/14/24 1339  DDIMER 1.71*   Hgb A1c No results for input(s): HGBA1C in the last 72 hours. Lipid Profile No results for input(s): CHOL, HDL, LDLCALC, TRIG, CHOLHDL, LDLDIRECT in the last 72 hours. Thyroid  function studies Recent Labs    03/14/24 1339  TSH 3.548   Anemia work up No results for input(s): VITAMINB12, FOLATE, FERRITIN, TIBC, IRON, RETICCTPCT in the last 72 hours. Urinalysis    Component Value Date/Time   APPEARANCEUR Clear 05/06/2023 1556   GLUCOSEU Negative 05/06/2023 1556   BILIRUBINUR Negative 05/06/2023 1556   PROTEINUR Negative 05/06/2023 1556   NITRITE Negative 05/06/2023 1556   LEUKOCYTESUR Trace (A) 05/06/2023 1556   Sepsis Labs Recent Labs  Lab 03/14/24 1230 03/16/24 0409  WBC 8.8 8.2   Microbiology No results found for this or any previous visit (from the past 240 hours).   Total time spend on discharging this patient, including the  last patient exam, discussing the hospital stay, instructions for ongoing care as it relates to all pertinent caregivers, as well as preparing the medical discharge records, prescriptions, and/or referrals as applicable, is 35 minutes.    Ellouise Haber, MD  [facility] 03/16/2024, 9:46 AM

## 2024-03-16 NOTE — Progress Notes (Signed)
 Heart Failure Stewardship Pharmacy Note  PCP: Liana Fish, NP PCP-Cardiologist: None  HPI: Joann Warren is a 78 y.o. female with CHF, HTN, CKD, and recently diagnosed atrial fibrillation RVR in her PCP office and sent to the ED with a chief complain of dyspnea on exertion and dizziness. On admission, BNP was 1470.6, HS-troponin was 41, TSH 3.548, AST 90, ALT 67, and T4 was 1.21. Chest x-ray noted small bilateral pleural effusions. CTA negative for PE, but positive for moderate bilateral pleural effusions.   Pertinent cardiac history: TTE 08/2023 with LVEF 35%, mild AR, moderate MR, mild PR, and severe TR. TTE 12/2023 with LVEF of 25%, G2DD, mild AR, moderate MR, mild PR, moderate TR. TTE this admission with LVEF 20-25%, mild to moderate MR, moderate TR, and moderate pleural effusion.  Pertinent Lab Values: Creatinine, Ser  Date Value Ref Range Status  03/16/2024 1.44 (H) 0.44 - 1.00 mg/dL Final   BUN  Date Value Ref Range Status  03/16/2024 24 (H) 8 - 23 mg/dL Final  93/82/7974 12 8 - 27 mg/dL Final   Potassium  Date Value Ref Range Status  03/16/2024 3.2 (L) 3.5 - 5.1 mmol/L Final   Sodium  Date Value Ref Range Status  03/16/2024 137 135 - 145 mmol/L Final  02/02/2024 143 134 - 144 mmol/L Final   B Natriuretic Peptide  Date Value Ref Range Status  03/14/2024 1,470.6 (H) 0.0 - 100.0 pg/mL Final    Comment:    Performed at [facility], [address] [place], [place] [address]   Magnesium  Date Value Ref Range Status  03/14/2024 1.9 1.7 - 2.4 mg/dL Final    Comment:    HEMOLYSIS AT THIS LEVEL MAY AFFECT RESULT Performed at [facility], [address] [place], [place] [address]    Hemoglobin A1C  Date Value Ref Range Status  07/20/2023 5.4 4.0 - 5.6 % Final   TSH  Date Value Ref Range Status  03/14/2024 3.548 0.350 - 4.500 uIU/mL Final    Comment:    Performed by a 3rd Generation assay with a functional sensitivity of <=0.01  uIU/mL. Performed at [facility], [address] [place], [place] [address]   04/30/2022 3.270 0.450 - 4.500 uIU/mL Final    Vital Signs:  Temp:  [97.7 F (36.5 C)-98.3 F (36.8 C)] 97.7 F (36.5 C) (07/30 0808) Pulse Rate:  [85-100] 100 (07/30 0808) Cardiac Rhythm: Normal sinus rhythm (07/30 0702) Resp:  [16-20] 16 (07/30 0400) BP: (116-134)/(73-89) 134/83 (07/30 0808) SpO2:  [97 %-100 %] 99 % (07/30 0808)  Intake/Output Summary (Last 24 hours) at 03/16/2024 1155 Last data filed at 03/16/2024 0605 Gross per 24 hour  Intake 230.82 ml  Output --  Net 230.82 ml    Current Heart Failure Medications:  Loop diuretic: furosemide  40 mg IV q12h Beta-Blocker: none ACEI/ARB/ARNI: none MRA: none SGLT2i: none Other: none  Prior to admission Heart Failure Medications:  Loop diuretic: none Beta-Blocker: metoprolol  succinate 25 mg daily ACEI/ARB/ARNI: losartan  25 mg daily MRA: none SGLT2i: none Other: amlodipine  5 mg daily  Assessment: 1. 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 NYHA class II-III symptoms.  -Symptoms: Reports feeling after DCCV. No SOB or LEE.  -Volume: Appears euvolemic today with improved JVP. No LEE or abdominal edema. Agree with transition to oral furosemide . -Hemodynamics: BP increasing this AM to 130s systolic. HR 90s.  -BB: Metoprolol  succinate 50 mg daily added today. -  ACEI/ARB/ARNI: Entresto  24-26 mg BID started today. -MRA: Consider adding if creatinine improves.  -SGLT2i: No recent UTIs noted. Can consider adding later outpatient.  Plan: 1) Medication changes recommended at this time: -None   2) Patient assistance: -Copays for Eliquis , Entresto , Farxiga, and Jardiance are $47  3) Education: - Patient has been educated on current HF medications and potential additions to HF medication regimen - Patient verbalizes understanding that over the next few months, these medication  doses may change and more medications may be added to optimize HF regimen - Patient has been educated on basic disease state pathophysiology and goals of therapy  Medication Assistance / Insurance Benefits Check: Does the patient have prescription insurance?    Type of insurance plan:  Does the patient qualify for medication assistance through manufacturers or grants? Pending   Outpatient Pharmacy: Prior to admission outpatient pharmacy: [facility]      Please do not hesitate to reach out with questions or concerns,  Jaun Bash, PharmD, CPP, BCPS, [facility] Heart Failure Pharmacist  Phone - [phone] 03/16/2024 11:55 AM

## 2024-03-16 NOTE — Progress Notes (Signed)
 [facility] PROGRESS NOTE       Patient ID: Joann Warren MRN: [id number] DOB/AGE: [birth year] 78 y.o.  Admit date: 03/14/2024 Referring Physician Dr. Leesa Primary Physician Liana Fish, NP Primary Cardiologist Dr. Brook Tinnie Maiden, NP Reason for Consultation AF RVR  HPI: Joann Warren is a 78 y.o. female  with a past medical history of HFrEF (diagnosed 08/2023), hypertension, hyperlipidemia who presented to the ED on 03/14/2024 after being sent from outpatient cardiology due to newfound atrial fibrillation with RVR with rates in the 140s.  Patient states she has had a palpitations, lightheadedness, shortness of breath for the past 4 days. Cardiology was consulted for further evaluation of AF RVR.  Interval History: -Patient seen and examined this AM and laying comfortably in hospital bed. Patient states she feels well and denies chest pain or SOB. Appears near euvolemia.  -Patients BP and HR  stable this AM. Overnight Tele showed no significant events. Remains in SR s/p TEE/DCCV (07/29) -Patient remains on room air with stable SpO2.    Pertinent Cardiac History (Most recent) Cardiac stress test (09/2023) LVEF= 48%   FINDINGS:  Regional wall motion:  demonstrates  hypokinesis of the global walls  borderline hypo .  The overall quality of the study is good.   Artifacts noted: no  Left ventricular cavity: normal.   Review of systems complete and found to be negative unless listed above    Past Medical History:  Diagnosis Date   Actinic keratosis    Anemia    Basal cell carcinoma 12/23/2007   Upper back.   Basal cell carcinoma 05/17/2009   Right med lower leg   Basal cell carcinoma 07/06/2014   Left medial breast   Basal cell carcinoma 03/01/2015   Right medial mid pretibial   Basal cell carcinoma 07/16/2015   Right superior breast   Basal cell carcinoma 05/21/2016   Left inferior medial breast   Basal cell carcinoma 02/08/2018   Left  medial breast   Basal cell carcinoma 11/01/2018   Left medial breast   Basal cell carcinoma 03/30/2019   Left med inf breast   Basal cell carcinoma 09/05/2019   Left proximal bicep   Basal cell carcinoma 12/08/2019   Right forehead. Nodular pattern.   Basal cell carcinoma 12/13/2019   Left med. breast inf. Nodular and infiltrative patterns. EDC   Basal cell carcinoma 12/13/2019   Left med. breast. sup. Nodular pattern. EDC   Basal cell carcinoma 05/14/2020   Right upper back. BCC with focal sclerosis. EDC.   Basal cell carcinoma 12/25/2020   Left inf med breast EDC   Basal cell carcinoma 07/03/2021   Right bicep EDC   Basal cell carcinoma 06/10/2022   left inferior medial breast   Cancer (HCC)    squamous cell   Glaucoma    Hyperlipidemia    Hypertension    Osteoporosis    SCC (squamous cell carcinoma) 12/16/2021   R forearm, EDC   SCC (squamous cell carcinoma) 12/16/2021   L pretibial, EDC   SCC (squamous cell carcinoma) 04/23/2022   Left mid distal pretibial lat - EDC   SCC (squamous cell carcinoma) 04/23/2022   Left mid distal pretibial med - EDC   SCC (squamous cell carcinoma) 08/28/2022   L distal lat pretibial   SCC (squamous cell carcinoma) 08/28/2022   L proximal pretibial   SCC (squamous cell carcinoma) 12/31/2022   R lower leg anterior, EDC   SCC (squamous cell carcinoma) 12/31/2022  L lower leg anterior, EDC   SCC (squamous cell carcinoma) 12/31/2022   L chest superior, EDC   SCC (squamous cell carcinoma) 12/31/2022   L chest inferior, EDC   SCC (squamous cell carcinoma) 07/02/2023   right middle pretibial sup medial tx EDC   SCC (squamous cell carcinoma) 07/02/2023   right middle pretibial middle tx with ED&C   SCC (squamous cell carcinoma) 07/02/2023   right middle pretibial inferior tx with ED&C   SCC (squamous cell carcinoma) 07/02/2023   right lower leg inferior medial knee tx with ED&C   SCC (squamous cell carcinoma) 12/30/2023   well  differentiated - right lateral lower leg - ED&C done   SCC (squamous cell carcinoma) 12/30/2023   left lateral lower leg proximal - ED&C done   SCC (squamous cell carcinoma) 12/30/2023   left lateral lower leg distal near ankle - ed&C done   Squamous cell carcinoma of skin 12/08/2007   Left lower leg. WD SCC   Squamous cell carcinoma of skin 02/01/2008   Left distal med. pretibial. WD SCC   Squamous cell carcinoma of skin 02/01/2008   Right mid pretibial. WD SCC   Squamous cell carcinoma of skin 02/01/2008   Right mid pretibal inferior. WD SCC with superficial infiltration.   Squamous cell carcinoma of skin 04/05/2008   Left dorsum hand. WD SCC with superficial infiltration.   Squamous cell carcinoma of skin 06/15/2008   Right lower leg inferior. WD SCC   Squamous cell carcinoma of skin 06/15/2008   Right lower leg superior. SCCis hypertrophic   Squamous cell carcinoma of skin 07/05/2008   Right lat. lower leg, Right lower leg, Left lower leg   Squamous cell carcinoma of skin 08/04/2008   Right ant. lower leg, Left med. lower leg   Squamous cell carcinoma of skin 09/01/2008   Right post. lower leg   Squamous cell carcinoma of skin 10/04/2008   Left post leg   Squamous cell carcinoma of skin 12/01/2008   Left lower leg   Squamous cell carcinoma of skin 03/08/2009   Right ant. mid lower leg, Right lower leg   Squamous cell carcinoma of skin 05/17/2009   Left lower post. leg superior   Squamous cell carcinoma of skin 10/11/2009   Left lower leg. KA-pattern   Squamous cell carcinoma of skin 03/28/2010   Right lower leg   Squamous cell carcinoma of skin 03/25/2013   Right lower leg   Squamous cell carcinoma of skin 06/23/2013   Right post. lower leg. Right medial lower leg. Left medial lower leg   Squamous cell carcinoma of skin 09/09/2013   Left anterior upper arm. Right anterior lower leg. Right lat. lower leg. Right below knee lower leg   Squamous cell carcinoma of skin  12/08/2013   Right posterior lower leg sup. Right forearm   Squamous cell carcinoma of skin 03/03/2014   Right posterior leg. Right med. lower leg   Squamous cell carcinoma of skin 06/22/2014   Right to of shoulder anterior. Right top of shoulder posterior   Squamous cell carcinoma of skin 08/03/2014   Right anterior forearm   Squamous cell carcinoma of skin 09/14/2014   Right chest   Squamous cell carcinoma of skin 10/30/2014   Right calf   Squamous cell carcinoma of skin 12/14/2014   Right med. distal pretibial sup. Right med distal pretibial inf. Left proximal bicep sup. Left proximal bicep inf.   Squamous cell carcinoma of skin 03/01/2015   Left lat  proximal pretibial near knee   Squamous cell carcinoma of skin 03/29/2015   Left mid to prox. pretibial. Left mid. lat. pretibial.    Squamous cell carcinoma of skin 04/19/2015   Right mid calf. Right distal lat. pretibial   Squamous cell carcinoma of skin 05/31/2015   Right proximal med. pretibial ant. Right proximal med. pretibial posterior   Squamous cell carcinoma of skin 07/16/2015   Right upper arm inf. deltoid   Squamous cell carcinoma of skin 08/06/2015   Left distal pretibial   Squamous cell carcinoma of skin 09/12/2015   Left postauricular area   Squamous cell carcinoma of skin 12/12/2015   Left proximal pretibial x4 sites   Squamous cell carcinoma of skin 12/31/2015   Right med. lower leg above ankle sup. Right med. lower leg above ankle inf. Left inf. lat. calf med. Left inf. calf lat.   Squamous cell carcinoma of skin 01/31/2016   Right med. sup. ankle area. Right med mid proximal calf. Left dorsum foot.    Squamous cell carcinoma of skin 03/17/2016   Left post. distal calf lateral. Left post distal calf med. Left mid lat ant. thigh. Right mid lat. calf   Squamous cell carcinoma of skin 05/21/2016   Right proximal medial pretibial. Right mid med. pretibial.   Squamous cell carcinoma of skin 07/03/2016   Right mid  med. pretibial. Right proximal med. pretibial   Squamous cell carcinoma of skin 08/20/2016   Right medial calf. Left distal pretibial. Right upper back paraspinal   Squamous cell carcinoma of skin 10/30/2016   Right medial calf x2   Squamous cell carcinoma of skin 12/10/2016   left mid back 6.0cm lat to spine. Left sup. lat. calf. Left inf. post. calf   Squamous cell carcinoma of skin 12/25/2016   Right medial above ankle. Right mid pretibial. Right prox. pretibial. Right prox. pretibial med.    Squamous cell carcinoma of skin 01/19/2017   Left forearm. Left ant. neck   Squamous cell carcinoma of skin 03/23/2017   Left med. pretibial below knee   Squamous cell carcinoma of skin 04/13/2017   Right lat. sup. ankle. Right medial ankle   Squamous cell carcinoma of skin 04/27/2017   Left med. distal prox. calf. Left medial distal calf. Left lat. distal prox. calf. Left lat distal distal calf   Squamous cell carcinoma of skin 05/14/2017   Left bicep. Left dorsum mid forearm   Squamous cell carcinoma of skin 06/25/2017   Right med. distal calf x3   Squamous cell carcinoma of skin 09/02/2017   Left med. infraclavicular   Squamous cell carcinoma of skin 12/21/2017   Left prox. med. lower leg. Right forearm. Left forearm. Left bicep   Squamous cell carcinoma of skin 01/07/2018   Left dorsum foot prox. Left dorsum foot distal. Left mid to distal calf. Right chest   Squamous cell carcinoma of skin 03/08/2018   Left bicep. Right proximal dorsum forearm. Right distal med. pretibial sup. Right distal med pretibial inf.    Squamous cell carcinoma of skin 04/05/2018   Right sup. med scapula   Squamous cell carcinoma of skin 06/03/2018   Left med. ankle sup. Left med. ankle inf. Left lateral ankle.   Squamous cell carcinoma of skin 09/01/2018   Right mid lat. calf. Right lat. knee. Prox post calf   Squamous cell carcinoma of skin 11/01/2018   Left lateral ankle   Squamous cell carcinoma of skin  12/08/2018   Right lateral ankle lat  malleolus   Squamous cell carcinoma of skin 01/05/2019   Right lateral elbow. Left mid medial calf. Left prox. ant. thigh. Right distal lat tricep   Squamous cell carcinoma of skin 01/19/2019   Right popliteal   Squamous cell carcinoma of skin 02/03/2019   Left mid lat calf. Left mid calf. Left dorsum lat distal foot. Left dorsum distal foot   Squamous cell carcinoma of skin 03/30/2019   Right lower leg above med ankle ant. Right lower leg above ankle sup. Right lower leg above the med ankle inf.   Squamous cell carcinoma of skin 05/12/2019   Right chest medial. Left deltoid   Squamous cell carcinoma of skin 06/23/2019   Right ant thigh distal above knee. Left med distal thigh. Left ant thigh above knee   Squamous cell carcinoma of skin 07/20/2019   Right chest. Right dorsal hand.   Squamous cell carcinoma of skin 08/03/2019   Left distal lat pretibial. Left distal med calf. Right prox. lat calf   Squamous cell carcinoma of skin 09/05/2019   Right middle bicep. Right sup lat bicep. Right sup mid bicep. Right sup med bicep   Squamous cell carcinoma of skin 09/15/2019   Right proximal med pretibial. Left distal lat thigh   Squamous cell carcinoma of skin 04/02/2020   Right calf, bleow right medial knee sup, below right medial knee post, below right medial knee inf   Squamous cell carcinoma of skin 05/14/2020   Right ant. shoulder. Mod. Diff. SCC. EDC   Squamous cell carcinoma of skin 06/27/2020   R mid to distal pretibial - ED&C   Squamous cell carcinoma of skin 06/27/2020   Below the R knee - ED&C    Squamous cell carcinoma of skin 06/27/2020   R prox pretibial    Squamous cell carcinoma of skin 06/27/2020   L distal med calf   Squamous cell carcinoma of skin 08/06/2020   Left ant deltoid   Squamous cell carcinoma of skin 12/25/2020   Left lat ankle EDC   Squamous cell carcinoma of skin 03/27/2021   left knee, EDC   Squamous cell carcinoma  of skin 03/27/2021   left lateral leg, EDC   Squamous cell carcinoma of skin 07/03/2021   Left lat neck - EDC   Squamous cell carcinoma of skin 07/03/2021   Right ant thigh - EDC   Squamous cell carcinoma of skin 07/03/2021   Right prox lat pretibial - EDC   Squamous cell carcinoma of skin 10/16/2021   right med mid calf, EDC   Squamous cell carcinoma of skin 10/16/2021   left medial prox pretibial, EDC   Squamous cell carcinoma of skin 10/16/2021   left distal pretibial, EDC   Squamous cell carcinoma of skin 03/26/2023   Left ant lat ankle EDC   Squamous cell carcinoma of skin 03/26/2023   Left medial lower leg above ankle EDC EDC   Squamous cell carcinoma of skin 03/26/2023   Right lat ankle sup EDC   Squamous cell carcinoma of skin 03/26/2023   Right lat ankle middle EDC   Squamous cell carcinoma of skin 03/26/2023   Right lat ankle inf EDC   Squamous cell carcinoma of skin 10/15/2023   left distal pretibial, EDC   Squamous cell carcinoma of skin 10/15/2023   left proximal pretibial, EDC   Squamous cell carcinoma of skin 10/15/2023   right middle medial lower leg, EDC    Past Surgical History:  Procedure Laterality Date  BREAST EXCISIONAL BIOPSY Left 1972   neg   BREAST EXCISIONAL BIOPSY Right 1980   neg   CARDIOVERSION N/A 03/15/2024   Procedure: CARDIOVERSION;  Surgeon: Alluri, Keller BROCKS, MD;  Location: [facility] ORS;  Service: Cardiovascular;  Laterality: N/A;   COLONOSCOPY     ESOPHAGOGASTRODUODENOSCOPY (EGD) WITH PROPOFOL  N/A 04/09/2015   Procedure: ESOPHAGOGASTRODUODENOSCOPY (EGD) WITH PROPOFOL ;  Surgeon: Lamar ONEIDA Holmes, MD;  Location: [facility] ENDOSCOPY;  Service: Endoscopy;  Laterality: N/A;   skin cancer removal     TEE WITHOUT CARDIOVERSION N/A 03/15/2024   Procedure: ECHOCARDIOGRAM, TRANSESOPHAGEAL;  Surgeon: Alluri, Keller BROCKS, MD;  Location: [facility] ORS;  Service: Cardiovascular;  Laterality: N/A;    Medications Prior to Admission  Medication Sig Dispense Refill Last  Dose/Taking   alendronate  (FOSAMAX ) 70 MG tablet TAKE 1 TABLET EVERY 7 DAYS WITH A FULL GLASS OF WATER ON AN EMPTY STOMACH DO NOT LIE DOWN FOR AT LEAST 30 MIN 12 tablet 4 Past Week   amLODipine  (NORVASC ) 5 MG tablet TAKE 1 TABLET BY MOUTH ONCE DAILY 90 tablet 3 03/14/2024 Morning   hydrOXYzine  (ATARAX ) 10 MG tablet TAKE ONE TABLET ONCE OR TWICE A DAY IF NEEDED FOR ITCH 30 tablet 2 Unknown   losartan  (COZAAR ) 25 MG tablet Take 25 mg by mouth daily.   03/14/2024 Morning   simvastatin  (ZOCOR ) 20 MG tablet Take 1 tablet (20 mg total) by mouth at bedtime. 90 tablet 3 03/13/2024 Evening   [DISCONTINUED] metoprolol  succinate (TOPROL -XL) 25 MG 24 hr tablet Take 25 mg by mouth daily.   03/14/2024 Morning   Apoaequorin (PREVAGEN) 10 MG CAPS Take 10 mg by mouth daily.   Unknown   bimatoprost (LUMIGAN) 0.01 % SOLN Place 1 drop into the right eye at bedtime. (Patient not taking: Reported on 03/14/2024)   Not Taking   fluorouracil  (EFUDEX ) 5 % cream Apply twice daily to affected areas on left foot up to 2 weeks (Patient not taking: Reported on 03/14/2024) 30 g 2 Not Taking   mupirocin  ointment (BACTROBAN ) 2 % Apply 1 Application topically daily. Qd to wounds for a 14 day supply (Patient not taking: Reported on 03/14/2024) 22 g 11 Not Taking   Social History   Socioeconomic History   Marital status: Married    Spouse name: Not on file   Number of children: Not on file   Years of education: Not on file   Highest education level: Not on file  Occupational History   Not on file  Tobacco Use   Smoking status: Never   Smokeless tobacco: Never  Vaping Use   Vaping status: Never Used  Substance and Sexual Activity   Alcohol use: No   Drug use: No   Sexual activity: Not on file  Other Topics Concern   Not on file  Social History Narrative   Not on file   Social Drivers of Health   Financial Resource Strain: Not on file  Food Insecurity: No Food Insecurity (03/15/2024)   Hunger Vital Sign    Worried About  Running Out of Food in the Last Year: Never true    Ran Out of Food in the Last Year: Never true  Transportation Needs: No Transportation Needs (03/15/2024)   PRAPARE - Administrator, Civil Service (Medical): No    Lack of Transportation (Non-Medical): No  Physical Activity: Not on file  Stress: Not on file  Social Connections: Socially Integrated (03/15/2024)   Social Connection and Isolation Panel    Frequency of Communication  with Friends and Family: Three times a week    Frequency of Social Gatherings with Friends and Family: Three times a week    Attends Religious Services: More than 4 times per year    Active Member of Clubs or Organizations: Yes    Attends Banker Meetings: 1 to 4 times per year    Marital Status: Married  Catering manager Violence: Not At Risk (03/15/2024)   Humiliation, Afraid, Rape, and Kick questionnaire    Fear of Current or Ex-Partner: No    Emotionally Abused: No    Physically Abused: No    Sexually Abused: No    Family History  Problem Relation Age of Onset   Breast cancer Sister 90   Atrial fibrillation Mother      Vitals:   03/15/24 1926 03/15/24 2355 03/16/24 0400 03/16/24 0808  BP: 134/82 122/75 123/82 134/83  Pulse: 95 85 95 100  Resp: 18 16 16    Temp: 97.7 F (36.5 C) 98.3 F (36.8 C) 98.1 F (36.7 C) 97.7 F (36.5 C)  TempSrc:  Oral Oral   SpO2: 100% 97% 97% 99%  Weight:      Height:        PHYSICAL EXAM General: Well-appearing elderly female, well nourished, in no acute distress. HEENT: Normocephalic and atraumatic. Neck: No JVD.   Lungs: Normal respiratory effort on room air.  CTAB Heart: HRRR. Normal S1 and S2 without gallops or murmurs.  Abdomen: Non-distended appearing.  Msk: Normal strength and tone for age. Extremities: Warm and well perfused. No clubbing, cyanosis, edema.  Neuro: Alert and oriented X 3. Psych: Answers questions appropriately.   Labs: Basic Metabolic Panel: Recent Labs     03/14/24 1339 03/15/24 1040 03/15/24 1427 03/16/24 0409  NA  --    < > 138 137  K  --    < > 4.3 3.2*  CL  --    < > 101 100  CO2  --    < > 23 25  GLUCOSE  --    < > 106* 102*  BUN  --    < > 25* 24*  CREATININE  --    < > 1.38* 1.44*  CALCIUM  --    < > 8.6* 8.4*  MG 1.9  --   --   --    < > = values in this interval not displayed.   Liver Function Tests: Recent Labs    03/14/24 1339  AST 90*  ALT 67*  ALKPHOS 93  BILITOT 0.8  PROT 6.5  ALBUMIN 3.5   Recent Labs    03/14/24 1339  LIPASE 33   CBC: Recent Labs    03/14/24 1230 03/16/24 0409  WBC 8.8 8.2  HGB 12.0 11.6*  HCT 39.0 36.4  MCV 92.2 87.7  PLT 263 230   Cardiac Enzymes: Recent Labs    03/14/24 1230 03/14/24 1450  TROPONINIHS 41* 22*   BNP: Recent Labs    03/14/24 1230  BNP 1,470.6*   D-Dimer: Recent Labs    03/14/24 1339  DDIMER 1.71*   Hemoglobin A1C: No results for input(s): HGBA1C in the last 72 hours. Fasting Lipid Panel: No results for input(s): CHOL, HDL, LDLCALC, TRIG, CHOLHDL, LDLDIRECT in the last 72 hours. Thyroid  Function Tests: Recent Labs    03/14/24 1339  TSH 3.548   Anemia Panel: No results for input(s): VITAMINB12, FOLATE, FERRITIN, TIBC, IRON, RETICCTPCT in the last 72 hours.   Radiology: ECHO TEE Result Date:  03/15/2024    TRANSESOPHOGEAL ECHO REPORT   Patient Name:   ROIZY HAROLD Date of Exam: 03/15/2024 Medical Rec #:  [id number]        Height:       62.0 in Accession #:    [id number]       Weight:       106.0 lb Date of Birth:  05-29-1946        BSA:          1.460 m Patient Age:    78 years         BP:           136/99 mmHg Patient Gender: F                HR:           145 bpm. Exam Location:  [facility] Procedure: Transesophageal Echo, Cardiac Doppler and Color Doppler (Both            Spectral and Color Flow Doppler were utilized during procedure). Indications:     Atrial Fibrillation  History:         Patient has prior history of  Echocardiogram examinations, most                  recent 03/15/2024. Risk Factors:Hypertension and Dyslipidemia.  Sonographer:     Christopher Furnace Referring Phys:  [id number] Cierra Rothgeb Diagnosing Phys: Keller Paterson PROCEDURE: After discussion of the risks and benefits of a TEE, an informed consent was obtained from the patient. The transesophogeal probe was passed without difficulty through the esophogus of the patient. Sedation performed by different physician. The patient was monitored while under deep sedation. Image quality was excellent. The patient's vital signs; including heart rate, blood pressure, and oxygen saturation; remained stable throughout the procedure. The patient developed no complications during the procedure. A successful direct current cardioversion was performed at 200 joules with 1 attempt.  IMPRESSIONS  1. Left ventricular ejection fraction, by estimation, is 15-20%. The left ventricle has severely decreased function.  2. Right ventricular systolic function is normal. The right ventricular size is normal.  3. Left atrial size was severely dilated. No left atrial/left atrial appendage thrombus was detected.  4. Right atrial size was mildly dilated.  5. The mitral valve is normal in structure. Mild to moderate mitral valve regurgitation.  6. Tricuspid valve regurgitation is moderate.  7. The aortic valve is tricuspid. Aortic valve regurgitation is not visualized. Conclusion(s)/Recommendation(s): No LA/LAA thrombus identified. Successful cardioversion performed with restoration of normal sinus rhythm. FINDINGS  Left Ventricle: Left ventricular ejection fraction, by estimation, is 15-20%. The left ventricle has severely decreased function. The left ventricular internal cavity size was normal in size. Right Ventricle: The right ventricular size is normal. No increase in right ventricular wall thickness. Right ventricular systolic function is normal. Left Atrium: Left atrial size was severely  dilated. No left atrial/left atrial appendage thrombus was detected. Right Atrium: Right atrial size was mildly dilated. Pericardium: There is no evidence of pericardial effusion. Mitral Valve: The mitral valve is normal in structure. Mild to moderate mitral valve regurgitation. Tricuspid Valve: The tricuspid valve is normal in structure. Tricuspid valve regurgitation is moderate. Aortic Valve: The aortic valve is tricuspid. Aortic valve regurgitation is not visualized. Pulmonic Valve: The pulmonic valve was normal in structure. Pulmonic valve regurgitation is trivial. Aorta: The aortic root is normal in size and structure. IAS/Shunts: No atrial level shunt detected by color flow Doppler. Krishna Alluri Electronically  signed by Keller Paterson Signature Date/Time: 03/15/2024/7:12:13 PM    Final    ECHOCARDIOGRAM COMPLETE Result Date: 03/15/2024    ECHOCARDIOGRAM REPORT   Patient Name:   Joann Warren Date of Exam: 03/15/2024 Medical Rec #:  [id number]        Height:       62.0 in Accession #:    [id number]       Weight:       106.0 lb Date of Birth:  March 21, 1946        BSA:          1.460 m Patient Age:    78 years         BP:           127/71 mmHg Patient Gender: F                HR:           117 bpm. Exam Location:  [facility] Procedure: 2D Echo, Cardiac Doppler and Color Doppler (Both Spectral and Color            Flow Doppler were utilized during procedure). STAT ECHO Indications:     Atrial Fibrillation I48.91  History:         Patient has no prior history of Echocardiogram examinations.                  Risk Factors:Hypertension and Dyslipidemia.  Sonographer:     Christopher Furnace Referring Phys:  [id number] LORANE POLAND Diagnosing Phys: Keller Paterson  Sonographer Comments: Suboptimal apical window. Image acquisition challenging due to patient body habitus. IMPRESSIONS  1. Left ventricular ejection fraction, by estimation, is 20 to 25%. The left ventricle has severely decreased function. The left ventricle demonstrates  global hypokinesis. There is mild left ventricular hypertrophy. Left ventricular diastolic parameters  are indeterminate.  2. Right ventricular systolic function was not well visualized. The right ventricular size is not well visualized. There is mildly elevated pulmonary artery systolic pressure. The estimated right ventricular systolic pressure is 42.1 mmHg.  3. Left atrial size was moderately dilated.  4. Right atrial size was moderately dilated.  5. Moderate pleural effusion.  6. The mitral valve is normal in structure. Mild to moderate mitral valve regurgitation.  7. Tricuspid valve regurgitation is moderate.  8. The aortic valve is tricuspid. There is mild thickening of the aortic valve. Aortic valve regurgitation is trivial.  9. The inferior vena cava is normal in size with <50% respiratory variability, suggesting right atrial pressure of 8 mmHg. FINDINGS  Left Ventricle: Left ventricular ejection fraction, by estimation, is 20 to 25%. The left ventricle has severely decreased function. The left ventricle demonstrates global hypokinesis. The left ventricular internal cavity size was normal in size. There is mild left ventricular hypertrophy. Left ventricular diastolic parameters are indeterminate. Right Ventricle: The right ventricular size is not well visualized. Right vetricular wall thickness was not well visualized. Right ventricular systolic function was not well visualized. There is mildly elevated pulmonary artery systolic pressure. The tricuspid regurgitant velocity is 2.92 m/s, and with an assumed right atrial pressure of 8 mmHg, the estimated right ventricular systolic pressure is 42.1 mmHg. Left Atrium: Left atrial size was moderately dilated. Right Atrium: Right atrial size was moderately dilated. Pericardium: There is no evidence of pericardial effusion. Mitral Valve: The mitral valve is normal in structure. Mild to moderate mitral valve regurgitation. Tricuspid Valve: The tricuspid valve is  normal in structure. Tricuspid valve regurgitation is moderate. Aortic  Valve: The aortic valve is tricuspid. There is mild thickening of the aortic valve. Aortic valve regurgitation is trivial. Aortic valve mean gradient measures 2.0 mmHg. Aortic valve peak gradient measures 3.4 mmHg. Aortic valve area, by VTI measures 1.70 cm. Pulmonic Valve: The pulmonic valve was not well visualized. Pulmonic valve regurgitation is trivial. Aorta: The aortic root is normal in size and structure. Venous: The inferior vena cava is normal in size with less than 50% respiratory variability, suggesting right atrial pressure of 8 mmHg. IAS/Shunts: The atrial septum is grossly normal. Additional Comments: There is a moderate pleural effusion.  LEFT VENTRICLE PLAX 2D LVIDd:         4.30 cm LVIDs:         4.00 cm LV PW:         1.00 cm LV IVS:        1.10 cm LVOT diam:     2.00 cm LV SV:         22 LV SV Index:   15 LVOT Area:     3.14 cm  LEFT ATRIUM             Index        RIGHT ATRIUM           Index LA diam:        4.80 cm 3.29 cm/m   RA Area:     20.70 cm LA Vol (A2C):   55.3 ml 37.89 ml/m  RA Volume:   61.40 ml  42.07 ml/m LA Vol (A4C):   62.9 ml 43.09 ml/m LA Biplane Vol: 60.5 ml 41.45 ml/m  AORTIC VALVE                    PULMONIC VALVE AV Area (Vmax):    1.62 cm     PR End Diast Vel: 8.18 msec AV Area (Vmean):   1.43 cm AV Area (VTI):     1.70 cm AV Vmax:           91.60 cm/s AV Vmean:          65.600 cm/s AV VTI:            0.132 m AV Peak Grad:      3.4 mmHg AV Mean Grad:      2.0 mmHg LVOT Vmax:         47.10 cm/s LVOT Vmean:        29.800 cm/s LVOT VTI:          0.072 m LVOT/AV VTI ratio: 0.54  AORTA Ao Root diam: 3.10 cm MITRAL VALVE                  TRICUSPID VALVE MV Area (PHT): 6.12 cm       TR Peak grad:   34.1 mmHg MV Decel Time: 124 msec       TR Vmax:        292.00 cm/s MR Peak grad:    49.8 mmHg MR Mean grad:    31.0 mmHg    SHUNTS MR Vmax:         353.00 cm/s  Systemic VTI:  0.07 m MR Vmean:         259.0 cm/s   Systemic Diam: 2.00 cm MR PISA:         1.01 cm MR PISA Eff ROA: 11 mm MR PISA Radius:  0.40 cm MV E velocity: 95.40 cm/s Keller Paterson Electronically signed by Keller Paterson Signature  Date/Time: 03/15/2024/11:17:07 AM    Final    CT Angio Chest PE W and/or Wo Contrast Result Date: 03/14/2024 CLINICAL DATA:  New onset atrial fibrillation for 3 days. Elevated D-dimer. Lightheadedness and weakness with exertion. Palpitations. EXAM: CT ANGIOGRAPHY CHEST WITH CONTRAST TECHNIQUE: Multidetector CT imaging of the chest was performed using the standard protocol during bolus administration of intravenous contrast. Multiplanar CT image reconstructions and MIPs were obtained to evaluate the vascular anatomy. RADIATION DOSE REDUCTION: This exam was performed according to the departmental dose-optimization program which includes automated exposure control, adjustment of the mA and/or kV according to patient size and/or use of iterative reconstruction technique. CONTRAST:  60mL OMNIPAQUE  IOHEXOL  350 MG/ML SOLN COMPARISON:  Chest radiograph 03/14/2024 FINDINGS: Cardiovascular: Technically adequate study with good opacification of the central and segmental pulmonary arteries. Mild motion artifact. No focal filling defects are demonstrated. No evidence of significant pulmonary embolus. Cardiac enlargement. No pericardial effusions. Normal caliber thoracic aorta. Calcification in the aorta and coronary arteries. Mediastinum/Nodes: Thyroid  gland is unremarkable. Esophagus is decompressed. No significant lymphadenopathy. Lungs/Pleura: Moderate bilateral pleural effusions with bilateral perihilar and basilar atelectasis or consolidation. This may represent compressive atelectasis, pneumonia, edema, aspiration, or combination. No pneumothorax. 4 mm nodule in the right upper lung, series 5, image 46. Upper Abdomen: No acute abnormality. Musculoskeletal: Slight anterior subluxations at C6-7 and C7-T1 levels. In the  absence of known trauma, this is likely degenerative or chronic. Otherwise normal alignment of the thoracic spine. Degenerative changes in the spine. No vertebral compression deformities. Review of the MIP images confirms the above findings. IMPRESSION: 1. No evidence of significant pulmonary embolus. 2. Mild aortic atherosclerosis. 3. Cardiac enlargement. 4. Moderate bilateral pleural effusions with bilateral perihilar and basilar infiltration/atelectasis. 5. 4 mm nodule in the right upper lung. No follow-up needed if patient is low-risk.This recommendation follows the consensus statement: Guidelines for Management of Incidental Pulmonary Nodules Detected on CT Images: From the Fleischner Society 2017; Radiology 2017; 284:228-243. Electronically Signed   By: Elsie Gravely M.D.   On: 03/14/2024 16:08   DG Chest Port 1 View Result Date: 03/14/2024 CLINICAL DATA:  Chest pain.  Tachycardia. EXAM: PORTABLE CHEST 1 VIEW COMPARISON:  None Available. FINDINGS: Bilateral small pleural effusions noted with probable associated underlying compressive atelectatic changes. No pneumothorax on either side. Bilateral lung fields are otherwise clear. Normal cardio-mediastinal silhouette. No acute osseous abnormalities. The soft tissues are within normal limits. IMPRESSION: Bilateral small pleural effusions with probable underlying compressive atelectatic changes. Electronically Signed   By: Ree Molt M.D.   On: 03/14/2024 13:02    ECHO as above (EF 20-25%)  01/05/24: EF 25%  08/25/2023: EF 30%  TELEMETRY reviewed by me 03/16/2024: SR, rate 100s  EKG reviewed by me: Atrial fibrillation with RVR, rate 170 bpm  Data reviewed by me 03/16/2024: last 24h vitals tele labs imaging I/O hospitalist progress notes.  Principal Problem:   Atrial fibrillation, rapid (HCC) Active Problems:   Atrial fibrillation with RVR (HCC)    ASSESSMENT AND PLAN:  JYRA LAGARES is a 78 y.o. female  with a past medical history  of HFrEF (diagnosed 08/2023), hypertension, hyperlipidemia who presented to the ED on 03/14/2024 after being sent from outpatient cardiology due to newfound atrial fibrillation with RVR with rates in the 140s.  Patient states she has had a palpitations, lightheadedness, shortness of breath for the past 4 days. Cardiology was consulted for further evaluation of AF RVR.  # New atrial fibrillation RVR  EKG in ED with  A-fib RVR, rate 170 bpm per telemetry patient remains in A-fib RVR with rates 130-140s this AM.  Patient underwent TEE/DCCV with Dr. Wilburn (07/29) and successfully converted to sinus rhythm with PACs, rate 80s. Per tele has remains in SR. -Discontinue IV amio gtt.  -Continue PO amio 400 mg BID for 10 days, then 200 mg daily.  -Continue PO Eliquis  5 mg BID for stroke risk reduction with high [id number].  # HFrEF (EF 20%) - recently diagnosed 08/2023 BNP elevated at 1400. CTA with moderate bilateral pleural effusions. Echo this admission with EF 20%, global hypokinesis, mild MR, mod TR, mildly elevated PASP. Echo from 12/2023 with EF 25%, slightly more reduced this admission, likely due to AF RVR. Patient appears euvolemic.  Lactate negative x 2. -Ordered PO lasix  20 mg daily.   -Transitioned from home losartan  to Entresto  24-26 mg BID today. (Entresto  Copay $47) -Orderemetoprolol  succinate 50 mg daily. -Will consider spironolactone if renal function stabilizes at outpatient follow-up. -Will not resume home Jardiance at this time. As patient stopped taking it due to weight loss. (Copay $47).   -Plan to optimize GDMT as renal function and BP allows. -Consider outpatient ischemic eval and outpatient ICD evaluation.   # Hypertension # Hyperlipidemia # Demand ischemia Patient without chest pain. Troponins minimally elevated and flat at 41 >22. EKG without acute ischemic changes.  -Continue home simvastatin  20 mg daily. -Losartan , metoprolol  as stated above. -Minimally elevated and flat  tropes in the setting of A-fib RVR is most consistent with demand/supply mismatch and not ACS.  Ok for discharge today from a cardiac perspective.  Follow-up with Dr. Wilburn on 08/05 at 10 AM and with heart failure clinic on 08/13 at 10 AM.  This patient's plan of care was discussed and created with Dr. Wilburn and he is in agreement.  Signed: Dorene Comfort, PA-C  03/16/2024, 10:06 AM [facility]

## 2024-03-21 ENCOUNTER — Ambulatory Visit (INDEPENDENT_AMBULATORY_CARE_PROVIDER_SITE_OTHER): Admitting: Nurse Practitioner

## 2024-03-21 ENCOUNTER — Encounter: Payer: Self-pay | Admitting: Nurse Practitioner

## 2024-03-21 VITALS — BP 120/74 | HR 69 | Temp 98.2°F | Resp 16 | Ht 62.0 in | Wt 103.4 lb

## 2024-03-21 DIAGNOSIS — I4891 Unspecified atrial fibrillation: Secondary | ICD-10-CM | POA: Diagnosis not present

## 2024-03-21 DIAGNOSIS — Z09 Encounter for follow-up examination after completed treatment for conditions other than malignant neoplasm: Secondary | ICD-10-CM | POA: Diagnosis not present

## 2024-03-21 DIAGNOSIS — I5020 Unspecified systolic (congestive) heart failure: Secondary | ICD-10-CM | POA: Insufficient documentation

## 2024-03-21 DIAGNOSIS — N1832 Chronic kidney disease, stage 3b: Secondary | ICD-10-CM | POA: Insufficient documentation

## 2024-03-21 NOTE — Progress Notes (Signed)
 [facility] [place], [place] [address] [place] [address] [phone]                                   Transitional Care Clinic   [facility] Discharge Acute Issues Care Follow Up                                                                        Patient Demographics  Joann Warren, is a 78 y.o. female  DOB 1946/05/01  MRN [id number].  Primary MD  Liana Fish, NP  Admit date: 03/14/2024 Discharge date: 03/16/2024  Reason for TCC follow Up - AFIB with RVR   Past Medical History:  Diagnosis Date   Actinic keratosis    Anemia    Basal cell carcinoma 12/23/2007   Upper back.   Basal cell carcinoma 05/17/2009   Right med lower leg   Basal cell carcinoma 07/06/2014   Left medial breast   Basal cell carcinoma 03/01/2015   Right medial mid pretibial   Basal cell carcinoma 07/16/2015   Right superior breast   Basal cell carcinoma 05/21/2016   Left inferior medial breast   Basal cell carcinoma 02/08/2018   Left medial breast   Basal cell carcinoma 11/01/2018   Left medial breast   Basal cell carcinoma 03/30/2019   Left med inf breast   Basal cell carcinoma 09/05/2019   Left proximal bicep   Basal cell carcinoma 12/08/2019   Right forehead. Nodular pattern.   Basal cell carcinoma 12/13/2019   Left med. breast inf. Nodular and infiltrative patterns. EDC   Basal cell carcinoma 12/13/2019   Left med. breast. sup. Nodular pattern. EDC   Basal cell carcinoma 05/14/2020   Right upper back. BCC with focal sclerosis. EDC.   Basal cell carcinoma 12/25/2020   Left inf med breast EDC   Basal cell carcinoma 07/03/2021   Right bicep EDC   Basal cell carcinoma 06/10/2022   left inferior medial breast   Cancer (HCC)    squamous cell   Glaucoma    Hyperlipidemia    Hypertension    Osteoporosis    SCC (squamous cell carcinoma) 12/16/2021   R forearm, EDC   SCC (squamous cell carcinoma) 12/16/2021   L pretibial, EDC   SCC (squamous  cell carcinoma) 04/23/2022   Left mid distal pretibial lat - EDC   SCC (squamous cell carcinoma) 04/23/2022   Left mid distal pretibial med - EDC   SCC (squamous cell carcinoma) 08/28/2022   L distal lat pretibial   SCC (squamous cell carcinoma) 08/28/2022   L proximal pretibial   SCC (squamous cell carcinoma) 12/31/2022   R lower leg anterior, EDC   SCC (squamous cell carcinoma) 12/31/2022   L lower leg anterior, EDC   SCC (squamous cell carcinoma) 12/31/2022   L chest superior, EDC   SCC (squamous cell carcinoma) 12/31/2022   L chest inferior, EDC   SCC (squamous cell carcinoma) 07/02/2023   right middle pretibial sup medial tx EDC   SCC (squamous cell carcinoma) 07/02/2023   right middle pretibial middle tx with ED&C   SCC (squamous cell carcinoma) 07/02/2023  right middle pretibial inferior tx with ED&C   SCC (squamous cell carcinoma) 07/02/2023   right lower leg inferior medial knee tx with ED&C   SCC (squamous cell carcinoma) 12/30/2023   well differentiated - right lateral lower leg - ED&C done   SCC (squamous cell carcinoma) 12/30/2023   left lateral lower leg proximal - ED&C done   SCC (squamous cell carcinoma) 12/30/2023   left lateral lower leg distal near ankle - ed&C done   Squamous cell carcinoma of skin 12/08/2007   Left lower leg. WD SCC   Squamous cell carcinoma of skin 02/01/2008   Left distal med. pretibial. WD SCC   Squamous cell carcinoma of skin 02/01/2008   Right mid pretibial. WD SCC   Squamous cell carcinoma of skin 02/01/2008   Right mid pretibal inferior. WD SCC with superficial infiltration.   Squamous cell carcinoma of skin 04/05/2008   Left dorsum hand. WD SCC with superficial infiltration.   Squamous cell carcinoma of skin 06/15/2008   Right lower leg inferior. WD SCC   Squamous cell carcinoma of skin 06/15/2008   Right lower leg superior. SCCis hypertrophic   Squamous cell carcinoma of skin 07/05/2008   Right lat. lower leg, Right lower  leg, Left lower leg   Squamous cell carcinoma of skin 08/04/2008   Right ant. lower leg, Left med. lower leg   Squamous cell carcinoma of skin 09/01/2008   Right post. lower leg   Squamous cell carcinoma of skin 10/04/2008   Left post leg   Squamous cell carcinoma of skin 12/01/2008   Left lower leg   Squamous cell carcinoma of skin 03/08/2009   Right ant. mid lower leg, Right lower leg   Squamous cell carcinoma of skin 05/17/2009   Left lower post. leg superior   Squamous cell carcinoma of skin 10/11/2009   Left lower leg. KA-pattern   Squamous cell carcinoma of skin 03/28/2010   Right lower leg   Squamous cell carcinoma of skin 03/25/2013   Right lower leg   Squamous cell carcinoma of skin 06/23/2013   Right post. lower leg. Right medial lower leg. Left medial lower leg   Squamous cell carcinoma of skin 09/09/2013   Left anterior upper arm. Right anterior lower leg. Right lat. lower leg. Right below knee lower leg   Squamous cell carcinoma of skin 12/08/2013   Right posterior lower leg sup. Right forearm   Squamous cell carcinoma of skin 03/03/2014   Right posterior leg. Right med. lower leg   Squamous cell carcinoma of skin 06/22/2014   Right to of shoulder anterior. Right top of shoulder posterior   Squamous cell carcinoma of skin 08/03/2014   Right anterior forearm   Squamous cell carcinoma of skin 09/14/2014   Right chest   Squamous cell carcinoma of skin 10/30/2014   Right calf   Squamous cell carcinoma of skin 12/14/2014   Right med. distal pretibial sup. Right med distal pretibial inf. Left proximal bicep sup. Left proximal bicep inf.   Squamous cell carcinoma of skin 03/01/2015   Left lat proximal pretibial near knee   Squamous cell carcinoma of skin 03/29/2015   Left mid to prox. pretibial. Left mid. lat. pretibial.    Squamous cell carcinoma of skin 04/19/2015   Right mid calf. Right distal lat. pretibial   Squamous cell carcinoma of skin 05/31/2015   Right  proximal med. pretibial ant. Right proximal med. pretibial posterior   Squamous cell carcinoma of skin 07/16/2015   Right  upper arm inf. deltoid   Squamous cell carcinoma of skin 08/06/2015   Left distal pretibial   Squamous cell carcinoma of skin 09/12/2015   Left postauricular area   Squamous cell carcinoma of skin 12/12/2015   Left proximal pretibial x4 sites   Squamous cell carcinoma of skin 12/31/2015   Right med. lower leg above ankle sup. Right med. lower leg above ankle inf. Left inf. lat. calf med. Left inf. calf lat.   Squamous cell carcinoma of skin 01/31/2016   Right med. sup. ankle area. Right med mid proximal calf. Left dorsum foot.    Squamous cell carcinoma of skin 03/17/2016   Left post. distal calf lateral. Left post distal calf med. Left mid lat ant. thigh. Right mid lat. calf   Squamous cell carcinoma of skin 05/21/2016   Right proximal medial pretibial. Right mid med. pretibial.   Squamous cell carcinoma of skin 07/03/2016   Right mid med. pretibial. Right proximal med. pretibial   Squamous cell carcinoma of skin 08/20/2016   Right medial calf. Left distal pretibial. Right upper back paraspinal   Squamous cell carcinoma of skin 10/30/2016   Right medial calf x2   Squamous cell carcinoma of skin 12/10/2016   left mid back 6.0cm lat to spine. Left sup. lat. calf. Left inf. post. calf   Squamous cell carcinoma of skin 12/25/2016   Right medial above ankle. Right mid pretibial. Right prox. pretibial. Right prox. pretibial med.    Squamous cell carcinoma of skin 01/19/2017   Left forearm. Left ant. neck   Squamous cell carcinoma of skin 03/23/2017   Left med. pretibial below knee   Squamous cell carcinoma of skin 04/13/2017   Right lat. sup. ankle. Right medial ankle   Squamous cell carcinoma of skin 04/27/2017   Left med. distal prox. calf. Left medial distal calf. Left lat. distal prox. calf. Left lat distal distal calf   Squamous cell carcinoma of skin 05/14/2017    Left bicep. Left dorsum mid forearm   Squamous cell carcinoma of skin 06/25/2017   Right med. distal calf x3   Squamous cell carcinoma of skin 09/02/2017   Left med. infraclavicular   Squamous cell carcinoma of skin 12/21/2017   Left prox. med. lower leg. Right forearm. Left forearm. Left bicep   Squamous cell carcinoma of skin 01/07/2018   Left dorsum foot prox. Left dorsum foot distal. Left mid to distal calf. Right chest   Squamous cell carcinoma of skin 03/08/2018   Left bicep. Right proximal dorsum forearm. Right distal med. pretibial sup. Right distal med pretibial inf.    Squamous cell carcinoma of skin 04/05/2018   Right sup. med scapula   Squamous cell carcinoma of skin 06/03/2018   Left med. ankle sup. Left med. ankle inf. Left lateral ankle.   Squamous cell carcinoma of skin 09/01/2018   Right mid lat. calf. Right lat. knee. Prox post calf   Squamous cell carcinoma of skin 11/01/2018   Left lateral ankle   Squamous cell carcinoma of skin 12/08/2018   Right lateral ankle lat malleolus   Squamous cell carcinoma of skin 01/05/2019   Right lateral elbow. Left mid medial calf. Left prox. ant. thigh. Right distal lat tricep   Squamous cell carcinoma of skin 01/19/2019   Right popliteal   Squamous cell carcinoma of skin 02/03/2019   Left mid lat calf. Left mid calf. Left dorsum lat distal foot. Left dorsum distal foot   Squamous cell carcinoma of skin 03/30/2019  Right lower leg above med ankle ant. Right lower leg above ankle sup. Right lower leg above the med ankle inf.   Squamous cell carcinoma of skin 05/12/2019   Right chest medial. Left deltoid   Squamous cell carcinoma of skin 06/23/2019   Right ant thigh distal above knee. Left med distal thigh. Left ant thigh above knee   Squamous cell carcinoma of skin 07/20/2019   Right chest. Right dorsal hand.   Squamous cell carcinoma of skin 08/03/2019   Left distal lat pretibial. Left distal med calf. Right prox. lat calf    Squamous cell carcinoma of skin 09/05/2019   Right middle bicep. Right sup lat bicep. Right sup mid bicep. Right sup med bicep   Squamous cell carcinoma of skin 09/15/2019   Right proximal med pretibial. Left distal lat thigh   Squamous cell carcinoma of skin 04/02/2020   Right calf, bleow right medial knee sup, below right medial knee post, below right medial knee inf   Squamous cell carcinoma of skin 05/14/2020   Right ant. shoulder. Mod. Diff. SCC. EDC   Squamous cell carcinoma of skin 06/27/2020   R mid to distal pretibial - ED&C   Squamous cell carcinoma of skin 06/27/2020   Below the R knee - ED&C    Squamous cell carcinoma of skin 06/27/2020   R prox pretibial    Squamous cell carcinoma of skin 06/27/2020   L distal med calf   Squamous cell carcinoma of skin 08/06/2020   Left ant deltoid   Squamous cell carcinoma of skin 12/25/2020   Left lat ankle EDC   Squamous cell carcinoma of skin 03/27/2021   left knee, EDC   Squamous cell carcinoma of skin 03/27/2021   left lateral leg, EDC   Squamous cell carcinoma of skin 07/03/2021   Left lat neck - EDC   Squamous cell carcinoma of skin 07/03/2021   Right ant thigh - EDC   Squamous cell carcinoma of skin 07/03/2021   Right prox lat pretibial - EDC   Squamous cell carcinoma of skin 10/16/2021   right med mid calf, EDC   Squamous cell carcinoma of skin 10/16/2021   left medial prox pretibial, EDC   Squamous cell carcinoma of skin 10/16/2021   left distal pretibial, EDC   Squamous cell carcinoma of skin 03/26/2023   Left ant lat ankle EDC   Squamous cell carcinoma of skin 03/26/2023   Left medial lower leg above ankle EDC EDC   Squamous cell carcinoma of skin 03/26/2023   Right lat ankle sup EDC   Squamous cell carcinoma of skin 03/26/2023   Right lat ankle middle EDC   Squamous cell carcinoma of skin 03/26/2023   Right lat ankle inf EDC   Squamous cell carcinoma of skin 10/15/2023   left distal pretibial, EDC    Squamous cell carcinoma of skin 10/15/2023   left proximal pretibial, EDC   Squamous cell carcinoma of skin 10/15/2023   right middle medial lower leg, EDC    Past Surgical History:  Procedure Laterality Date   BREAST EXCISIONAL BIOPSY Left 1972   neg   BREAST EXCISIONAL BIOPSY Right 1980   neg   CARDIOVERSION N/A 03/15/2024   Procedure: CARDIOVERSION;  Surgeon: Alluri, Keller BROCKS, MD;  Location: [facility] ORS;  Service: Cardiovascular;  Laterality: N/A;   COLONOSCOPY     ESOPHAGOGASTRODUODENOSCOPY (EGD) WITH PROPOFOL  N/A 04/09/2015   Procedure: ESOPHAGOGASTRODUODENOSCOPY (EGD) WITH PROPOFOL ;  Surgeon: Lamar ONEIDA Holmes, MD;  Location: [facility] ENDOSCOPY;  Service: Endoscopy;  Laterality: N/A;   skin cancer removal     TEE WITHOUT CARDIOVERSION N/A 03/15/2024   Procedure: ECHOCARDIOGRAM, TRANSESOPHAGEAL;  Surgeon: Alluri, Keller BROCKS, MD;  Location: [facility] ORS;  Service: Cardiovascular;  Laterality: N/A;       Recent HPI and Hospital Course  Hospital Course:  For full details, please see H&P, progress notes, consult notes and ancillary notes.  Briefly,  Joann Warren is a 78 y.o. female with medical history significant of heart failure with reduced EF, hypertension, CKD 3B, who presented from cardiology clinic complaining of dyspnea on exertion, dizziness.     In cardiology office she was found to be in atrial fibrillation RVR, heart rate 145, T wave inversion seen in V5 and V6.  She was sent to the ER for further evaluation.   On arrival to the ED patient received IV diltiazem  which initially improved her heart rate however RVR recurred.  Patient was started on diltiazem  with minimal improvement.  She was then started on amiodarone  with minimal improvement.  She underwent DCCV on 7/29 which was successful.     Atrial fibrillation, RVR - New diagnosis for this patient - Failed initial diltiazem  and amiodarone  - Underwent cardioversion on 7/29 with success. --cont on oral amiodarone  with  loading dose. --increase home Toprol  from 25 to 50 mg daily --started on Eliquis .   Heart failure with reduced EF, acute exacerbation - Echo this admission: EF 20%, global hypokinesis, mild MR, moderate TR, elevated PASP. - Nuclear stress February 2025: Hypokinesis of global wall, small perfusion abnormality of mild intensity in anterior region.  No clear evidence of ischemia.  Given absence of anginal symptoms plan was to proceed with medical management. - Patient was previously on Jardiance which was discontinued due to weight loss --received IV lasix  40 x4 doses, discharged on oral lasix  20 mg daily --Started on Entresto   --D/c'ed losartan  and amlodipine    Lung nodule - Incidentally seen on CT.  4 mm nodule in right upper lung no follow-up needed if low risk patient.  PCP follow-up.   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   Hyperlipidemia - Continue statin  Post Hospital Acute Care Issue to be followed in the Clinic  Heart failure CKD AFIB   Subjective:   [facility] today has, No headache, No chest pain, No abdominal pain - No Nausea, No new weakness tingling or numbness, No Cough - SOB.  Assessment & Plan    1. Atrial fibrillation with RVR (HCC) (Primary) Cardioverted successfully and discharged on amiodarone  and eliquis . Also her metoprolol  dose was adjusted. She will follow up with cardiology soon.   2. Heart failure with reduced ejection fraction (HFrEF, <= 40%) (HCC) TEE done and her EF is low at 20%. She was discharged on entresto . Losartan  and amlodipine  were discontinued. She has an upcoming appt with the heart failure clinic. Briefly discussed daily weights.   3. Chronic kidney disease (CKD) stage G3b/A1, moderately decreased glomerular filtration rate (GFR) between 30-44 mL/min/1.73 square meter and albuminuria creatinine ratio less than 30 mg/g (HCC) Continue to monitor CKD with labs.   4. Hospital discharge  follow-up Cardioverted and discharged on new medications which were discussed in detail with the patient today.    Reason for frequent admissions/ER visits   Heart failure Afib CKD     Objective:   Vitals:   03/21/24 1444  BP: 120/74  Pulse: 69  Resp: 16  Temp: 98.2 F (36.8  C)  SpO2: 95%  Weight: 103 lb 6.4 oz (46.9 kg)  Height: 5' 2 (1.575 m)    Wt Readings from Last 3 Encounters:  03/21/24 103 lb 6.4 oz (46.9 kg)  03/14/24 106 lb (48.1 kg)  02/02/24 106 lb 3.2 oz (48.2 kg)    Allergies as of 03/21/2024       Reactions   Neomycin-bacitracin Zn-polymyx         Medication List        Accurate as of March 21, 2024  3:12 PM. If you have any questions, ask your nurse or doctor.          alendronate  70 MG tablet Commonly known as: FOSAMAX  TAKE 1 TABLET EVERY 7 DAYS WITH A FULL GLASS OF WATER ON AN EMPTY STOMACH DO NOT LIE DOWN FOR AT LEAST 30 MIN   amiodarone  200 MG tablet Commonly known as: PACERONE  Take 2 tablets (400 mg total) by mouth 2 (two) times daily for 9 days, THEN 1 tablet (200 mg total) daily. Start taking on: March 16, 2024   apixaban  5 MG Tabs tablet Commonly known as: ELIQUIS  Take 1 tablet (5 mg total) by mouth 2 (two) times daily.   furosemide  20 MG tablet Commonly known as: LASIX  Take 1 tablet (20 mg total) by mouth daily.   hydrOXYzine  10 MG tablet Commonly known as: ATARAX  TAKE ONE TABLET ONCE OR TWICE A DAY IF NEEDED FOR ITCH   metoprolol  succinate 50 MG 24 hr tablet Commonly known as: TOPROL -XL Take 1 tablet (50 mg total) by mouth daily. Increased from 25 mg.   Prevagen 10 MG Caps Generic drug: Apoaequorin Take 10 mg by mouth daily.   sacubitril -valsartan  24-26 MG Commonly known as: ENTRESTO  Take 1 tablet by mouth 2 (two) times daily.   simvastatin  20 MG tablet Commonly known as: ZOCOR  Take 1 tablet (20 mg total) by mouth at bedtime.         Physical Exam: Constitutional: Patient appears well-developed and  well-nourished. Not in obvious distress. HENT: Normocephalic, atraumatic Eyes: Conjunctivae and EOM are normal. PERRLA, no scleral icterus. CVS: RRR, S1/S2 +, no murmurs, no gallops, no carotid bruit.  Pulmonary: Effort and breath sounds normal, no stridor, rhonchi, wheezes, rales.  Musculoskeletal: Normal range of motion.  Lymphadenopathy: No lymphadenopathy noted, cervical, inguinal or axillary Neuro: Alert and oriented Skin: Skin is warm and dry.  Psychiatric: Normal mood and affect. Behavior, judgment, thought content normal.   Data Review   Micro Results No results found for this or any previous visit (from the past 240 hours).   CBC Recent Labs  Lab 03/16/24 0409  WBC 8.2  HGB 11.6*  HCT 36.4  PLT 230  MCV 87.7  MCH 28.0  MCHC 31.9  RDW 15.2    Chemistries  Recent Labs  Lab 03/15/24 1040 03/15/24 1427 03/16/24 0409  NA 138 138 137  K 4.0 4.3 3.2*  CL 104 101 100  CO2 21* 23 25  GLUCOSE 123* 106* 102*  BUN 26* 25* 24*  CREATININE 1.37* 1.38* 1.44*  CALCIUM 8.4* 8.6* 8.4*   ------------------------------------------------------------------------------------------------------------------ estimated creatinine clearance is 23.8 mL/min (A) (by C-G formula based on SCr of 1.44 mg/dL (H)). ------------------------------------------------------------------------------------------------------------------ No results for input(s): HGBA1C in the last 72 hours. ------------------------------------------------------------------------------------------------------------------ No results for input(s): CHOL, HDL, LDLCALC, TRIG, CHOLHDL, LDLDIRECT in the last 72 hours. ------------------------------------------------------------------------------------------------------------------ No results for input(s): TSH, T4TOTAL, T3FREE, THYROIDAB in the last 72 hours.  Invalid input(s):  FREET3 ------------------------------------------------------------------------------------------------------------------ No results for input(s):  VITAMINB12, FOLATE, FERRITIN, TIBC, IRON, RETICCTPCT in the last 72 hours.  Coagulation profile No results for input(s): INR, PROTIME in the last 168 hours.  No results for input(s): DDIMER in the last 72 hours.  Cardiac Enzymes No results for input(s): CKMB, TROPONINI, MYOGLOBIN in the last 168 hours.  Invalid input(s): CK ------------------------------------------------------------------------------------------------------------------ Invalid input(s): POCBNP  Return for has upcoming appt in september.   Time Spent in minutes  45 Time spent with patient included reviewing progress notes, labs, imaging studies, and discussing plan for follow up.   This patient was seen by Mardy Maxin, FNP-C in collaboration with Dr. Sigrid Bathe as a part of collaborative care agreement.  Mardy Maxin MSN, FNP-C on 03/21/2024 at 3:00 PM   **Disclaimer: This note may have been dictated with voice recognition software. Similar sounding words can inadvertently be transcribed and this note may contain transcription errors which may not have been corrected upon publication of note.**

## 2024-03-22 ENCOUNTER — Other Ambulatory Visit: Payer: Self-pay | Admitting: Dermatology

## 2024-03-22 DIAGNOSIS — I10 Essential (primary) hypertension: Secondary | ICD-10-CM | POA: Diagnosis not present

## 2024-03-22 DIAGNOSIS — I5020 Unspecified systolic (congestive) heart failure: Secondary | ICD-10-CM | POA: Diagnosis not present

## 2024-03-22 DIAGNOSIS — I4819 Other persistent atrial fibrillation: Secondary | ICD-10-CM | POA: Diagnosis not present

## 2024-03-22 DIAGNOSIS — L299 Pruritus, unspecified: Secondary | ICD-10-CM

## 2024-03-22 DIAGNOSIS — I340 Nonrheumatic mitral (valve) insufficiency: Secondary | ICD-10-CM | POA: Diagnosis not present

## 2024-03-29 ENCOUNTER — Telehealth: Payer: Self-pay

## 2024-03-29 DIAGNOSIS — I5020 Unspecified systolic (congestive) heart failure: Secondary | ICD-10-CM | POA: Diagnosis not present

## 2024-03-29 NOTE — Telephone Encounter (Signed)
 Patient called and wanted to let you know she is now on a blood thinner and to be aware for when she comes in this week for another [facility] recheck.

## 2024-03-30 DIAGNOSIS — I5020 Unspecified systolic (congestive) heart failure: Secondary | ICD-10-CM | POA: Diagnosis not present

## 2024-03-30 DIAGNOSIS — Z130 Encounter for screening for diseases of the blood and blood-forming organs and certain disorders involving the immune mechanism: Secondary | ICD-10-CM | POA: Diagnosis not present

## 2024-03-30 DIAGNOSIS — I480 Paroxysmal atrial fibrillation: Secondary | ICD-10-CM | POA: Diagnosis not present

## 2024-03-30 DIAGNOSIS — I4819 Other persistent atrial fibrillation: Secondary | ICD-10-CM | POA: Diagnosis not present

## 2024-03-31 ENCOUNTER — Encounter: Payer: Self-pay | Admitting: Dermatology

## 2024-03-31 ENCOUNTER — Ambulatory Visit: Admitting: Dermatology

## 2024-03-31 DIAGNOSIS — C44722 Squamous cell carcinoma of skin of right lower limb, including hip: Secondary | ICD-10-CM | POA: Diagnosis not present

## 2024-03-31 DIAGNOSIS — Z79899 Other long term (current) drug therapy: Secondary | ICD-10-CM

## 2024-03-31 DIAGNOSIS — D492 Neoplasm of unspecified behavior of bone, soft tissue, and skin: Secondary | ICD-10-CM

## 2024-03-31 DIAGNOSIS — Z8589 Personal history of malignant neoplasm of other organs and systems: Secondary | ICD-10-CM

## 2024-03-31 DIAGNOSIS — W908XXA Exposure to other nonionizing radiation, initial encounter: Secondary | ICD-10-CM

## 2024-03-31 DIAGNOSIS — L578 Other skin changes due to chronic exposure to nonionizing radiation: Secondary | ICD-10-CM | POA: Diagnosis not present

## 2024-03-31 DIAGNOSIS — C44729 Squamous cell carcinoma of skin of left lower limb, including hip: Secondary | ICD-10-CM | POA: Diagnosis not present

## 2024-03-31 DIAGNOSIS — Z7189 Other specified counseling: Secondary | ICD-10-CM

## 2024-03-31 DIAGNOSIS — Z85828 Personal history of other malignant neoplasm of skin: Secondary | ICD-10-CM

## 2024-03-31 DIAGNOSIS — D485 Neoplasm of uncertain behavior of skin: Secondary | ICD-10-CM | POA: Diagnosis not present

## 2024-03-31 DIAGNOSIS — L570 Actinic keratosis: Secondary | ICD-10-CM | POA: Diagnosis not present

## 2024-03-31 DIAGNOSIS — Z5111 Encounter for antineoplastic chemotherapy: Secondary | ICD-10-CM

## 2024-03-31 NOTE — Patient Instructions (Addendum)
 - Re-start 5-fluorouracil /calcipotriene cream twice a day for up to 2 weeks to affected areas including top of left foot, R leg. Prescription sent to [facility]. Patient advised they will receive an email to purchase the medication online and have it sent to their home. Patient provided with handout reviewing treatment course and side effects and advised to call or message us  on MyChart with any concerns.   Reviewed course of treatment and expected reaction.  Patient advised to expect inflammation and crusting and advised that erosions are possible.  Patient advised to be diligent with sun protection during and after treatment. Counseled to keep medication out of reach of children and pets.   Electrodesiccation and Curettage ("Scrape and Burn") Wound Care Instructions  Leave the original bandage on for 24 hours if possible.  If the bandage becomes soaked or soiled before that time, it is OK to remove it and examine the wound.  A small amount of post-operative bleeding is normal.  If excessive bleeding occurs, remove the bandage, place gauze over the site and apply continuous pressure (no peeking) over the area for 30 minutes. If this does not work, please call our clinic as soon as possible or page your doctor if it is after hours.   Once a day, cleanse the wound with soap and water. It is fine to shower. If a thick crust develops you may use a Q-tip dipped into dilute hydrogen peroxide (mix 1:1 with water) to dissolve it.  Hydrogen peroxide can slow the healing process, so use it only as needed.    After washing, apply petroleum jelly (Vaseline) or an antibiotic ointment if your doctor prescribed one for you, followed by a bandage.    For best healing, the wound should be covered with a layer of ointment at all times. If you are not able to keep the area covered with a bandage to hold the ointment in place, this may mean re-applying the ointment several times a day.  Continue  this wound care until the wound has healed and is no longer open. It may take several weeks for the wound to heal and close.  Itching and mild discomfort is normal during the healing process.  If you have any discomfort, you can take Tylenol  (acetaminophen ) or ibuprofen  as directed on the bottle. (Please do not take these if you have an allergy to them or cannot take them for another reason).  Some redness, tenderness and white or yellow material in the wound is normal healing.  If the area becomes very sore and red, or develops a thick yellow-green material (pus), it may be infected; please notify us .    Wound healing continues for up to one year following surgery. It is not unusual to experience pain in the scar from time to time during the interval.  If the pain becomes severe or the scar thickens, you should notify the office.    A slight amount of redness in a scar is expected for the first six months.  After six months, the redness will fade and the scar will soften and fade.  The color difference becomes less noticeable with time.  If there are any problems, return for a post-op surgery check at your earliest convenience.  To improve the appearance of the scar, you can use silicone scar gel, cream, or sheets (such as Mederma or Serica) every night for up to one year. These are available over the counter (without a prescription).  Please call our  office at [phone] for any questions or concerns.  Due to recent changes in healthcare laws, you may see results of your pathology and/or laboratory studies on MyChart before the doctors have had a chance to review them. We understand that in some cases there may be results that are confusing or concerning to you. Please understand that not all results are received at the same time and often the doctors may need to interpret multiple results in order to provide you with the best plan of care or course of treatment. Therefore, we ask that you  please give us  2 business days to thoroughly review all your results before contacting the office for clarification. Should we see a critical lab result, you will be contacted sooner.   If You Need Anything After Your Visit  If you have any questions or concerns for your doctor, please call our main line at [phone] and press option 4 to reach your doctor's medical assistant. If no one answers, please leave a voicemail as directed and we will return your call as soon as possible. Messages left after 4 pm will be answered the following business day.   You may also send us  a message via MyChart. We typically respond to MyChart messages within 1-2 business days.  For prescription refills, please ask your pharmacy to contact our office. Our fax number is [phone].  If you have an urgent issue when the clinic is closed that cannot wait until the next business day, you can page your doctor at the number below.    Please note that while we do our best to be available for urgent issues outside of office hours, we are not available 24/7.   If you have an urgent issue and are unable to reach us , you may choose to seek medical care at your doctor's office, retail clinic, urgent care center, or emergency room.  If you have a medical emergency, please immediately call 911 or go to the emergency department.  Pager Numbers  - Dr. Hester: [phone]  - Dr. Jackquline: [phone]  - Dr. Claudene: [phone]   In the event of inclement weather, please call our main line at [phone] for an update on the status of any delays or closures.  Dermatology Medication Tips: Please keep the boxes that topical medications come in in order to help keep track of the instructions about where and how to use these. Pharmacies typically print the medication instructions only on the boxes and not directly on the medication tubes.   If your medication is too expensive, please contact our office at  [phone] option 4 or send us  a message through MyChart.   We are unable to tell what your co-pay for medications will be in advance as this is different depending on your insurance coverage. However, we may be able to find a substitute medication at lower cost or fill out paperwork to get insurance to cover a needed medication.   If a prior authorization is required to get your medication covered by your insurance company, please allow us  1-2 business days to complete this process.  Drug prices often vary depending on where the prescription is filled and some pharmacies may offer cheaper prices.  The website www.goodrx.com contains coupons for medications through different pharmacies. The prices here do not account for what the cost may be with help from insurance (it may be cheaper with your insurance), but the website can give you the price if you did not use any insurance.  -  You can print the associated coupon and take it with your prescription to the pharmacy.  - You may also stop by our office during regular business hours and pick up a GoodRx coupon card.  - If you need your prescription sent electronically to a different pharmacy, notify our office through [facility] or by phone at [phone] option 4.     Si Usted Necesita Algo Despus de Su Visita  Tambin puede enviarnos un mensaje a travs de Clinical cytogeneticist. Por lo general respondemos a los mensajes de MyChart en el transcurso de 1 a 2 das hbiles.  Para renovar recetas, por favor pida a su farmacia que se ponga en contacto con nuestra oficina. Randi lakes de fax es [place] [phone].  Si tiene un asunto urgente cuando la clnica est cerrada y que no puede esperar hasta el siguiente da hbil, puede llamar/localizar a su doctor(a) al nmero que aparece a continuacin.   Por favor, tenga en cuenta que aunque hacemos todo lo posible para estar disponibles para asuntos urgentes fuera del horario de [place], no estamos  disponibles las 24 horas del da, los 7 [address]  [address] de la [place].   Si tiene un problema urgente y no puede comunicarse con nosotros, puede optar por buscar atencin mdica  en el consultorio de su doctor(a), en una clnica privada, en un centro de atencin urgente o en una sala de emergencias.  Si tiene Engineer, drilling, por favor llame inmediatamente al 911 o vaya a la sala de emergencias.  Nmeros de bper  - Dr. Hester: [phone]  - Dra. Jackquline: [phone]  - Dr. Claudene: [phone]   En caso de inclemencias del tiempo, por favor llame a landry capes principal al [phone] para una actualizacin sobre el [place] de cualquier retraso o cierre.  Consejos para la medicacin en dermatologa: Por favor, guarde las cajas en las que vienen los medicamentos de uso tpico para ayudarle a seguir las instrucciones sobre dnde y cmo usarlos. Las farmacias generalmente imprimen las instrucciones del medicamento slo en las cajas y no directamente en los tubos del [place].   Si su medicamento es muy caro, por favor, pngase en contacto con landry rieger llamando al [phone] y presione la opcin 4 o envenos un mensaje a travs de Clinical cytogeneticist.   No podemos decirle cul ser su copago por los medicamentos por adelantado ya que esto es diferente dependiendo de la cobertura de su seguro. Sin embargo, es posible que podamos encontrar un medicamento sustituto a Audiological scientist un formulario para que el seguro cubra el medicamento que se considera necesario.   Si se requiere una autorizacin previa para que su compaa de seguros malta su medicamento, por favor permtanos de 1 a 2 das hbiles para completar este proceso.  Los precios de los medicamentos varan con frecuencia dependiendo del Environmental consultant de dnde se surte la receta y alguna farmacias pueden ofrecer precios ms baratos.  El sitio web www.goodrx.com tiene cupones para medicamentos de Health and safety inspector. Los precios aqu no  tienen en cuenta lo que podra costar con la ayuda del seguro (puede ser ms barato con su seguro), pero el sitio web puede darle el precio si no utiliz Tourist information centre manager.  - Puede imprimir el cupn correspondiente y llevarlo con su receta a la farmacia.  - Tambin puede pasar por nuestra oficina durante el horario de atencin regular y Education officer, museum una tarjeta de cupones de GoodRx.  - Si necesita que su receta se enve electrnicamente a [employer],  informe a nuestra oficina a travs de MyChart de [place] o por telfono llamando al [phone] y presione la opcin 4.

## 2024-03-31 NOTE — Progress Notes (Unsigned)
 Follow-Up Visit   Subjective  Joann Warren is a 78 y.o. female who presents for the following: Biopsy f/u, patient states biopsies are healing okay, no issues. Spot on L foot dorsum after last appointment.  The patient has spots, moles and lesions to be evaluated, some may be new or changing and the patient may have concern these could be cancer.  The following portions of the chart were reviewed this encounter and updated as appropriate: medications, allergies, medical history  Review of Systems:  No other skin or systemic complaints except as noted in HPI or Assessment and Plan.  Objective  Well appearing patient in no apparent distress; mood and affect are within normal limits.  A focused examination was performed of the following areas: Legs   Relevant exam findings are noted in the Assessment and Plan.  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 - diffuse scaly erythematous macules and papules with underlying dyspigmentation - Discussed Prescription Field Treatment topical Chemotherapy for Severe, Chronic Confluent Actinic Changes with Pre-Cancerous Actinic Keratoses Field treatment involves treatment of an entire area of skin that has confluent Actinic Changes (Sun/ Ultraviolet light damage) and PreCancerous Actinic Keratoses by method of PhotoDynamic Therapy (PDT) and/or prescription Topical Chemotherapy agents such as 5-fluorouracil , 5-fluorouracil /calcipotriene, and/or imiquimod.  The purpose is to  decrease the number of clinically evident and subclinical PreCancerous lesions to prevent progression to development of skin cancer by chemically destroying early precancer changes that may or may not be visible.  It has been shown to reduce the risk of developing skin cancer in the treated area. As a result of treatment, redness, scaling, crusting, and open sores may occur during treatment course. One or more than one of these methods may be used and may have to be used several times to control, suppress and eliminate the PreCancerous changes. Discussed treatment course, expected reaction, and possible side effects. - Recommend daily broad spectrum sunscreen SPF 30+ to sun-exposed areas, reapply every 2 hours as needed.  - Staying in the shade or wearing long sleeves, sun glasses (UVA+UVB protection) and wide brim hats (4-inch brim around the entire circumference of the hat) are also recommended. - Call for new or changing lesions.  - Re-start 5-fluorouracil /calcipotriene cream twice a day for up to 2 weeks to affected areas bilateral legs; feet.   Prescription sent to [facility]. Patient advised they will receive an email to purchase the medication online and have it sent to their home. Patient provided with handout reviewing treatment course and side effects and advised to call or message us  on MyChart with any concerns.   Reviewed course of treatment and expected reaction.  Patient advised to expect inflammation and crusting and advised that erosions are possible.  Patient advised to be diligent with sun protection during and after treatment. Counseled to keep medication out of reach of children and pets.  NEOPLASM OF SKIN (3) Left dosrum lateral foot Epidermal / dermal shaving  Lesion diameter (cm):  1.7 Informed consent: discussed and consent obtained   Timeout: patient name, date of birth,  surgical site, and procedure verified   Procedure prep:  Patient was prepped and  draped in usual sterile fashion Prep type:  Isopropyl alcohol Anesthesia: the lesion was anesthetized in a standard fashion   Anesthetic:  1% lidocaine  w/ epinephrine 1-100,000 buffered w/ 8.4% NaHCO3 Instrument used: flexible razor blade   Hemostasis achieved with: pressure, aluminum chloride and electrodesiccation   Outcome: patient tolerated procedure well   Post-procedure details: sterile dressing applied and wound care instructions given   Dressing type: bandage and petrolatum    Destruction of lesion Complexity: extensive   Destruction method: electrodesiccation and curettage   Informed consent: discussed and consent obtained   Timeout:  patient name, date of birth, surgical site, and procedure verified Procedure prep:  Patient was prepped and draped in usual sterile fashion Prep type:  Isopropyl alcohol Anesthesia: the lesion was anesthetized in a standard fashion   Anesthetic:  1% lidocaine  w/ epinephrine 1-100,000 buffered w/ 8.4% NaHCO3 Curettage performed in three different directions: Yes   Electrodesiccation performed over the curetted area: Yes   Final wound size (cm):  1.7 Hemostasis achieved with:  pressure, aluminum chloride and electrodesiccation Outcome: patient tolerated procedure well with no complications   Post-procedure details: sterile dressing applied and wound care instructions given   Dressing type: bandage and petrolatum    Specimen 1 - Surgical pathology Differential Diagnosis: R/o SCC  Check Margins: No 1.7 cm hyperkeratotic papule EDC today Left medial lower leg above ankle Epidermal / dermal shaving  Lesion diameter (cm):  1.2 Informed consent: discussed and consent obtained   Timeout: patient name, date of birth, surgical site, and procedure verified   Procedure prep:  Patient was prepped and draped in usual sterile fashion Prep type:  Isopropyl alcohol Anesthesia: the lesion was anesthetized in a standard fashion   Anesthetic:  1% lidocaine   w/ epinephrine 1-100,000 buffered w/ 8.4% NaHCO3 Instrument used: flexible razor blade   Hemostasis achieved with: pressure, aluminum chloride and electrodesiccation   Outcome: patient tolerated procedure well   Post-procedure details: sterile dressing applied and wound care instructions given   Dressing type: bandage and petrolatum    Destruction of lesion Complexity: extensive   Destruction method: electrodesiccation and curettage   Informed consent: discussed and consent obtained   Timeout:  patient name, date of birth, surgical site, and procedure verified Procedure prep:  Patient was prepped and draped in usual sterile fashion Prep type:  Isopropyl alcohol Anesthesia: the lesion was anesthetized in a standard fashion   Anesthetic:  1% lidocaine  w/ epinephrine 1-100,000 buffered w/ 8.4% NaHCO3 Curettage performed in three different directions: Yes   Electrodesiccation performed over the curetted area: Yes   Final wound size (cm):  1.2 Hemostasis achieved with:  pressure, aluminum chloride and electrodesiccation Outcome: patient tolerated procedure well with no complications   Post-procedure details: sterile dressing applied and wound care instructions given   Dressing type: bandage and petrolatum    Specimen 2 - Surgical pathology Differential Diagnosis: R/o SCC  Check Margins: No 1.2 hyperkeratotic papule EDC today Right medial calf Epidermal / dermal shaving  Lesion diameter (cm):  0.7 Informed consent: discussed and consent obtained   Timeout: patient name, date of birth, surgical site, and procedure verified   Procedure prep:  Patient was prepped and draped in usual sterile fashion Prep type:  Isopropyl alcohol Anesthesia: the lesion was anesthetized in a standard fashion   Anesthetic:  1% lidocaine  w/ epinephrine 1-100,000 buffered w/ 8.4% NaHCO3 Instrument used: flexible razor blade  Hemostasis achieved with: pressure, aluminum chloride and electrodesiccation    Outcome: patient tolerated procedure well   Post-procedure details: sterile dressing applied and wound care instructions given   Dressing type: bandage and petrolatum    Destruction of lesion Complexity: extensive   Destruction method: electrodesiccation and curettage   Informed consent: discussed and consent obtained   Timeout:  patient name, date of birth, surgical site, and procedure verified Procedure prep:  Patient was prepped and draped in usual sterile fashion Prep type:  Isopropyl alcohol Anesthesia: the lesion was anesthetized in a standard fashion   Anesthetic:  1% lidocaine  w/ epinephrine 1-100,000 buffered w/ 8.4% NaHCO3 Curettage performed in three different directions: Yes   Electrodesiccation performed over the curetted area: Yes   Lesion length (cm):  0.7 Lesion width (cm):  0.7 Margin per side (cm):  0.2 Final wound size (cm):  1.1 Hemostasis achieved with:  pressure, aluminum chloride and electrodesiccation Outcome: patient tolerated procedure well with no complications   Post-procedure details: sterile dressing applied and wound care instructions given   Dressing type: bandage and petrolatum    Specimen 3 - Surgical pathology Differential Diagnosis: R/o SCC  Check Margins: No 0.7 cm hyperkeratotic papule EDC today Patient advised she may apply mupirocin  ointmen qdto biopsy sites as they are healing.  ACTINIC SKIN DAMAGE   CHEMOTHERAPY MANAGEMENT, ENCOUNTER FOR   COUNSELING AND COORDINATION OF CARE   MEDICATION MANAGEMENT   HISTORY OF SQUAMOUS CELL CARCINOMA   SCC (SQUAMOUS CELL CARCINOMA), FOOT, LEFT   SCC (SQUAMOUS CELL CARCINOMA), LEG, RIGHT   SCC (SQUAMOUS CELL CARCINOMA), LEG, LEFT     HISTORY OF SQUAMOUS CELL CARCINOMA OF THE SKIN. Multiple sites, see history. - No evidence of recurrence today - No lymphadenopathy - Recommend regular full body skin exams - Recommend daily broad spectrum sunscreen SPF 30+ to sun-exposed areas,  reapply every 2 hours as needed.  - Call if any new or changing lesions are noted between office visits  Reviewed note from Dr. Zelphia Cap, 11/03/2023.   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 Patient is immunocompetent.  HIV was previously tested and was negative.  Clinically she does not have any inguinal lymph node enlargements. I discussed with patient and her husband that systemic chemoprevention does not have an established role in the prevention of cutaneous squamous cell carcinoma.    Immunotherapy cemiplimab or pembrolizumab may be options if she develops primary or recurrent locally advanced lesion that is refractory to surgery/radiation or in metastatic setting.  In neoadjuvant setting, cemiplimab may be offered in patient with stage II to stage IV disease.  In this phase 2 trial, skin lesions of stage II are at least 3 cm in greatest diameter.  If patient has high risk, very high risk squamous cell lesions resected with positive margin status and additional surgery is not feasible due to anatomic constraints, alternative treatment with radiation or immunotherapy can be considered. I will further discuss with dermatology regarding recent surgery pathology findings.   Currently I think her best option is to proceed with regular screening and surgical treatment for precancer and early stage cancer lesions. Oral retinoids, nicotinamide,  may be effective in reducing development of AK and SCC.-I will defer to dermatology.   Return in about 3 months (around 07/01/2024) for w/ Dr. Laasya Peyton biopsy f/u.  I, Jacquelynn V. Wilfred, CMA, am acting as  scribe for Alm Rhyme, MD .   Documentation: I have reviewed the above documentation for accuracy and completeness, and I agree with the above.  Alm Rhyme, MD

## 2024-04-04 ENCOUNTER — Telehealth: Payer: Self-pay

## 2024-04-04 LAB — SURGICAL PATHOLOGY

## 2024-04-04 NOTE — Progress Notes (Addendum)
   04/04/2024  Patient ID: Joann Warren, female   DOB: 1945/08/29, 78 y.o.   MRN: [id number]   This patient is appearing on a report for being at risk of failing the adherence measure for identified medications this calendar year.   Medication Adherence Summary (STAR/HEDIS Monitoring): Adherence Category: diabetes - not dx of this in the chart    Drug Name: Jardiance 10 mg  Last Fill or Sold Date:discontinued for heart failure  Drug Name: Simvastatin  20 mg  Last Fill or Sold Date: Not filled, per Dr. Annemarie. I called patient and reports that she is compliant with her medications. On 02/04/2024 is the last time simvastatin  was filled, per patient, for 90 tablets. She fills at [facility] - which typically does not show in Dr. Annemarie.   ? Plan: Will follow up prior to next fill.   Dorcas Solian, PharmD Clinical Pharmacist Cell: [phone]

## 2024-04-05 ENCOUNTER — Ambulatory Visit: Payer: Self-pay | Admitting: Dermatology

## 2024-04-05 ENCOUNTER — Encounter: Payer: Self-pay | Admitting: Dermatology

## 2024-04-05 NOTE — Telephone Encounter (Addendum)
 Called and discussed bx results with patient. She verbalized understanding and denied further questions. Will recheck areas at next follow up in November.   ----- Message from Alm Rhyme sent at 04/05/2024  9:26 AM EDT ----- FINAL DIAGNOSIS        1. Skin, left dorsum lateral foot :       WELL DIFFERENTIATED SQUAMOUS CELL CARCINOMA        2. Skin, left medial lower leg above ankle :       WELL DIFFERENTIATED SQUAMOUS CELL CARCINOMA        3. Skin, right medial calf :       WELL DIFFERENTIATED SQUAMOUS CELL CARCINOMA    1,2,3 - all three Cancer = SCC All three already treated Recheck next visit ----- Message ----- From: Interface, Lab In Three Zero One Sent: 04/04/2024   6:35 PM EDT To: Alm JAYSON Rhyme, MD

## 2024-04-15 DIAGNOSIS — Z130 Encounter for screening for diseases of the blood and blood-forming organs and certain disorders involving the immune mechanism: Secondary | ICD-10-CM | POA: Diagnosis not present

## 2024-04-15 DIAGNOSIS — I5020 Unspecified systolic (congestive) heart failure: Secondary | ICD-10-CM | POA: Diagnosis not present

## 2024-04-19 DIAGNOSIS — I340 Nonrheumatic mitral (valve) insufficiency: Secondary | ICD-10-CM | POA: Diagnosis not present

## 2024-04-19 DIAGNOSIS — I10 Essential (primary) hypertension: Secondary | ICD-10-CM | POA: Diagnosis not present

## 2024-04-19 DIAGNOSIS — I5020 Unspecified systolic (congestive) heart failure: Secondary | ICD-10-CM | POA: Diagnosis not present

## 2024-04-19 DIAGNOSIS — I4819 Other persistent atrial fibrillation: Secondary | ICD-10-CM | POA: Diagnosis not present

## 2024-04-26 DIAGNOSIS — E875 Hyperkalemia: Secondary | ICD-10-CM | POA: Diagnosis not present

## 2024-04-30 ENCOUNTER — Other Ambulatory Visit: Payer: Self-pay | Admitting: Nurse Practitioner

## 2024-04-30 DIAGNOSIS — E782 Mixed hyperlipidemia: Secondary | ICD-10-CM

## 2024-05-09 ENCOUNTER — Telehealth: Payer: Self-pay

## 2024-05-09 NOTE — Progress Notes (Signed)
   05/09/2024  Patient ID: Joann Warren, female   DOB: 02-08-1946, 78 y.o.   MRN: [id number]  Pharmacy Quality Measure Review  This patient is appearing on a report for being at risk of failing the adherence measure for cholesterol (statin) medications this calendar year.   Medication: Simvastatin  Last fill date: 05/02/24 for 90 day supply  Insurance report was not up to date. No action needed at this time.    Jon VEAR Lindau, PharmD Clinical Pharmacist [phone]

## 2024-05-11 ENCOUNTER — Encounter: Payer: Self-pay | Admitting: Nurse Practitioner

## 2024-05-11 ENCOUNTER — Ambulatory Visit: Payer: PPO | Admitting: Nurse Practitioner

## 2024-05-11 VITALS — BP 136/88 | HR 65 | Temp 97.2°F | Resp 16 | Ht 62.0 in | Wt 107.4 lb

## 2024-05-11 DIAGNOSIS — R748 Abnormal levels of other serum enzymes: Secondary | ICD-10-CM | POA: Diagnosis not present

## 2024-05-11 DIAGNOSIS — E2839 Other primary ovarian failure: Secondary | ICD-10-CM

## 2024-05-11 DIAGNOSIS — Z1231 Encounter for screening mammogram for malignant neoplasm of breast: Secondary | ICD-10-CM

## 2024-05-11 DIAGNOSIS — Z0000 Encounter for general adult medical examination without abnormal findings: Secondary | ICD-10-CM | POA: Diagnosis not present

## 2024-05-11 DIAGNOSIS — E538 Deficiency of other specified B group vitamins: Secondary | ICD-10-CM

## 2024-05-11 DIAGNOSIS — M8589 Other specified disorders of bone density and structure, multiple sites: Secondary | ICD-10-CM | POA: Diagnosis not present

## 2024-05-11 NOTE — Progress Notes (Signed)
 [facility] [address] [place], [place] [address]  Internal MEDICINE  Office Visit Note  Patient Name: Joann Warren  [id number]  [id number]  Date of Service: 05/11/2024  Chief Complaint  Patient presents with   Hyperlipidemia   Hypertension   Medicare Wellness    HPI Carolynn presents for a medicare annual wellness visit.  Well-appearing 78 y.o. female with HFrEF, AFIB, MVR, anemia, glaucoma, osteoporosis, hyperlipidemia, hypertension, and many squamous cell and basal cell carcinoma lesions that have been removed.  Routine CRC screening: discontinued, aged out Routine mammogram: due in December  DEXA scan: due now  Labs: has had a lot of labs drawn since her hospital stay in July. Her liver enzymes were elevated while she was in the hospital and we need to recheck that and it has also been almost a year since we checked her B12 level.  New or worsening pain: none Other concerns: none      05/11/2024    8:35 AM 05/06/2023    9:02 AM 04/30/2022    9:11 AM  MMSE - Mini Mental State Exam  Orientation to time 5 5 5   Orientation to Place 5 5 5   Registration 3 3 3   Attention/ Calculation 5 5 5   Recall 3 3 3   Language- name 2 objects 2 2 2   Language- repeat 1 1 1   Language- follow 3 step command 3 3 3   Language- read & follow direction 1 1 1   Write a sentence 1 1 1   Copy design 1 1 1   Total score 30 30 30     Functional Status Survey: Is the patient deaf or have difficulty hearing?: No Does the patient have difficulty seeing, even when wearing glasses/contacts?: No Does the patient have difficulty concentrating, remembering, or making decisions?: No Does the patient have difficulty walking or climbing stairs?: No Does the patient have difficulty dressing or bathing?: No Does the patient have difficulty doing errands alone such as visiting a doctor's office or shopping?: No     04/30/2022    9:09 AM 05/06/2023    9:01 AM 07/20/2023   10:12 AM 10/06/2023     9:40 AM 05/11/2024    8:34 AM  Fall Risk  Falls in the past year? 0 0 0 0 0  Was there an injury with Fall? 0 0   0  Fall Risk Category Calculator 0 0   0  Fall Risk Category (Retired) Low       (RETIRED) Patient Fall Risk Level Low fall risk       Patient at Risk for Falls Due to No Fall Risks No Fall Risks   No Fall Risks  Fall risk Follow up Falls evaluation completed  Falls evaluation completed   Falls evaluation completed     Data saved with a previous flowsheet row definition       05/11/2024    8:34 AM  Depression screen PHQ 2/9  Decreased Interest 0  Down, Depressed, Hopeless 0  PHQ - 2 Score 0        Current Medication: Outpatient Encounter Medications as of 05/11/2024  Medication Sig Note   alendronate  (FOSAMAX ) 70 MG tablet TAKE 1 TABLET EVERY 7 DAYS WITH A FULL GLASS OF WATER ON AN EMPTY STOMACH DO NOT LIE DOWN FOR AT LEAST 30 MIN 03/14/2024: wed   amiodarone  (PACERONE ) 200 MG tablet Take 2 tablets (400 mg total) by mouth 2 (two) times daily for 9 days, THEN 1 tablet (200 mg total)  daily.    apixaban  (ELIQUIS ) 5 MG TABS tablet Take 1 tablet (5 mg total) by mouth 2 (two) times daily.    Apoaequorin (PREVAGEN) 10 MG CAPS Take 10 mg by mouth daily.    furosemide  (LASIX ) 20 MG tablet Take 1 tablet (20 mg total) by mouth daily.    hydrOXYzine  (ATARAX ) 10 MG tablet TAKE ONE TABLET ONCE OR TWICE A DAY IF NEEDED FOR ITCH    metoprolol  succinate (TOPROL -XL) 50 MG 24 hr tablet Take 1 tablet (50 mg total) by mouth daily. Increased from 25 mg.    sacubitril -valsartan  (ENTRESTO ) 24-26 MG Take 1 tablet by mouth 2 (two) times daily.    simvastatin  (ZOCOR ) 20 MG tablet TAKE 1 TABLET BY MOUTH AT BEDTIME    No facility-administered encounter medications on file as of 05/11/2024.    Surgical History: Past Surgical History:  Procedure Laterality Date   BREAST EXCISIONAL BIOPSY Left 1972   neg   BREAST EXCISIONAL BIOPSY Right 1980   neg   CARDIOVERSION N/A 03/15/2024   Procedure:  CARDIOVERSION;  Surgeon: Alluri, Keller BROCKS, MD;  Location: [facility] ORS;  Service: Cardiovascular;  Laterality: N/A;   COLONOSCOPY     ESOPHAGOGASTRODUODENOSCOPY (EGD) WITH PROPOFOL  N/A 04/09/2015   Procedure: ESOPHAGOGASTRODUODENOSCOPY (EGD) WITH PROPOFOL ;  Surgeon: Lamar ONEIDA Holmes, MD;  Location: [facility] ENDOSCOPY;  Service: Endoscopy;  Laterality: N/A;   skin cancer removal     TEE WITHOUT CARDIOVERSION N/A 03/15/2024   Procedure: ECHOCARDIOGRAM, TRANSESOPHAGEAL;  Surgeon: Alluri, Keller BROCKS, MD;  Location: [facility] ORS;  Service: Cardiovascular;  Laterality: N/A;    Medical History: Past Medical History:  Diagnosis Date   Actinic keratosis    Anemia    Basal cell carcinoma 12/23/2007   Upper back.   Basal cell carcinoma 05/17/2009   Right med lower leg   Basal cell carcinoma 07/06/2014   Left medial breast   Basal cell carcinoma 03/01/2015   Right medial mid pretibial   Basal cell carcinoma 07/16/2015   Right superior breast   Basal cell carcinoma 05/21/2016   Left inferior medial breast   Basal cell carcinoma 02/08/2018   Left medial breast   Basal cell carcinoma 11/01/2018   Left medial breast   Basal cell carcinoma 03/30/2019   Left med inf breast   Basal cell carcinoma 09/05/2019   Left proximal bicep   Basal cell carcinoma 12/08/2019   Right forehead. Nodular pattern.   Basal cell carcinoma 12/13/2019   Left med. breast inf. Nodular and infiltrative patterns. EDC   Basal cell carcinoma 12/13/2019   Left med. breast. sup. Nodular pattern. EDC   Basal cell carcinoma 05/14/2020   Right upper back. BCC with focal sclerosis. EDC.   Basal cell carcinoma 12/25/2020   Left inf med breast EDC   Basal cell carcinoma 07/03/2021   Right bicep EDC   Basal cell carcinoma 06/10/2022   left inferior medial breast   Cancer (HCC)    squamous cell   Glaucoma    Hyperlipidemia    Hypertension    Osteoporosis    SCC (squamous cell carcinoma) 12/16/2021   R forearm, EDC   SCC (squamous  cell carcinoma) 12/16/2021   L pretibial, EDC   SCC (squamous cell carcinoma) 04/23/2022   Left mid distal pretibial lat - EDC   SCC (squamous cell carcinoma) 04/23/2022   Left mid distal pretibial med - EDC   SCC (squamous cell carcinoma) 08/28/2022   L distal lat pretibial   SCC (squamous cell carcinoma) 08/28/2022  L proximal pretibial   SCC (squamous cell carcinoma) 12/31/2022   R lower leg anterior, EDC   SCC (squamous cell carcinoma) 12/31/2022   L lower leg anterior, EDC   SCC (squamous cell carcinoma) 12/31/2022   L chest superior, EDC   SCC (squamous cell carcinoma) 12/31/2022   L chest inferior, EDC   SCC (squamous cell carcinoma) 07/02/2023   right middle pretibial sup medial tx EDC   SCC (squamous cell carcinoma) 07/02/2023   right middle pretibial middle tx with ED&C   SCC (squamous cell carcinoma) 07/02/2023   right middle pretibial inferior tx with ED&C   SCC (squamous cell carcinoma) 07/02/2023   right lower leg inferior medial knee tx with ED&C   SCC (squamous cell carcinoma) 12/30/2023   well differentiated - right lateral lower leg - ED&C done   SCC (squamous cell carcinoma) 12/30/2023   left lateral lower leg proximal - ED&C done   SCC (squamous cell carcinoma) 12/30/2023   left lateral lower leg distal near ankle - ed&C done   SCC (squamous cell carcinoma) 03/31/2024   left dorsum lateral foot - ED&C done -   SCC (squamous cell carcinoma) 03/31/2024   left medial lower leg above ankle  ED&C done   SCC (squamous cell carcinoma) 03/31/2024   right medial calf - ED&C done   Squamous cell carcinoma of skin 12/08/2007   Left lower leg. WD SCC   Squamous cell carcinoma of skin 02/01/2008   Left distal med. pretibial. WD SCC   Squamous cell carcinoma of skin 02/01/2008   Right mid pretibial. WD SCC   Squamous cell carcinoma of skin 02/01/2008   Right mid pretibal inferior. WD SCC with superficial infiltration.   Squamous cell carcinoma of skin 04/05/2008    Left dorsum hand. WD SCC with superficial infiltration.   Squamous cell carcinoma of skin 06/15/2008   Right lower leg inferior. WD SCC   Squamous cell carcinoma of skin 06/15/2008   Right lower leg superior. SCCis hypertrophic   Squamous cell carcinoma of skin 07/05/2008   Right lat. lower leg, Right lower leg, Left lower leg   Squamous cell carcinoma of skin 08/04/2008   Right ant. lower leg, Left med. lower leg   Squamous cell carcinoma of skin 09/01/2008   Right post. lower leg   Squamous cell carcinoma of skin 10/04/2008   Left post leg   Squamous cell carcinoma of skin 12/01/2008   Left lower leg   Squamous cell carcinoma of skin 03/08/2009   Right ant. mid lower leg, Right lower leg   Squamous cell carcinoma of skin 05/17/2009   Left lower post. leg superior   Squamous cell carcinoma of skin 10/11/2009   Left lower leg. KA-pattern   Squamous cell carcinoma of skin 03/28/2010   Right lower leg   Squamous cell carcinoma of skin 03/25/2013   Right lower leg   Squamous cell carcinoma of skin 06/23/2013   Right post. lower leg. Right medial lower leg. Left medial lower leg   Squamous cell carcinoma of skin 09/09/2013   Left anterior upper arm. Right anterior lower leg. Right lat. lower leg. Right below knee lower leg   Squamous cell carcinoma of skin 12/08/2013   Right posterior lower leg sup. Right forearm   Squamous cell carcinoma of skin 03/03/2014   Right posterior leg. Right med. lower leg   Squamous cell carcinoma of skin 06/22/2014   Right to of shoulder anterior. Right top of shoulder posterior  Squamous cell carcinoma of skin 08/03/2014   Right anterior forearm   Squamous cell carcinoma of skin 09/14/2014   Right chest   Squamous cell carcinoma of skin 10/30/2014   Right calf   Squamous cell carcinoma of skin 12/14/2014   Right med. distal pretibial sup. Right med distal pretibial inf. Left proximal bicep sup. Left proximal bicep inf.   Squamous cell  carcinoma of skin 03/01/2015   Left lat proximal pretibial near knee   Squamous cell carcinoma of skin 03/29/2015   Left mid to prox. pretibial. Left mid. lat. pretibial.    Squamous cell carcinoma of skin 04/19/2015   Right mid calf. Right distal lat. pretibial   Squamous cell carcinoma of skin 05/31/2015   Right proximal med. pretibial ant. Right proximal med. pretibial posterior   Squamous cell carcinoma of skin 07/16/2015   Right upper arm inf. deltoid   Squamous cell carcinoma of skin 08/06/2015   Left distal pretibial   Squamous cell carcinoma of skin 09/12/2015   Left postauricular area   Squamous cell carcinoma of skin 12/12/2015   Left proximal pretibial x4 sites   Squamous cell carcinoma of skin 12/31/2015   Right med. lower leg above ankle sup. Right med. lower leg above ankle inf. Left inf. lat. calf med. Left inf. calf lat.   Squamous cell carcinoma of skin 01/31/2016   Right med. sup. ankle area. Right med mid proximal calf. Left dorsum foot.    Squamous cell carcinoma of skin 03/17/2016   Left post. distal calf lateral. Left post distal calf med. Left mid lat ant. thigh. Right mid lat. calf   Squamous cell carcinoma of skin 05/21/2016   Right proximal medial pretibial. Right mid med. pretibial.   Squamous cell carcinoma of skin 07/03/2016   Right mid med. pretibial. Right proximal med. pretibial   Squamous cell carcinoma of skin 08/20/2016   Right medial calf. Left distal pretibial. Right upper back paraspinal   Squamous cell carcinoma of skin 10/30/2016   Right medial calf x2   Squamous cell carcinoma of skin 12/10/2016   left mid back 6.0cm lat to spine. Left sup. lat. calf. Left inf. post. calf   Squamous cell carcinoma of skin 12/25/2016   Right medial above ankle. Right mid pretibial. Right prox. pretibial. Right prox. pretibial med.    Squamous cell carcinoma of skin 01/19/2017   Left forearm. Left ant. neck   Squamous cell carcinoma of skin 03/23/2017   Left  med. pretibial below knee   Squamous cell carcinoma of skin 04/13/2017   Right lat. sup. ankle. Right medial ankle   Squamous cell carcinoma of skin 04/27/2017   Left med. distal prox. calf. Left medial distal calf. Left lat. distal prox. calf. Left lat distal distal calf   Squamous cell carcinoma of skin 05/14/2017   Left bicep. Left dorsum mid forearm   Squamous cell carcinoma of skin 06/25/2017   Right med. distal calf x3   Squamous cell carcinoma of skin 09/02/2017   Left med. infraclavicular   Squamous cell carcinoma of skin 12/21/2017   Left prox. med. lower leg. Right forearm. Left forearm. Left bicep   Squamous cell carcinoma of skin 01/07/2018   Left dorsum foot prox. Left dorsum foot distal. Left mid to distal calf. Right chest   Squamous cell carcinoma of skin 03/08/2018   Left bicep. Right proximal dorsum forearm. Right distal med. pretibial sup. Right distal med pretibial inf.    Squamous cell carcinoma of skin  04/05/2018   Right sup. med scapula   Squamous cell carcinoma of skin 06/03/2018   Left med. ankle sup. Left med. ankle inf. Left lateral ankle.   Squamous cell carcinoma of skin 09/01/2018   Right mid lat. calf. Right lat. knee. Prox post calf   Squamous cell carcinoma of skin 11/01/2018   Left lateral ankle   Squamous cell carcinoma of skin 12/08/2018   Right lateral ankle lat malleolus   Squamous cell carcinoma of skin 01/05/2019   Right lateral elbow. Left mid medial calf. Left prox. ant. thigh. Right distal lat tricep   Squamous cell carcinoma of skin 01/19/2019   Right popliteal   Squamous cell carcinoma of skin 02/03/2019   Left mid lat calf. Left mid calf. Left dorsum lat distal foot. Left dorsum distal foot   Squamous cell carcinoma of skin 03/30/2019   Right lower leg above med ankle ant. Right lower leg above ankle sup. Right lower leg above the med ankle inf.   Squamous cell carcinoma of skin 05/12/2019   Right chest medial. Left deltoid   Squamous  cell carcinoma of skin 06/23/2019   Right ant thigh distal above knee. Left med distal thigh. Left ant thigh above knee   Squamous cell carcinoma of skin 07/20/2019   Right chest. Right dorsal hand.   Squamous cell carcinoma of skin 08/03/2019   Left distal lat pretibial. Left distal med calf. Right prox. lat calf   Squamous cell carcinoma of skin 09/05/2019   Right middle bicep. Right sup lat bicep. Right sup mid bicep. Right sup med bicep   Squamous cell carcinoma of skin 09/15/2019   Right proximal med pretibial. Left distal lat thigh   Squamous cell carcinoma of skin 04/02/2020   Right calf, bleow right medial knee sup, below right medial knee post, below right medial knee inf   Squamous cell carcinoma of skin 05/14/2020   Right ant. shoulder. Mod. Diff. SCC. EDC   Squamous cell carcinoma of skin 06/27/2020   R mid to distal pretibial - ED&C   Squamous cell carcinoma of skin 06/27/2020   Below the R knee - ED&C    Squamous cell carcinoma of skin 06/27/2020   R prox pretibial    Squamous cell carcinoma of skin 06/27/2020   L distal med calf   Squamous cell carcinoma of skin 08/06/2020   Left ant deltoid   Squamous cell carcinoma of skin 12/25/2020   Left lat ankle EDC   Squamous cell carcinoma of skin 03/27/2021   left knee, EDC   Squamous cell carcinoma of skin 03/27/2021   left lateral leg, EDC   Squamous cell carcinoma of skin 07/03/2021   Left lat neck - EDC   Squamous cell carcinoma of skin 07/03/2021   Right ant thigh - EDC   Squamous cell carcinoma of skin 07/03/2021   Right prox lat pretibial - EDC   Squamous cell carcinoma of skin 10/16/2021   right med mid calf, EDC   Squamous cell carcinoma of skin 10/16/2021   left medial prox pretibial, EDC   Squamous cell carcinoma of skin 10/16/2021   left distal pretibial, EDC   Squamous cell carcinoma of skin 03/26/2023   Left ant lat ankle EDC   Squamous cell carcinoma of skin 03/26/2023   Left medial lower leg above  ankle EDC EDC   Squamous cell carcinoma of skin 03/26/2023   Right lat ankle sup EDC   Squamous cell carcinoma of skin 03/26/2023  Right lat ankle middle EDC   Squamous cell carcinoma of skin 03/26/2023   Right lat ankle inf EDC   Squamous cell carcinoma of skin 10/15/2023   left distal pretibial, EDC   Squamous cell carcinoma of skin 10/15/2023   left proximal pretibial, EDC   Squamous cell carcinoma of skin 10/15/2023   right middle medial lower leg, EDC    Family History: Family History  Problem Relation Age of Onset   Breast cancer Sister 52   Atrial fibrillation Mother     Social History   Socioeconomic History   Marital status: Married    Spouse name: Not on file   Number of children: Not on file   Years of education: Not on file   Highest education level: Not on file  Occupational History   Not on file  Tobacco Use   Smoking status: Never   Smokeless tobacco: Never  Vaping Use   Vaping status: Never Used  Substance and Sexual Activity   Alcohol use: No   Drug use: No   Sexual activity: Not on file  Other Topics Concern   Not on file  Social History Narrative   Not on file   Social Drivers of Health   Financial Resource Strain: Not on file  Food Insecurity: No Food Insecurity (03/15/2024)   Hunger Vital Sign    Worried About Running Out of Food in the Last Year: Never true    Ran Out of Food in the Last Year: Never true  Transportation Needs: No Transportation Needs (03/15/2024)   PRAPARE - Administrator, Civil Service (Medical): No    Lack of Transportation (Non-Medical): No  Physical Activity: Not on file  Stress: Not on file  Social Connections: Socially Integrated (03/15/2024)   Social Connection and Isolation Panel    Frequency of Communication with Friends and Family: Three times a week    Frequency of Social Gatherings with Friends and Family: Three times a week    Attends Religious Services: More than 4 times per year    Active  Member of Clubs or Organizations: Yes    Attends Banker Meetings: 1 to 4 times per year    Marital Status: Married  Catering manager Violence: Not At Risk (03/15/2024)   Humiliation, Afraid, Rape, and Kick questionnaire    Fear of Current or Ex-Partner: No    Emotionally Abused: No    Physically Abused: No    Sexually Abused: No      Review of Systems  Constitutional:  Negative for activity change, appetite change, chills, fatigue, fever and unexpected weight change.  HENT: Negative.  Negative for congestion, ear pain, rhinorrhea, sore throat and trouble swallowing.   Eyes: Negative.   Respiratory: Negative.  Negative for cough, chest tightness, shortness of breath and wheezing.   Cardiovascular: Negative.  Negative for chest pain.  Gastrointestinal: Negative.  Negative for abdominal pain, blood in stool, constipation, diarrhea, nausea and vomiting.  Endocrine: Negative.   Genitourinary: Negative.  Negative for difficulty urinating, dysuria, frequency, hematuria and urgency.  Musculoskeletal: Negative.  Negative for arthralgias, back pain, joint swelling, myalgias and neck pain.  Skin: Negative.  Negative for rash and wound.  Allergic/Immunologic: Negative.  Negative for immunocompromised state.  Neurological: Negative.  Negative for dizziness, seizures, numbness and headaches.  Hematological: Negative.   Psychiatric/Behavioral: Negative.  Negative for behavioral problems, self-injury and suicidal ideas. The patient is not nervous/anxious.     Vital Signs: BP 136/88  Pulse 65   Temp (!) 97.2 F (36.2 C)   Resp 16   Ht 5' 2 (1.575 m)   Wt 107 lb 6.4 oz (48.7 kg)   SpO2 95%   BMI 19.64 kg/m    Physical Exam Vitals reviewed.  Constitutional:      General: She is awake. She is not in acute distress.    Appearance: Normal appearance. She is well-developed, well-groomed and normal weight. She is not ill-appearing or diaphoretic.  HENT:     Head: Normocephalic  and atraumatic.     Right Ear: Tympanic membrane, ear canal and external ear normal.     Left Ear: Tympanic membrane, ear canal and external ear normal.     Nose: Nose normal. No congestion or rhinorrhea.     Mouth/Throat:     Lips: Pink.     Mouth: Mucous membranes are moist.     Pharynx: Oropharynx is clear. Uvula midline. No oropharyngeal exudate or posterior oropharyngeal erythema.  Eyes:     General: Lids are normal. Vision grossly intact. Gaze aligned appropriately. No scleral icterus.       Right eye: No discharge.        Left eye: No discharge.     Extraocular Movements: Extraocular movements intact.     Conjunctiva/sclera: Conjunctivae normal.     Pupils: Pupils are equal, round, and reactive to light.     Funduscopic exam:    Right eye: Red reflex present.        Left eye: Red reflex present. Neck:     Thyroid : No thyromegaly.     Vascular: No JVD.     Trachea: Trachea and phonation normal. No tracheal deviation.  Cardiovascular:     Rate and Rhythm: Normal rate and regular rhythm.     Pulses: Normal pulses.     Heart sounds: Normal heart sounds, S1 normal and S2 normal. No murmur heard.    No friction rub. No gallop.  Pulmonary:     Effort: Pulmonary effort is normal. No accessory muscle usage or respiratory distress.     Breath sounds: Normal breath sounds. No stridor. No wheezing or rales.  Chest:     Chest wall: No tenderness.  Breasts:    Breasts are symmetrical.     Right: Normal. No swelling, bleeding, inverted nipple, mass, nipple discharge, skin change or tenderness.     Left: Normal. No swelling, bleeding, inverted nipple, mass, nipple discharge, skin change or tenderness.  Abdominal:     General: Bowel sounds are normal. There is no distension.     Palpations: Abdomen is soft. There is no mass.     Tenderness: There is no abdominal tenderness. There is no guarding or rebound.  Musculoskeletal:        General: No tenderness or deformity. Normal range of  motion.     Cervical back: Normal range of motion and neck supple.     Right lower leg: No edema.     Left lower leg: No edema.  Lymphadenopathy:     Cervical: No cervical adenopathy.     Upper Body:     Right upper body: No supraclavicular, axillary or pectoral adenopathy.     Left upper body: No supraclavicular, axillary or pectoral adenopathy.  Skin:    General: Skin is warm and dry.     Capillary Refill: Capillary refill takes less than 2 seconds.     Coloration: Skin is not pale.     Findings: No erythema or  rash.  Neurological:     Mental Status: She is alert and oriented to person, place, and time.     Cranial Nerves: No cranial nerve deficit.     Motor: No abnormal muscle tone.     Coordination: Coordination normal.     Gait: Gait normal.     Deep Tendon Reflexes: Reflexes are normal and symmetric.  Psychiatric:        Mood and Affect: Mood and affect normal.        Behavior: Behavior normal. Behavior is cooperative.        Thought Content: Thought content normal.        Judgment: Judgment normal.        Assessment/Plan: 1. Encounter for subsequent annual wellness visit (AWV) in Medicare patient (Primary) Age-appropriate preventive screenings and vaccinations discussed. Routine labs for health maintenance results reviewed and additional labs ordered. PHM updated.    2. Elevated liver enzymes Routine lab ordered  - Hepatic function panel  3. B12 deficiency Routine lab ordered  - B12 and Folate Panel  4. Osteopenia of multiple sites Dexa scan ordered   5. 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 Encounter for screening mammogram for malignant neoplasm of breast Routine mammogram ordered  - MM 3D SCREENING MAMMOGRAM BILATERAL BREAST; Future   General Counseling: Aldora verbalizes understanding of the findings of todays visit and agrees with plan of treatment. I have discussed any further diagnostic evaluation that may  be needed or ordered today. We also reviewed her medications today. she has been encouraged to call the office with any questions or concerns that should arise related to todays visit.    Orders Placed This Encounter  Procedures   MM 3D SCREENING MAMMOGRAM BILATERAL BREAST   DG Bone Density   B12 and Folate Panel   Hepatic function panel    No orders of the defined types were placed in this encounter.   Return in about 6 months (around 11/08/2024) for F/U, Jamarques Pinedo PCP.   Total time spent:30 Minutes Time spent includes review of chart, medications, test results, and follow up plan with the patient.   [place] Controlled Substance Database was reviewed by me.  This patient was seen by Mardy Maxin, FNP-C in collaboration with Dr. Sigrid Bathe as a part of collaborative care agreement.  Tamya Denardo R. Maxin, MSN, FNP-C Internal medicine

## 2024-05-12 ENCOUNTER — Other Ambulatory Visit: Payer: Self-pay | Admitting: Dermatology

## 2024-05-12 DIAGNOSIS — E538 Deficiency of other specified B group vitamins: Secondary | ICD-10-CM | POA: Diagnosis not present

## 2024-05-12 DIAGNOSIS — L299 Pruritus, unspecified: Secondary | ICD-10-CM

## 2024-05-12 DIAGNOSIS — R748 Abnormal levels of other serum enzymes: Secondary | ICD-10-CM | POA: Diagnosis not present

## 2024-05-13 LAB — B12 AND FOLATE PANEL
Folate: 4.7 ng/mL (ref >3.0)
Vitamin B-12: 1326 pg/mL — ABNORMAL HIGH (ref 232–1245)

## 2024-05-13 LAB — HEPATIC FUNCTION PANEL
ALT: 9 IU/L (ref 0–32)
AST: 15 IU/L (ref 0–40)
Albumin: 4 g/dL (ref 3.8–4.8)
Alkaline Phosphatase: 64 IU/L (ref 49–135)
Bilirubin Total: 0.4 mg/dL (ref 0.0–1.2)
Bilirubin, Direct: 0.16 mg/dL (ref 0.00–0.40)
Total Protein: 6.7 g/dL (ref 6.0–8.5)

## 2024-05-15 ENCOUNTER — Encounter: Payer: Self-pay | Admitting: Nurse Practitioner

## 2024-06-23 ENCOUNTER — Ambulatory Visit: Admitting: Dermatology

## 2024-06-23 DIAGNOSIS — C4442 Squamous cell carcinoma of skin of scalp and neck: Secondary | ICD-10-CM | POA: Diagnosis not present

## 2024-06-23 DIAGNOSIS — W908XXA Exposure to other nonionizing radiation, initial encounter: Secondary | ICD-10-CM | POA: Diagnosis not present

## 2024-06-23 DIAGNOSIS — Z79899 Other long term (current) drug therapy: Secondary | ICD-10-CM

## 2024-06-23 DIAGNOSIS — C44529 Squamous cell carcinoma of skin of other part of trunk: Secondary | ICD-10-CM

## 2024-06-23 DIAGNOSIS — L578 Other skin changes due to chronic exposure to nonionizing radiation: Secondary | ICD-10-CM | POA: Diagnosis not present

## 2024-06-23 DIAGNOSIS — L299 Pruritus, unspecified: Secondary | ICD-10-CM | POA: Diagnosis not present

## 2024-06-23 DIAGNOSIS — L821 Other seborrheic keratosis: Secondary | ICD-10-CM

## 2024-06-23 DIAGNOSIS — Z7189 Other specified counseling: Secondary | ICD-10-CM

## 2024-06-23 DIAGNOSIS — C44622 Squamous cell carcinoma of skin of right upper limb, including shoulder: Secondary | ICD-10-CM | POA: Diagnosis not present

## 2024-06-23 DIAGNOSIS — D492 Neoplasm of unspecified behavior of bone, soft tissue, and skin: Secondary | ICD-10-CM

## 2024-06-23 MED ORDER — MUPIROCIN 2 % EX OINT: TOPICAL_OINTMENT | CUTANEOUS | 11 refills | Status: AC

## 2024-06-23 NOTE — Progress Notes (Signed)
 Follow-Up Visit   Subjective  Joann Warren is a 78 y.o. female who presents for the following: The patient has spots, moles and lesions to be evaluated, some may be new or changing and the patient may have concern these could be cancer.    The following portions of the chart were reviewed this encounter and updated as appropriate: medications, allergies, medical history  Review of Systems:  No other skin or systemic complaints except as noted in HPI or Assessment and Plan.  Objective  Well appearing patient in no apparent distress; mood and affect are within normal limits.    A focused examination was performed of the following areas: chest, back, right arm   Relevant exam findings are noted in the Assessment and Plan.  superior sternum 1.5 cm hypertrophic papule  Right sup lateral elbow 2.1 cm hypertrophic papule  right infrascapular 1.5 cm hypertrophic papule  right post neck 1.2 cm hypertrophic papule                      Assessment & Plan   SEBORRHEIC KERATOSIS - Stuck-on, waxy, tan-brown papules and/or plaques  - Benign-appearing - Discussed benign etiology and prognosis. - Observe - Call for any changes  ACTINIC DAMAGE - chronic, secondary to cumulative UV radiation exposure/sun exposure over time - diffuse scaly erythematous macules with underlying dyspigmentation - Recommend daily broad spectrum sunscreen SPF 30+ to sun-exposed areas, reapply every 2 hours as needed.  - Recommend staying in the shade or wearing long sleeves, sun glasses (UVA+UVB protection) and wide brim hats (4-inch brim around the entire circumference of the hat). - Call for new or changing lesions.     PRURITUS We do not recommend Hydroxyzine  daily due to side effects- Hydroxyzine  is an anticholinergic medication, which means it blocks the action of acetylcholine, a neurotransmitter involved in memory and cognition. By inhibiting acetylcholine, hydroxyzine  can impair  brain function and lead to memory problems.   Recommend otc Antihistamines   For Hives (urticaria); dermatographism or Itch: Start non sedating antihistamine (either Allegra 180mg , or Claritin 10mg , or Zyrtec 10mg ) daily.  All these are non-prescription (Over the Counter).   Start out with 1 pill a day.   After a week if not improving may increase to 2 pills a day.   After another week if not improving may increase to 3 pills a day.   After another week if still not improving may take up to 4 pills a day. Stay at highest dose that keeps condition controlled, but only up to 4 pills a day. Stay at the controlling dose for at least 2 weeks. Contact office if taking 4 pills of antihistamine a day for at least 2 weeks without control of condition as other options may be available.     NEOPLASM OF SKIN (4) superior sternum Epidermal / dermal shaving  Lesion diameter (cm):  1.5  Destruction of lesion Complexity: extensive   Destruction method: electrodesiccation and curettage   Informed consent: discussed and consent obtained   Timeout:  patient name, date of birth, surgical site, and procedure verified Procedure prep:  Patient was prepped and draped in usual sterile fashion Prep type:  Isopropyl alcohol Anesthesia: the lesion was anesthetized in a standard fashion   Anesthetic:  1% lidocaine  w/ epinephrine 1-100,000 buffered w/ 8.4% NaHCO3 Curettage performed in three different directions: Yes   Electrodesiccation performed over the curetted area: Yes   Lesion length (cm):  1.5 Lesion width (cm):  1.5 Margin per side (cm):  0.2 Final wound size (cm):  1.9 Hemostasis achieved with:  pressure, aluminum chloride and electrodesiccation Outcome: patient tolerated procedure well with no complications   Post-procedure details: sterile dressing applied and wound care instructions given   Dressing type: bandage and petrolatum   Additional details:  Treated with EDC  Specimen 1 -  Surgical pathology Differential Diagnosis: R/O SCC  Check Margins: No Right sup lateral elbow Epidermal / dermal shaving  Lesion diameter (cm):  2.1  Destruction of lesion Complexity: extensive   Destruction method: electrodesiccation and curettage   Informed consent: discussed and consent obtained   Timeout:  patient name, date of birth, surgical site, and procedure verified Procedure prep:  Patient was prepped and draped in usual sterile fashion Prep type:  Isopropyl alcohol Anesthesia: the lesion was anesthetized in a standard fashion   Anesthetic:  1% lidocaine  w/ epinephrine 1-100,000 buffered w/ 8.4% NaHCO3 Curettage performed in three different directions: Yes   Electrodesiccation performed over the curetted area: Yes   Lesion length (cm):  2.1 Lesion width (cm):  2.1 Margin per side (cm):  0.2 Final wound size (cm):  2.5 Hemostasis achieved with:  pressure, aluminum chloride and electrodesiccation Outcome: patient tolerated procedure well with no complications   Post-procedure details: sterile dressing applied and wound care instructions given   Dressing type: bandage and petrolatum   Additional details:  Treated with EDC   Specimen 2 - Surgical pathology Differential Diagnosis: R/O SCC  Check Margins: No 2.1 cm right infrascapular Epidermal / dermal shaving  Lesion diameter (cm):  1.5 Informed consent: discussed and consent obtained   Timeout: patient name, date of birth, surgical site, and procedure verified   Procedure prep:  Patient was prepped and draped in usual sterile fashion Prep type:  Isopropyl alcohol Anesthesia: the lesion was anesthetized in a standard fashion   Anesthetic:  1% lidocaine  w/ epinephrine 1-100,000 buffered w/ 8.4% NaHCO3 Hemostasis achieved with: pressure, aluminum chloride and electrodesiccation   Outcome: patient tolerated procedure well   Post-procedure details: sterile dressing applied and wound care instructions given    Dressing type: bandage and petrolatum    Destruction of lesion  Destruction method: electrodesiccation and curettage   Informed consent: discussed and consent obtained   Timeout:  patient name, date of birth, surgical site, and procedure verified Curettage performed in three different directions: Yes   Electrodesiccation performed over the curetted area: Yes   Lesion length (cm):  1.5 Lesion width (cm):  1.5 Margin per side (cm):  0.2 Final wound size (cm):  1.9 Hemostasis achieved with:  pressure, aluminum chloride and electrodesiccation Outcome: patient tolerated procedure well with no complications   Post-procedure details: wound care instructions given   Additional details:  Treated with EDC   Specimen 3 - Surgical pathology Differential Diagnosis: R/O SCC  Check Margins: No right post neck Epidermal / dermal shaving  Lesion diameter (cm):  1.2 Informed consent: discussed and consent obtained   Timeout: patient name, date of birth, surgical site, and procedure verified   Patient was prepped and draped in usual sterile fashion: area prepped with alcohol. Anesthesia: the lesion was anesthetized in a standard fashion   Anesthetic:  1% lidocaine  w/ epinephrine 1-100,000 local infiltration Instrument used: flexible razor blade   Hemostasis achieved with: pressure, aluminum chloride and electrodesiccation   Outcome: patient tolerated procedure well   Post-procedure details: wound care instructions given   Post-procedure details comment:  Ointment and a small bandage applied  Destruction of lesion  Destruction method: electrodesiccation and curettage   Informed consent: discussed and consent obtained   Timeout:  patient name, date of birth, surgical site, and procedure verified Curettage performed in three different directions: Yes   Electrodesiccation performed over the curetted area: Yes   Lesion length (cm):  1.2 Lesion width (cm):  1.2 Margin per side (cm):   0.2 Final wound size (cm):  1.6 Hemostasis achieved with:  pressure, aluminum chloride and electrodesiccation Outcome: patient tolerated procedure well with no complications   Post-procedure details: wound care instructions given   Additional details:  Treated with EDC   Specimen 4 - Surgical pathology Differential Diagnosis: R/O SCC  Check Margins: No Mupirocin  ointment apply qd-bid during each bandage change  Return in 3 months (on 09/23/2024) for biopsies .  IFay Kirks, CMA, am acting as scribe for Alm Rhyme, MD .   Documentation: I have reviewed the above documentation for accuracy and completeness, and I agree with the above.  Alm Rhyme, MD

## 2024-06-23 NOTE — Patient Instructions (Addendum)
 For Hives (urticaria); dermatographism or Itch: Start non sedating antihistamine (either Allegra 180mg , or Claritin 10mg , or Zyrtec 10mg ) daily.  All these are non-prescription (Over the Counter).   Start out with 1 pill a day.   After a week if not improving may increase to 2 pills a day.   After another week if not improving may increase to 3 pills a day.   After another week if still not improving may take up to 4 pills a day. Stay at highest dose that keeps condition controlled, but only up to 4 pills a day. Stay at the controlling dose for at least 2 weeks. Contact office if taking 4 pills of antihistamine a day for at least 2 weeks without control of condition as other options may be available.         Wound Care Instructions  Cleanse wound gently with soap and water once a day then pat dry with clean gauze. Apply a thin coat of Petrolatum (petroleum jelly, Vaseline) over the wound (unless you have an allergy to this). We recommend that you use a new, sterile tube of Vaseline. Do not pick or remove scabs. Do not remove the yellow or white healing tissue from the base of the wound.  Cover the wound with fresh, clean, nonstick gauze and secure with paper tape. You may use Band-Aids in place of gauze and tape if the wound is small enough, but would recommend trimming much of the tape off as there is often too much. Sometimes Band-Aids can irritate the skin.  You should call the office for your biopsy report after 1 week if you have not already been contacted.  If you experience any problems, such as abnormal amounts of bleeding, swelling, significant bruising, significant pain, or evidence of infection, please call the office immediately.  FOR ADULT SURGERY PATIENTS: If you need something for pain relief you may take 1 extra strength Tylenol  (acetaminophen ) AND 2 Ibuprofen  (200mg  each) together every 4 hours as needed for pain. (do not take these if you are allergic to them or if  you have a reason you should not take them.) Typically, you may only need pain medication for 1 to 3 days.          Due to recent changes in healthcare laws, you may see results of your pathology and/or laboratory studies on MyChart before the doctors have had a chance to review them. We understand that in some cases there may be results that are confusing or concerning to you. Please understand that not all results are received at the same time and often the doctors may need to interpret multiple results in order to provide you with the best plan of care or course of treatment. Therefore, we ask that you please give us  2 business days to thoroughly review all your results before contacting the office for clarification. Should we see a critical lab result, you will be contacted sooner.   If You Need Anything After Your Visit  If you have any questions or concerns for your doctor, please call our main line at [phone] and press option 4 to reach your doctor's medical assistant. If no one answers, please leave a voicemail as directed and we will return your call as soon as possible. Messages left after 4 pm will be answered the following business day.   You may also send us  a message via MyChart. We typically respond to MyChart messages within 1-2 business days.  For prescription refills, please  ask your pharmacy to contact our office. Our fax number is [phone].  If you have an urgent issue when the clinic is closed that cannot wait until the next business day, you can page your doctor at the number below.    Please note that while we do our best to be available for urgent issues outside of office hours, we are not available 24/7.   If you have an urgent issue and are unable to reach us , you may choose to seek medical care at your doctor's office, retail clinic, urgent care center, or emergency room.  If you have a medical emergency, please immediately call 911 or go to the emergency  department.  Pager Numbers  - Dr. Hester: [phone]  - Dr. Jackquline: [phone]  - Dr. Claudene: [phone]   - Dr. Raymund: [phone]  In the event of inclement weather, please call our main line at [phone] for an update on the status of any delays or closures.  Dermatology Medication Tips: Please keep the boxes that topical medications come in in order to help keep track of the instructions about where and how to use these. Pharmacies typically print the medication instructions only on the boxes and not directly on the medication tubes.   If your medication is too expensive, please contact our office at [phone] option 4 or send us  a message through MyChart.   We are unable to tell what your co-pay for medications will be in advance as this is different depending on your insurance coverage. However, we may be able to find a substitute medication at lower cost or fill out paperwork to get insurance to cover a needed medication.   If a prior authorization is required to get your medication covered by your insurance company, please allow us  1-2 business days to complete this process.  Drug prices often vary depending on where the prescription is filled and some pharmacies may offer cheaper prices.  The website www.goodrx.com contains coupons for medications through different pharmacies. The prices here do not account for what the cost may be with help from insurance (it may be cheaper with your insurance), but the website can give you the price if you did not use any insurance.  - You can print the associated coupon and take it with your prescription to the pharmacy.  - You may also stop by our office during regular business hours and pick up a GoodRx coupon card.  - If you need your prescription sent electronically to a different pharmacy, notify our office through [facility] or by phone at [phone] option 4.     Si Usted Necesita Algo Despus de Su  Visita  Tambin puede enviarnos un mensaje a travs de Clinical Cytogeneticist. Por lo general respondemos a los mensajes de MyChart en el transcurso de 1 a 2 das hbiles.  Para renovar recetas, por favor pida a su farmacia que se ponga en contacto con nuestra oficina. Randi lakes de fax es [place] [phone].  Si tiene un asunto urgente cuando la clnica est cerrada y que no puede esperar hasta el siguiente da hbil, puede llamar/localizar a su doctor(a) al nmero que aparece a continuacin.   Por favor, tenga en cuenta que aunque hacemos todo lo posible para estar disponibles para asuntos urgentes fuera del horario de [place], no estamos disponibles las 24 horas del da, los 7 [address]  [address] de la [place].   Si tiene un problema urgente y no puede comunicarse con nosotros, puede optar por buscar atencin mdica  en el consultorio de su doctor(a), en una clnica privada, en un centro de atencin urgente o en una sala de emergencias.  Si tiene engineer, drilling, por favor llame inmediatamente al 911 o vaya a la sala de emergencias.  Nmeros de bper  - Dr. Hester: [phone]  - Dra. Jackquline: [phone]  - Dr. Claudene: [phone]  - Dra. Kitts: [phone]  En caso de inclemencias del [place], por favor llame a nuestra lnea principal al [phone] para una actualizacin sobre el estado de cualquier retraso o cierre.  Consejos para la medicacin en dermatologa: Por favor, guarde las cajas en las que vienen los medicamentos de uso tpico para ayudarle a seguir las instrucciones sobre dnde y cmo usarlos. Las farmacias generalmente imprimen las instrucciones del medicamento slo en las cajas y no directamente en los tubos del [place].   Si su medicamento es muy caro, por favor, pngase en contacto con landry rieger llamando al [phone] y presione la opcin 4 o envenos un mensaje a travs de Clinical Cytogeneticist.   No podemos decirle cul ser su copago por los medicamentos por adelantado ya que esto  es diferente dependiendo de la cobertura de su seguro. Sin embargo, es posible que podamos encontrar un medicamento sustituto a audiological scientist un formulario para que el seguro cubra el medicamento que se considera necesario.   Si se requiere una autorizacin previa para que su compaa de seguros cubra su medicamento, por favor permtanos de 1 a 2 das hbiles para completar este proceso.  Los precios de los medicamentos varan con frecuencia dependiendo del environmental consultant de dnde se surte la receta y alguna farmacias pueden ofrecer precios ms baratos.  El sitio web www.goodrx.com tiene cupones para medicamentos de health and safety inspector. Los precios aqu no tienen en cuenta lo que podra costar con la ayuda del seguro (puede ser ms barato con su seguro), pero el sitio web puede darle el precio si no utiliz tourist information centre manager.  - Puede imprimir el cupn correspondiente y llevarlo con su receta a la farmacia.  - Tambin puede pasar por nuestra oficina durante el horario de atencin regular y education officer, museum una tarjeta de cupones de GoodRx.  - Si necesita que su receta se enve electrnicamente a una farmacia diferente, informe a nuestra oficina a travs de MyChart de [place] o por telfono llamando al [phone] y presione la opcin 4.

## 2024-06-27 ENCOUNTER — Ambulatory Visit: Payer: Self-pay | Admitting: Dermatology

## 2024-06-27 LAB — SURGICAL PATHOLOGY

## 2024-06-28 ENCOUNTER — Encounter: Payer: Self-pay | Admitting: Dermatology

## 2024-06-28 NOTE — Telephone Encounter (Signed)
Biopsy results discussed with patient  

## 2024-06-28 NOTE — Telephone Encounter (Addendum)
 Tried calling patient regarding results. No answer. Lm for patient to return call.   ----- Message from Joann Warren sent at 06/27/2024  5:52 PM EST ----- FINAL DIAGNOSIS        1. Skin, superior sternum :       WELL DIFFERENTIATED SQUAMOUS CELL CARCINOMA, ACANTHOLYTIC (ADENOID) VARIANT        2. Skin, right sup lateral elbow :       WELL DIFFERENTIATED SQUAMOUS CELL CARCINOMA WITH SUPERFICIAL INFILTRATION        3. Skin, right infrascapular :       SQUAMOUS CELL CARCINOMA, KERATOACANTHOMA TYPE        4. Skin, right post neck :       WELL DIFFERENTIATED SQUAMOUS CELL CARCINOMA    1,2,3,4 - All Four Cancer = SCC All four already treated Recheck next visit ----- Message ----- From: Interface, Lab In [facility] [place] Sent: 06/27/2024   4:58 PM EST To: Joann JAYSON Rhyme, MD

## 2024-06-28 NOTE — Telephone Encounter (Signed)
-----   Message from Alm Rhyme sent at 06/27/2024  5:52 PM EST ----- FINAL DIAGNOSIS        1. Skin, superior sternum :       WELL DIFFERENTIATED SQUAMOUS CELL CARCINOMA, ACANTHOLYTIC (ADENOID) VARIANT        2. Skin, right sup lateral elbow :       WELL DIFFERENTIATED SQUAMOUS CELL CARCINOMA WITH SUPERFICIAL INFILTRATION        3. Skin, right infrascapular :       SQUAMOUS CELL CARCINOMA, KERATOACANTHOMA TYPE        4. Skin, right post neck :       WELL DIFFERENTIATED SQUAMOUS CELL CARCINOMA    1,2,3,4 - All Four Cancer = SCC All four already treated Recheck next visit ----- Message ----- From: Interface, Lab In Three [place] Seven Sent: 06/27/2024   4:58 PM EST To: Alm JAYSON Rhyme, MD

## 2024-07-07 ENCOUNTER — Telehealth: Payer: Self-pay

## 2024-07-07 NOTE — Telephone Encounter (Signed)
 Patient advised of information per Dr. Hester. aw

## 2024-07-07 NOTE — Telephone Encounter (Signed)
 At last office visit patient decided to d/c Hydroxyzine  due to your recommendations.   Patient has called asked for a RF due to itching.

## 2024-07-15 ENCOUNTER — Other Ambulatory Visit: Payer: Self-pay | Admitting: Nurse Practitioner

## 2024-07-15 DIAGNOSIS — M810 Age-related osteoporosis without current pathological fracture: Secondary | ICD-10-CM

## 2024-07-22 DIAGNOSIS — I5020 Unspecified systolic (congestive) heart failure: Secondary | ICD-10-CM | POA: Diagnosis not present

## 2024-07-26 DIAGNOSIS — I4819 Other persistent atrial fibrillation: Secondary | ICD-10-CM | POA: Diagnosis not present

## 2024-07-26 DIAGNOSIS — I272 Pulmonary hypertension, unspecified: Secondary | ICD-10-CM | POA: Diagnosis not present

## 2024-07-26 DIAGNOSIS — I10 Essential (primary) hypertension: Secondary | ICD-10-CM | POA: Diagnosis not present

## 2024-07-26 DIAGNOSIS — I5020 Unspecified systolic (congestive) heart failure: Secondary | ICD-10-CM | POA: Diagnosis not present

## 2024-07-26 DIAGNOSIS — I340 Nonrheumatic mitral (valve) insufficiency: Secondary | ICD-10-CM | POA: Diagnosis not present

## 2024-08-02 ENCOUNTER — Ambulatory Visit
Admission: RE | Admit: 2024-08-02 | Discharge: 2024-08-02 | Disposition: A | Source: Ambulatory Visit | Attending: Nurse Practitioner

## 2024-08-02 DIAGNOSIS — E2839 Other primary ovarian failure: Secondary | ICD-10-CM

## 2024-08-02 DIAGNOSIS — Z1231 Encounter for screening mammogram for malignant neoplasm of breast: Secondary | ICD-10-CM

## 2024-08-22 ENCOUNTER — Ambulatory Visit: Payer: Self-pay | Admitting: Nurse Practitioner

## 2024-08-22 NOTE — Progress Notes (Signed)
 Bone density scan is similar to her last scan in 2020. She has not been taking alendronate  for 5 years so we need to stop the medication since she has completed the 5 year course. We can repeat the scan in 2 years. Continue an over the counter calcium supplement with vitamin D  Calcium amount should be 600 mg daily.  Continue weight bearing exercise such as walking daily.

## 2024-08-23 ENCOUNTER — Other Ambulatory Visit: Payer: Self-pay

## 2024-08-23 NOTE — Progress Notes (Signed)
 Pt advised bone scan result that stopped fosamax  and take otc calcium 600 mg  with vitamin D 

## 2024-09-20 ENCOUNTER — Telehealth: Payer: Self-pay | Admitting: Nurse Practitioner

## 2024-09-20 NOTE — Telephone Encounter (Signed)
 2025 office notes faxed to [facility]; [phone]

## 2024-09-22 ENCOUNTER — Ambulatory Visit: Admitting: Dermatology

## 2024-11-02 ENCOUNTER — Ambulatory Visit: Admitting: Dermatology

## 2024-11-08 ENCOUNTER — Ambulatory Visit: Admitting: Nurse Practitioner

## 2025-05-12 ENCOUNTER — Ambulatory Visit: Admitting: Nurse Practitioner
